# Patient Record
Sex: Male | Born: 1965
Health system: Southern US, Community
[De-identification: ages and names within clinical notes are randomized; demographics above are authoritative.]

## PROBLEM LIST (undated history)

## (undated) ENCOUNTER — Telehealth
Attending: Student in an Organized Health Care Education/Training Program | Primary: Student in an Organized Health Care Education/Training Program

## (undated) ENCOUNTER — Encounter
Attending: Student in an Organized Health Care Education/Training Program | Primary: Student in an Organized Health Care Education/Training Program

## (undated) ENCOUNTER — Telehealth

## (undated) ENCOUNTER — Encounter

## (undated) ENCOUNTER — Ambulatory Visit: Payer: MEDICARE

## (undated) ENCOUNTER — Ambulatory Visit: Attending: Pharmacist | Primary: Pharmacist

## (undated) ENCOUNTER — Ambulatory Visit
Payer: MEDICARE | Attending: Rehabilitative and Restorative Service Providers" | Primary: Rehabilitative and Restorative Service Providers"

## (undated) ENCOUNTER — Ambulatory Visit

## (undated) ENCOUNTER — Inpatient Hospital Stay

## (undated) ENCOUNTER — Ambulatory Visit: Payer: Medicare (Managed Care)

## (undated) ENCOUNTER — Ambulatory Visit
Payer: MEDICARE | Attending: Student in an Organized Health Care Education/Training Program | Primary: Student in an Organized Health Care Education/Training Program

## (undated) ENCOUNTER — Telehealth: Attending: Nurse Practitioner | Primary: Nurse Practitioner

## (undated) ENCOUNTER — Encounter: Attending: Internal Medicine | Primary: Internal Medicine

## (undated) ENCOUNTER — Ambulatory Visit: Payer: MEDICARE | Attending: Family | Primary: Family

## (undated) ENCOUNTER — Ambulatory Visit: Payer: MEDICARE | Attending: Internal Medicine | Primary: Internal Medicine

## (undated) ENCOUNTER — Ambulatory Visit: Attending: Internal Medicine | Primary: Internal Medicine

## (undated) ENCOUNTER — Ambulatory Visit: Payer: MEDICARE | Attending: Neurology | Primary: Neurology

## (undated) DIAGNOSIS — E119 Type 2 diabetes mellitus without complications: Secondary | ICD-10-CM

## (undated) DIAGNOSIS — M1990 Unspecified osteoarthritis, unspecified site: Secondary | ICD-10-CM

## (undated) DIAGNOSIS — I639 Cerebral infarction, unspecified: Secondary | ICD-10-CM

## (undated) DIAGNOSIS — F419 Anxiety disorder, unspecified: Secondary | ICD-10-CM

## (undated) DIAGNOSIS — F32A Depression, unspecified: Secondary | ICD-10-CM

## (undated) HISTORY — DX: Unspecified osteoarthritis, unspecified site: M19.90

## (undated) HISTORY — DX: Cerebral infarction, unspecified: I63.9

## (undated) HISTORY — PX: TIBIA FRACTURE SURGERY: SHX806

## (undated) HISTORY — PX: TONSILLECTOMY: SUR1361

## (undated) HISTORY — DX: Anxiety disorder, unspecified: F41.9

## (undated) HISTORY — DX: Depression, unspecified: F32.A

## (undated) HISTORY — DX: Type 2 diabetes mellitus without complications: E11.9

---

## 2009-06-09 ENCOUNTER — Ambulatory Visit: Payer: Self-pay | Admitting: Internal Medicine

## 2011-10-29 ENCOUNTER — Inpatient Hospital Stay: Payer: Self-pay | Admitting: Psychiatry

## 2011-10-29 LAB — COMPREHENSIVE METABOLIC PANEL
Albumin: 3.8 g/dL (ref 3.4–5.0)
Alkaline Phosphatase: 69 U/L (ref 50–136)
Anion Gap: 8 (ref 7–16)
BUN: 11 mg/dL (ref 7–18)
Bilirubin,Total: 0.4 mg/dL (ref 0.2–1.0)
Calcium, Total: 8.9 mg/dL (ref 8.5–10.1)
Co2: 26 mmol/L (ref 21–32)
Creatinine: 0.96 mg/dL (ref 0.60–1.30)
EGFR (Non-African Amer.): >60
Glucose: 115 mg/dL — ABNORMAL HIGH (ref 65–99)
Osmolality: 274 (ref 275–301)
Potassium: 4 mmol/L (ref 3.5–5.1)
Sodium: 137 mmol/L (ref 136–145)
Total Protein: 7.6 g/dL (ref 6.4–8.2)

## 2011-10-29 LAB — URINALYSIS, COMPLETE
Bacteria: NONE SEEN
Glucose,UR: NEGATIVE mg/dL (ref 0–75)
Ketone: NEGATIVE
Ph: 6 (ref 4.5–8.0)
Specific Gravity: 1.013 (ref 1.003–1.030)
WBC UR: NONE SEEN /HPF (ref 0–5)

## 2011-10-29 LAB — CBC
HCT: 46.2 % (ref 40.0–52.0)
HGB: 16.2 g/dL (ref 13.0–18.0)
MCH: 31.5 pg (ref 26.0–34.0)
MCHC: 35.1 g/dL (ref 32.0–36.0)
MCV: 90 fL (ref 80–100)

## 2011-10-29 LAB — DRUG SCREEN, URINE
Benzodiazepine, Ur Scrn: NEGATIVE (ref ?–200)
Cannabinoid 50 Ng, Ur ~~LOC~~: POSITIVE (ref ?–50)
MDMA (Ecstasy)Ur Screen: NEGATIVE (ref ?–500)
Methadone, Ur Screen: NEGATIVE (ref ?–300)
Phencyclidine (PCP) Ur S: NEGATIVE (ref ?–25)

## 2011-10-29 LAB — ETHANOL
Ethanol %: <0.003 % (ref 0.000–0.080)
Ethanol: <3 mg/dL

## 2011-10-29 LAB — TSH: Thyroid Stimulating Horm: 0.8 u[IU]/mL

## 2013-01-20 DIAGNOSIS — M069 Rheumatoid arthritis, unspecified: Secondary | ICD-10-CM | POA: Diagnosis not present

## 2013-05-29 DIAGNOSIS — M069 Rheumatoid arthritis, unspecified: Secondary | ICD-10-CM | POA: Diagnosis not present

## 2013-05-29 DIAGNOSIS — IMO0002 Reserved for concepts with insufficient information to code with codable children: Secondary | ICD-10-CM | POA: Diagnosis not present

## 2013-05-29 DIAGNOSIS — M75100 Unspecified rotator cuff tear or rupture of unspecified shoulder, not specified as traumatic: Secondary | ICD-10-CM | POA: Diagnosis not present

## 2013-06-02 DIAGNOSIS — Z79899 Other long term (current) drug therapy: Secondary | ICD-10-CM | POA: Diagnosis not present

## 2013-06-02 DIAGNOSIS — M069 Rheumatoid arthritis, unspecified: Secondary | ICD-10-CM | POA: Diagnosis not present

## 2013-06-02 DIAGNOSIS — R5381 Other malaise: Secondary | ICD-10-CM | POA: Diagnosis not present

## 2013-08-19 DIAGNOSIS — M069 Rheumatoid arthritis, unspecified: Secondary | ICD-10-CM | POA: Diagnosis not present

## 2013-08-19 DIAGNOSIS — L738 Other specified follicular disorders: Secondary | ICD-10-CM | POA: Diagnosis not present

## 2013-08-19 DIAGNOSIS — L678 Other hair color and hair shaft abnormalities: Secondary | ICD-10-CM | POA: Diagnosis not present

## 2013-09-12 DIAGNOSIS — A630 Anogenital (venereal) warts: Secondary | ICD-10-CM | POA: Diagnosis not present

## 2013-09-12 DIAGNOSIS — L738 Other specified follicular disorders: Secondary | ICD-10-CM | POA: Diagnosis not present

## 2013-09-12 DIAGNOSIS — L608 Other nail disorders: Secondary | ICD-10-CM | POA: Diagnosis not present

## 2013-09-12 DIAGNOSIS — L678 Other hair color and hair shaft abnormalities: Secondary | ICD-10-CM | POA: Diagnosis not present

## 2014-02-27 DIAGNOSIS — M0579 Rheumatoid arthritis with rheumatoid factor of multiple sites without organ or systems involvement: Secondary | ICD-10-CM | POA: Diagnosis not present

## 2014-03-09 DIAGNOSIS — M159 Polyosteoarthritis, unspecified: Secondary | ICD-10-CM | POA: Diagnosis not present

## 2014-03-09 DIAGNOSIS — M0579 Rheumatoid arthritis with rheumatoid factor of multiple sites without organ or systems involvement: Secondary | ICD-10-CM | POA: Diagnosis not present

## 2014-04-17 DIAGNOSIS — Z79891 Long term (current) use of opiate analgesic: Secondary | ICD-10-CM | POA: Diagnosis not present

## 2014-04-17 DIAGNOSIS — R5383 Other fatigue: Secondary | ICD-10-CM | POA: Diagnosis not present

## 2014-04-17 DIAGNOSIS — M0689 Other specified rheumatoid arthritis, multiple sites: Secondary | ICD-10-CM | POA: Diagnosis not present

## 2014-05-25 DIAGNOSIS — M545 Low back pain: Secondary | ICD-10-CM | POA: Diagnosis not present

## 2014-05-25 DIAGNOSIS — M0689 Other specified rheumatoid arthritis, multiple sites: Secondary | ICD-10-CM | POA: Diagnosis not present

## 2014-05-25 DIAGNOSIS — R5383 Other fatigue: Secondary | ICD-10-CM | POA: Diagnosis not present
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
## 2014-06-02 NOTE — Discharge Summary (Signed)
PATIENT NAME:  Patrick Perez, HELMSTETTER MR#:  [id number] DATE OF BIRTH:  September 11, 1965  DATE OF ADMISSION:  10/29/2011 DATE OF DISCHARGE:  11/01/2011  HOSPITAL COURSE: See dictated history and physical for details of admission.   HISTORY OF PRESENT ILLNESS: This 49 year old man was admitted through the Emergency Room where he presented with suicidal ideation, depression and anxiety, and recent substance abuse. In the hospital, the patient was interactive and participatory on the unit. He did not make any suicidal gestures or engage in any violent behavior. He did not make any threats to anyone. On first evaluation with me, he presented a history that clearly combined heavy recent substance abuse and recurrent substance abuse with symptoms of depression and anxiety. When I began discussing with him longer term treatment for substance abuse, he became very irate and essentially withdrew from treatment. I attempted to reconnect with him and explained to him that while it was clear that he had a substance abuse problem that we wanted to try to treat his mood or anxiety problem if appropriate. He still declined to engage and declined to begin any psychiatric medicine. After the first hospital day, he denied any suicidal ideation. He had poor insight into his substance abuse problem and tended to reject any suggestion about being involved in serious substance abuse treatment. His affect was alternately standoffish and irritable with staff and fairly charming with other patients. The patient did not display any symptoms or signs of psychosis. He appears to have a good ability to take care of himself. Medically he gives a history of rheumatoid arthritis. He was continued on pain medication as he quoted it during the time he was in the hospital. I did check his pain medicine doses with the controlled substance database and confirmed that they appear to be accurate. We do not increase pain medicine or make any other changes to it. At  the time of discharge, the patient denied suicidal or homicidal ideation. He was calm and appropriate in his interaction. He had plans for a place to live and agreed to referral for outpatient counseling and further evaluation.   DISCHARGE MEDICATIONS: No new prescriptions given. He is going to continue with his usual medicine for his rheumatoid arthritis which is Simponi 50 mg intramuscular every 28 days, methotrexate 25 mg intramuscular once a week, and Norco 10 mg three times a day.   DATA RESULTS: Admission labs showed a drug screen positive for cocaine and opiates and cannabis. TSH in the normal range. Alcohol undetectable. Chemistry panel normal except for a glucose slightly elevated at 115. CBC normal. Urinalysis normal. EKG: Normal sinus rhythm him.   MENTAL STATUS EXAM AT DISCHARGE: Neatly dressed and groomed man who looks his stated age. Cooperative with the interview. Made good eye contact. Normal psychomotor activity. Affect tended in general to be still a little bit blunted but not hostile or dysphoric. Mood stated as being better. Thoughts are lucid with no evidence of loosening of associations. Denies suicidal or homicidal ideation. Denies hallucinations. No evidence of delusions. The patient's short and long term memory appear grossly intact. Other than his judgment about his degree of substance abuse problem, his overall judgment and insight appear normal. Intelligence normal.   DISPOSITION: Discharge home. Follow-up appointment made with [facility].  DIAGNOSIS PRINCIPLE AND PRIMARY:   AXIS I: Depression not otherwise specified, rule out substance-induced mood disorder.   SECONDARY DIAGNOSES:   AXIS I: Polysubstance dependence including cocaine, marijuana, possibly opiates and benzodiazepines.   AXIS II: Narcissistic  histrionic traits noted. Very possible he may have a mixed cluster B personality disorder.   AXIS III: Rheumatoid arthritis.   AXIS IV: Moderate chronic stress from  burden of illness and limited resources.   AXIS V: Functioning at time of discharge 60.  ____________________________ Audery Amel, MD jtc:slb D: 11/09/2011 11:32:00 ET     T: 11/10/2011 11:08:37 ET        JOB#: [id number] cc: Audery Amel, MD, <Dictator> Audery Amel MD ELECTRONICALLY SIGNED 11/10/2011 11:28

## 2014-06-05 DIAGNOSIS — M0689 Other specified rheumatoid arthritis, multiple sites: Secondary | ICD-10-CM | POA: Diagnosis not present

## 2014-06-05 DIAGNOSIS — M159 Polyosteoarthritis, unspecified: Secondary | ICD-10-CM | POA: Diagnosis not present

## 2014-06-09 DIAGNOSIS — M0579 Rheumatoid arthritis with rheumatoid factor of multiple sites without organ or systems involvement: Secondary | ICD-10-CM | POA: Diagnosis not present

## 2014-06-09 DIAGNOSIS — M0609 Rheumatoid arthritis without rheumatoid factor, multiple sites: Secondary | ICD-10-CM | POA: Diagnosis not present

## 2014-06-09 DIAGNOSIS — M159 Polyosteoarthritis, unspecified: Secondary | ICD-10-CM | POA: Diagnosis not present

## 2014-08-10 ENCOUNTER — Ambulatory Visit (INDEPENDENT_AMBULATORY_CARE_PROVIDER_SITE_OTHER): Payer: Medicare Other | Admitting: Family Medicine

## 2014-08-10 ENCOUNTER — Encounter (INDEPENDENT_AMBULATORY_CARE_PROVIDER_SITE_OTHER): Payer: Self-pay

## 2014-08-10 ENCOUNTER — Encounter: Payer: Self-pay | Admitting: Family Medicine

## 2014-08-10 VITALS — BP 138/80 | HR 72 | Ht 67.0 in | Wt 181.0 lb

## 2014-08-10 DIAGNOSIS — T148 Other injury of unspecified body region: Secondary | ICD-10-CM | POA: Diagnosis not present

## 2014-08-10 DIAGNOSIS — IMO0002 Reserved for concepts with insufficient information to code with codable children: Secondary | ICD-10-CM

## 2014-08-10 DIAGNOSIS — Z23 Encounter for immunization: Secondary | ICD-10-CM

## 2014-08-10 DIAGNOSIS — S61411A Laceration without foreign body of right hand, initial encounter: Secondary | ICD-10-CM | POA: Diagnosis not present

## 2014-08-10 DIAGNOSIS — R29818 Other symptoms and signs involving the nervous system: Secondary | ICD-10-CM | POA: Diagnosis not present

## 2014-08-10 MED ORDER — TRAMADOL HCL 50 MG PO TABS
50.0000 mg | ORAL_TABLET | Freq: Three times a day (TID) | ORAL | Status: DC | PRN
Start: 2014-08-10 — End: 2016-10-18

## 2014-08-10 MED ORDER — SULFAMETHOXAZOLE-TRIMETHOPRIM 400-80 MG PO TABS
1.0000 | ORAL_TABLET | Freq: Two times a day (BID) | ORAL | Status: DC
Start: 2014-08-10 — End: 2015-02-16

## 2014-08-10 NOTE — Progress Notes (Signed)
Name: Patrick Perez   MRN: [id number]    DOB: 02-07-1966   Date:08/10/2014       Progress Note  Subjective  Chief Complaint  Chief Complaint  Patient presents with  . Hand Injury    glass cut on hand- happened 4 days ago    Hand Injury  The incident occurred 3 to 5 days ago. Incident location: in local establishment. The injury mechanism was a direct blow (by glass shard). The pain is present in the right hand (dorso laterateral 3rd metacarpal). The quality of the pain is described as aching and shooting. The pain is at a severity of 8/10. The pain is moderate. The pain has been fluctuating since the incident. Associated symptoms include muscle weakness, numbness and tingling. Pertinent negatives include no chest pain. The symptoms are aggravated by movement. He has tried nothing for the symptoms. The treatment provided no relief.    No problem-specific assessment & plan notes found for this encounter.   Past Medical History  Diagnosis Date  . Arthritis     rheumatoid    Past Surgical History  Procedure Laterality Date  . Tonsillectomy    . Tibia fracture surgery Left     Family History  Problem Relation Age of Onset  . Diabetes Mother   . Cancer Maternal Grandfather     History   Social History  . Marital Status: Married    Spouse Name: N/A  . Number of Children: N/A  . Years of Education: N/A   Occupational History  . Not on file.   Social History Main Topics  . Smoking status: Current Every Day Smoker  . Smokeless tobacco: Not on file  . Alcohol Use: 0.0 oz/week    0 Standard drinks or equivalent per week  . Drug Use: Yes     Comment: pot  . Sexual Activity: Yes   Other Topics Concern  . Not on file   Social History Narrative  . No narrative on file    No Known Allergies   Review of Systems  Constitutional: Negative for fever, chills, weight loss, malaise/fatigue and diaphoresis.  HENT: Negative for congestion, ear discharge, ear pain, hearing  loss, nosebleeds, sore throat and tinnitus.   Respiratory: Negative for cough, hemoptysis, sputum production, shortness of breath, wheezing and stridor.   Cardiovascular: Negative for chest pain, palpitations, orthopnea, claudication, leg swelling and PND.  Gastrointestinal: Negative for heartburn, nausea, vomiting, abdominal pain and diarrhea.  Musculoskeletal: Positive for joint pain. Negative for myalgias, back pain, falls and neck pain.  Skin: Negative for itching and rash.  Neurological: Positive for tingling, sensory change, focal weakness and numbness. Negative for speech change, seizures, loss of consciousness, weakness and headaches.     Objective  Filed Vitals:   08/10/14 1050  BP: 138/80  Pulse: 72  Height: 5\' 7"  (1.702 m)  Weight: 181 lb (82.101 kg)    Physical Exam  Constitutional: He is oriented to person, place, and time and well-developed, well-nourished, and in no distress.  HENT:  Head: Normocephalic.  Right Ear: External ear normal.  Left Ear: External ear normal.  Nose: Nose normal.  Mouth/Throat: Oropharynx is clear and moist.  Eyes: Conjunctivae and EOM are normal. Pupils are equal, round, and reactive to light. Right eye exhibits no discharge. Left eye exhibits no discharge. No scleral icterus.  Neck: Normal range of motion. Neck supple. No JVD present. No tracheal deviation present. No thyromegaly present.  Cardiovascular: Normal rate, regular rhythm, normal heart sounds and  intact distal pulses.  Exam reveals no gallop and no friction rub.   No murmur heard. Pulmonary/Chest: Breath sounds normal. No respiratory distress. He has no wheezes. He has no rales.  Abdominal: Soft. Bowel sounds are normal. He exhibits no mass. There is no hepatosplenomegaly. There is no tenderness. There is no rebound, no guarding and no CVA tenderness.  Musculoskeletal: Normal range of motion. He exhibits tenderness. He exhibits no edema.       Right hand: He exhibits tenderness  and laceration.       Hands: Lymphadenopathy:    He has no cervical adenopathy.  Neurological: He is alert and oriented to person, place, and time. He has normal sensation, normal strength, normal reflexes and intact cranial nerves. No cranial nerve deficit.  Skin: Skin is warm. No rash noted. No erythema.  Psychiatric: Mood and affect normal.      Assessment & Plan  Problem List Items Addressed This Visit    None    Visit Diagnoses    Laceration    -  Primary    Relevant Medications    sulfamethoxazole-trimethoprim (BACTRIM) 400-80 MG per tablet    Other Relevant Orders    Ambulatory referral to Orthopedic Surgery    Neurologic abnormality        decrease sensory/motor    Relevant Orders    Ambulatory referral to Orthopedic Surgery         Dr. Elizabeth Sauer [facility]  08/10/2014

## 2014-08-18 DIAGNOSIS — F4312 Post-traumatic stress disorder, chronic: Secondary | ICD-10-CM | POA: Diagnosis not present

## 2014-08-24 DIAGNOSIS — F4312 Post-traumatic stress disorder, chronic: Secondary | ICD-10-CM | POA: Diagnosis not present

## 2014-08-31 DIAGNOSIS — F4312 Post-traumatic stress disorder, chronic: Secondary | ICD-10-CM | POA: Diagnosis not present

## 2014-09-02 DIAGNOSIS — M0579 Rheumatoid arthritis with rheumatoid factor of multiple sites without organ or systems involvement: Secondary | ICD-10-CM | POA: Diagnosis not present

## 2014-09-09 DIAGNOSIS — F4312 Post-traumatic stress disorder, chronic: Secondary | ICD-10-CM | POA: Diagnosis not present

## 2014-09-16 DIAGNOSIS — F4312 Post-traumatic stress disorder, chronic: Secondary | ICD-10-CM | POA: Diagnosis not present

## 2014-09-17 DIAGNOSIS — M159 Polyosteoarthritis, unspecified: Secondary | ICD-10-CM | POA: Diagnosis not present

## 2014-09-17 DIAGNOSIS — M0579 Rheumatoid arthritis with rheumatoid factor of multiple sites without organ or systems involvement: Secondary | ICD-10-CM | POA: Diagnosis not present

## 2014-09-23 DIAGNOSIS — F4312 Post-traumatic stress disorder, chronic: Secondary | ICD-10-CM | POA: Diagnosis not present

## 2014-09-25 DIAGNOSIS — F4312 Post-traumatic stress disorder, chronic: Secondary | ICD-10-CM | POA: Diagnosis not present

## 2014-09-30 DIAGNOSIS — F4312 Post-traumatic stress disorder, chronic: Secondary | ICD-10-CM | POA: Diagnosis not present

## 2014-10-01 DIAGNOSIS — M0579 Rheumatoid arthritis with rheumatoid factor of multiple sites without organ or systems involvement: Secondary | ICD-10-CM | POA: Diagnosis not present

## 2014-10-28 DIAGNOSIS — F4312 Post-traumatic stress disorder, chronic: Secondary | ICD-10-CM | POA: Diagnosis not present

## 2014-10-29 DIAGNOSIS — M0579 Rheumatoid arthritis with rheumatoid factor of multiple sites without organ or systems involvement: Secondary | ICD-10-CM | POA: Diagnosis not present

## 2014-11-11 DIAGNOSIS — F4312 Post-traumatic stress disorder, chronic: Secondary | ICD-10-CM | POA: Diagnosis not present

## 2014-11-18 DIAGNOSIS — F4312 Post-traumatic stress disorder, chronic: Secondary | ICD-10-CM | POA: Diagnosis not present

## 2014-11-26 DIAGNOSIS — F4312 Post-traumatic stress disorder, chronic: Secondary | ICD-10-CM | POA: Diagnosis not present

## 2014-11-26 DIAGNOSIS — M0579 Rheumatoid arthritis with rheumatoid factor of multiple sites without organ or systems involvement: Secondary | ICD-10-CM | POA: Diagnosis not present

## 2014-12-15 DIAGNOSIS — F4312 Post-traumatic stress disorder, chronic: Secondary | ICD-10-CM | POA: Diagnosis not present

## 2014-12-23 DIAGNOSIS — F4312 Post-traumatic stress disorder, chronic: Secondary | ICD-10-CM | POA: Diagnosis not present

## 2014-12-30 DIAGNOSIS — M0579 Rheumatoid arthritis with rheumatoid factor of multiple sites without organ or systems involvement: Secondary | ICD-10-CM | POA: Diagnosis not present

## 2014-12-30 DIAGNOSIS — M159 Polyosteoarthritis, unspecified: Secondary | ICD-10-CM | POA: Diagnosis not present

## 2015-01-01 ENCOUNTER — Other Ambulatory Visit: Payer: Self-pay

## 2015-01-06 DIAGNOSIS — F4312 Post-traumatic stress disorder, chronic: Secondary | ICD-10-CM | POA: Diagnosis not present

## 2015-01-13 DIAGNOSIS — F4312 Post-traumatic stress disorder, chronic: Secondary | ICD-10-CM | POA: Diagnosis not present

## 2015-01-27 DIAGNOSIS — M0579 Rheumatoid arthritis with rheumatoid factor of multiple sites without organ or systems involvement: Secondary | ICD-10-CM | POA: Diagnosis not present

## 2015-01-28 ENCOUNTER — Other Ambulatory Visit: Payer: Self-pay

## 2015-02-16 ENCOUNTER — Other Ambulatory Visit
Admission: RE | Admit: 2015-02-16 | Discharge: 2015-02-16 | Disposition: A | Discharge status: Home / Self Care | Payer: Medicare Other | Source: Ambulatory Visit | Attending: Family Medicine | Admitting: Family Medicine

## 2015-02-16 ENCOUNTER — Encounter: Payer: Self-pay | Admitting: Family Medicine

## 2015-02-16 ENCOUNTER — Ambulatory Visit (INDEPENDENT_AMBULATORY_CARE_PROVIDER_SITE_OTHER): Payer: Medicare Other | Admitting: Family Medicine

## 2015-02-16 VITALS — BP 120/60 | HR 80 | Ht 67.0 in | Wt 178.0 lb

## 2015-02-16 DIAGNOSIS — Z114 Encounter for screening for human immunodeficiency virus [HIV]: Secondary | ICD-10-CM | POA: Insufficient documentation

## 2015-02-16 DIAGNOSIS — Z113 Encounter for screening for infections with a predominantly sexual mode of transmission: Secondary | ICD-10-CM

## 2015-02-16 NOTE — Progress Notes (Signed)
Name: Patrick Perez   MRN: [id number]    DOB: 07/29/1965   Date:02/16/2015       Progress Note  Subjective  Chief Complaint  No chief complaint on file.   Rash This is a recurrent problem. The current episode started in the past 7 days. The rash is diffuse. Pertinent negatives include no congestion, cough, diarrhea, fever, joint pain, shortness of breath or sore throat. Past treatments include antibiotics. The treatment provided no relief.    No problem-specific assessment & plan notes found for this encounter.   Past Medical History  Diagnosis Date  . Arthritis     rheumatoid    Past Surgical History  Procedure Laterality Date  . Tonsillectomy    . Tibia fracture surgery Left     Family History  Problem Relation Age of Onset  . Diabetes Mother   . Cancer Maternal Grandfather     Social History   Social History  . Marital Status: Married    Spouse Name: N/A  . Number of Children: N/A  . Years of Education: N/A   Occupational History  . Not on file.   Social History Main Topics  . Smoking status: Current Every Day Smoker  . Smokeless tobacco: Not on file  . Alcohol Use: 0.0 oz/week    0 Standard drinks or equivalent per week  . Drug Use: Yes     Comment: pot  . Sexual Activity: Yes   Other Topics Concern  . Not on file   Social History Narrative    No Known Allergies   Review of Systems  Constitutional: Negative for fever, chills, weight loss and malaise/fatigue.  HENT: Negative for congestion, ear discharge, ear pain and sore throat.   Eyes: Negative for blurred vision.  Respiratory: Negative for cough, sputum production, shortness of breath and wheezing.   Cardiovascular: Negative for chest pain, palpitations and leg swelling.  Gastrointestinal: Negative for heartburn, nausea, abdominal pain, diarrhea, constipation, blood in stool and melena.  Genitourinary: Negative for dysuria, urgency, frequency and hematuria.  Musculoskeletal: Negative for  myalgias, back pain, joint pain and neck pain.  Skin: Positive for rash.  Neurological: Negative for dizziness, tingling, sensory change, focal weakness and headaches.  Endo/Heme/Allergies: Negative for environmental allergies and polydipsia. Does not bruise/bleed easily.  Psychiatric/Behavioral: Negative for depression and suicidal ideas. The patient is not nervous/anxious and does not have insomnia.      Objective  Filed Vitals:   02/16/15 1407  BP: 120/60  Pulse: 80  Height: 5\' 7"  (1.702 m)  Weight: 178 lb (80.74 kg)    Physical Exam  Constitutional: He is oriented to person, place, and time and well-developed, well-nourished, and in no distress.  HENT:  Head: Normocephalic.  Right Ear: External ear normal.  Left Ear: External ear normal.  Nose: Nose normal.  Mouth/Throat: Oropharynx is clear and moist.  Eyes: Conjunctivae and EOM are normal. Pupils are equal, round, and reactive to light. Right eye exhibits no discharge. Left eye exhibits no discharge. No scleral icterus.  Neck: Normal range of motion. Neck supple. No JVD present. No tracheal deviation present. No thyromegaly present.  Cardiovascular: Normal rate, regular rhythm, normal heart sounds and intact distal pulses.  Exam reveals no gallop and no friction rub.   No murmur heard. Pulmonary/Chest: Breath sounds normal. No respiratory distress. He has no wheezes. He has no rales.  Abdominal: Soft. Bowel sounds are normal. He exhibits no mass. There is no hepatosplenomegaly. There is no tenderness. There is no  rebound, no guarding and no CVA tenderness.  Genitourinary: Penis normal. No discharge found.  Musculoskeletal: Normal range of motion. He exhibits no edema or tenderness.  Lymphadenopathy:    He has no cervical adenopathy.  Neurological: He is alert and oriented to person, place, and time. He has normal sensation, normal strength, normal reflexes and intact cranial nerves. No cranial nerve deficit.  Skin: Skin is  warm. No rash noted.  Psychiatric: Mood and affect normal.      Assessment & Plan  Problem List Items Addressed This Visit    None    Visit Diagnoses    Screening for STDs (sexually transmitted diseases)    -  Primary    Encounter for screening for HIV          Pt was sent to lab for HIV, RPR and GC Chlamydia.   Dr. Hayden [facility]  02/16/2015

## 2015-02-17 LAB — MISC LABCORP TEST (SEND OUT): Labcorp test code: 183194

## 2015-02-17 LAB — RPR: RPR Ser Ql: NONREACTIVE

## 2015-02-17 LAB — HIV ANTIBODY (ROUTINE TESTING W REFLEX): HIV Screen 4th Generation wRfx: NONREACTIVE

## 2015-02-24 DIAGNOSIS — F4312 Post-traumatic stress disorder, chronic: Secondary | ICD-10-CM | POA: Diagnosis not present

## 2015-02-25 DIAGNOSIS — M0579 Rheumatoid arthritis with rheumatoid factor of multiple sites without organ or systems involvement: Secondary | ICD-10-CM | POA: Diagnosis not present

## 2015-03-10 DIAGNOSIS — F4312 Post-traumatic stress disorder, chronic: Secondary | ICD-10-CM | POA: Diagnosis not present

## 2015-03-24 DIAGNOSIS — F4312 Post-traumatic stress disorder, chronic: Secondary | ICD-10-CM | POA: Diagnosis not present

## 2015-03-31 DIAGNOSIS — M0579 Rheumatoid arthritis with rheumatoid factor of multiple sites without organ or systems involvement: Secondary | ICD-10-CM | POA: Diagnosis not present

## 2015-03-31 DIAGNOSIS — M159 Polyosteoarthritis, unspecified: Secondary | ICD-10-CM | POA: Diagnosis not present

## 2015-04-14 DIAGNOSIS — F4312 Post-traumatic stress disorder, chronic: Secondary | ICD-10-CM | POA: Diagnosis not present

## 2015-04-28 DIAGNOSIS — F4312 Post-traumatic stress disorder, chronic: Secondary | ICD-10-CM | POA: Diagnosis not present

## 2015-04-28 DIAGNOSIS — M0579 Rheumatoid arthritis with rheumatoid factor of multiple sites without organ or systems involvement: Secondary | ICD-10-CM | POA: Diagnosis not present

## 2015-05-12 DIAGNOSIS — F4312 Post-traumatic stress disorder, chronic: Secondary | ICD-10-CM | POA: Diagnosis not present

## 2015-05-26 DIAGNOSIS — M0579 Rheumatoid arthritis with rheumatoid factor of multiple sites without organ or systems involvement: Secondary | ICD-10-CM | POA: Diagnosis not present

## 2015-05-26 DIAGNOSIS — F4312 Post-traumatic stress disorder, chronic: Secondary | ICD-10-CM | POA: Diagnosis not present

## 2015-06-09 DIAGNOSIS — F4312 Post-traumatic stress disorder, chronic: Secondary | ICD-10-CM | POA: Diagnosis not present

## 2015-06-23 DIAGNOSIS — M159 Polyosteoarthritis, unspecified: Secondary | ICD-10-CM | POA: Diagnosis not present

## 2015-06-23 DIAGNOSIS — M0579 Rheumatoid arthritis with rheumatoid factor of multiple sites without organ or systems involvement: Secondary | ICD-10-CM | POA: Diagnosis not present

## 2015-06-30 DIAGNOSIS — F4312 Post-traumatic stress disorder, chronic: Secondary | ICD-10-CM | POA: Diagnosis not present

## 2015-07-14 DIAGNOSIS — F4312 Post-traumatic stress disorder, chronic: Secondary | ICD-10-CM | POA: Diagnosis not present

## 2015-07-21 DIAGNOSIS — M0579 Rheumatoid arthritis with rheumatoid factor of multiple sites without organ or systems involvement: Secondary | ICD-10-CM | POA: Diagnosis not present

## 2015-07-28 DIAGNOSIS — F4312 Post-traumatic stress disorder, chronic: Secondary | ICD-10-CM | POA: Diagnosis not present

## 2015-08-10 DIAGNOSIS — F4312 Post-traumatic stress disorder, chronic: Secondary | ICD-10-CM | POA: Diagnosis not present

## 2015-08-18 DIAGNOSIS — M0579 Rheumatoid arthritis with rheumatoid factor of multiple sites without organ or systems involvement: Secondary | ICD-10-CM | POA: Diagnosis not present

## 2015-08-25 DIAGNOSIS — F4312 Post-traumatic stress disorder, chronic: Secondary | ICD-10-CM | POA: Diagnosis not present

## 2015-09-08 DIAGNOSIS — F4312 Post-traumatic stress disorder, chronic: Secondary | ICD-10-CM | POA: Diagnosis not present

## 2015-09-13 ENCOUNTER — Encounter: Payer: Self-pay | Admitting: Family Medicine

## 2015-09-13 ENCOUNTER — Ambulatory Visit (INDEPENDENT_AMBULATORY_CARE_PROVIDER_SITE_OTHER): Payer: Medicare Other | Admitting: Family Medicine

## 2015-09-13 VITALS — BP 120/70 | HR 72 | Ht 67.0 in | Wt 182.0 lb

## 2015-09-13 DIAGNOSIS — B350 Tinea barbae and tinea capitis: Secondary | ICD-10-CM | POA: Diagnosis not present

## 2015-09-13 DIAGNOSIS — L739 Follicular disorder, unspecified: Secondary | ICD-10-CM

## 2015-09-13 DIAGNOSIS — M069 Rheumatoid arthritis, unspecified: Secondary | ICD-10-CM | POA: Insufficient documentation

## 2015-09-13 MED ORDER — MUPIROCIN 2 % EX OINT: 1.0000 "application " | TOPICAL_OINTMENT | Freq: Two times a day (BID) | CUTANEOUS | 0 refills | Status: DC

## 2015-09-13 MED ORDER — DOXYCYCLINE HYCLATE 100 MG PO TABS
100.0000 mg | ORAL_TABLET | Freq: Two times a day (BID) | ORAL | 5 refills | Status: DC
Start: 2015-09-13 — End: 2016-08-28

## 2015-09-13 NOTE — Progress Notes (Signed)
Name: Patrick Perez   MRN: [id number]    DOB: 06/11/1965   Date:09/13/2015       Progress Note  Subjective  Chief Complaint  Chief Complaint  Patient presents with  . Rash    folliculitis- spots are coming up all over- "making hair come out"    Rash  This is a recurrent problem. The current episode started more than 1 year ago. The problem has been gradually worsening since onset. The affected locations include the scalp, back, chest, left upper leg, left arm, right arm, left lower leg, right lower leg and right upper leg. The rash is characterized by draining, itchiness and redness. Pertinent negatives include no cough, diarrhea, fever, joint pain, shortness of breath or sore throat. Past treatments include antibiotic cream and anti-itch cream.    No problem-specific Assessment & Plan notes found for this encounter.   Past Medical History:  Diagnosis Date  . Arthritis    rheumatoid    Past Surgical History:  Procedure Laterality Date  . TIBIA FRACTURE SURGERY Left   . TONSILLECTOMY      Family History  Problem Relation Age of Onset  . Diabetes Mother   . Cancer Maternal Grandfather     Social History   Social History  . Marital status: Married    Spouse name: N/A  . Number of children: N/A  . Years of education: N/A   Occupational History  . Not on file.   Social History Main Topics  . Smoking status: Current Every Day Smoker  . Smokeless tobacco: Never Used  . Alcohol use 0.0 oz/week  . Drug use:      Comment: pot  . Sexual activity: Yes   Other Topics Concern  . Not on file   Social History Narrative  . No narrative on file    No Known Allergies   Review of Systems  Constitutional: Negative for chills, fever, malaise/fatigue and weight loss.  HENT: Negative for ear discharge, ear pain and sore throat.   Eyes: Negative for blurred vision.  Respiratory: Negative for cough, sputum production, shortness of breath and wheezing.   Cardiovascular:  Negative for chest pain, palpitations and leg swelling.  Gastrointestinal: Negative for abdominal pain, blood in stool, constipation, diarrhea, heartburn, melena and nausea.  Genitourinary: Negative for dysuria, frequency, hematuria and urgency.  Musculoskeletal: Negative for back pain, joint pain, myalgias and neck pain.  Skin: Positive for rash.       alopecia  Neurological: Negative for dizziness, tingling, sensory change, focal weakness and headaches.  Endo/Heme/Allergies: Negative for environmental allergies and polydipsia. Does not bruise/bleed easily.  Psychiatric/Behavioral: Negative for depression and suicidal ideas. The patient is not nervous/anxious and does not have insomnia.      Objective  Vitals:   09/13/15 0856  BP: 120/70  Pulse: 72  Weight: 182 lb (82.6 kg)  Height: 5\' 7"  (1.702 m)    Physical Exam  Constitutional: He is oriented to person, place, and time and well-developed, well-nourished, and in no distress.  HENT:  Head: Normocephalic.  Right Ear: External ear normal.  Left Ear: External ear normal.  Nose: Nose normal.  Mouth/Throat: Oropharynx is clear and moist.  Eyes: Conjunctivae and EOM are normal. Pupils are equal, round, and reactive to light. Right eye exhibits no discharge. Left eye exhibits no discharge. No scleral icterus.  Neck: Normal range of motion. Neck supple. No JVD present. No tracheal deviation present. No thyromegaly present.  Cardiovascular: Normal rate, regular rhythm, normal heart sounds  and intact distal pulses.  Exam reveals no gallop and no friction rub.   No murmur heard. Pulmonary/Chest: Breath sounds normal. No respiratory distress. He has no wheezes. He has no rales.  Abdominal: Soft. Bowel sounds are normal. He exhibits no mass. There is no hepatosplenomegaly. There is no tenderness. There is no rebound, no guarding and no CVA tenderness.  Musculoskeletal: Normal range of motion. He exhibits no edema or tenderness.   Lymphadenopathy:    He has no cervical adenopathy.  Neurological: He is alert and oriented to person, place, and time. He has normal sensation, normal strength, normal reflexes and intact cranial nerves. No cranial nerve deficit.  Skin: Skin is warm. Rash noted.  Psychiatric: Mood and affect normal.  Nursing note and vitals reviewed.     Assessment & Plan  Problem List Items Addressed This Visit    None    Visit Diagnoses    Tinea capitis    -  Primary   Relevant Medications   mupirocin ointment (BACTROBAN) 2 %   Other Relevant Orders   Ambulatory referral to Dermatology   Folliculitis       Relevant Medications   doxycycline (VIBRA-TABS) 100 MG tablet   Other Relevant Orders   Ambulatory referral to Dermatology        Dr. Elizabeth Sauer [facility]  09/13/15

## 2015-09-15 DIAGNOSIS — M150 Primary generalized (osteo)arthritis: Secondary | ICD-10-CM | POA: Diagnosis not present

## 2015-09-15 DIAGNOSIS — M0579 Rheumatoid arthritis with rheumatoid factor of multiple sites without organ or systems involvement: Secondary | ICD-10-CM | POA: Diagnosis not present

## 2015-10-06 DIAGNOSIS — F4312 Post-traumatic stress disorder, chronic: Secondary | ICD-10-CM | POA: Diagnosis not present

## 2015-10-20 DIAGNOSIS — F4312 Post-traumatic stress disorder, chronic: Secondary | ICD-10-CM | POA: Diagnosis not present

## 2015-11-24 DIAGNOSIS — F4312 Post-traumatic stress disorder, chronic: Secondary | ICD-10-CM | POA: Diagnosis not present

## 2015-12-06 DIAGNOSIS — Z23 Encounter for immunization: Secondary | ICD-10-CM | POA: Diagnosis not present

## 2015-12-08 DIAGNOSIS — F4312 Post-traumatic stress disorder, chronic: Secondary | ICD-10-CM | POA: Diagnosis not present

## 2015-12-10 DIAGNOSIS — M0579 Rheumatoid arthritis with rheumatoid factor of multiple sites without organ or systems involvement: Secondary | ICD-10-CM | POA: Diagnosis not present

## 2015-12-22 DIAGNOSIS — F4312 Post-traumatic stress disorder, chronic: Secondary | ICD-10-CM | POA: Diagnosis not present

## 2016-01-12 DIAGNOSIS — F4312 Post-traumatic stress disorder, chronic: Secondary | ICD-10-CM | POA: Diagnosis not present

## 2016-02-02 DIAGNOSIS — M150 Primary generalized (osteo)arthritis: Secondary | ICD-10-CM | POA: Diagnosis not present

## 2016-02-02 DIAGNOSIS — M0579 Rheumatoid arthritis with rheumatoid factor of multiple sites without organ or systems involvement: Secondary | ICD-10-CM | POA: Diagnosis not present

## 2016-03-08 DIAGNOSIS — F4312 Post-traumatic stress disorder, chronic: Secondary | ICD-10-CM | POA: Diagnosis not present

## 2016-03-21 DIAGNOSIS — M0579 Rheumatoid arthritis with rheumatoid factor of multiple sites without organ or systems involvement: Secondary | ICD-10-CM | POA: Diagnosis not present

## 2016-03-22 DIAGNOSIS — F4312 Post-traumatic stress disorder, chronic: Secondary | ICD-10-CM | POA: Diagnosis not present

## 2016-04-05 DIAGNOSIS — F4312 Post-traumatic stress disorder, chronic: Secondary | ICD-10-CM | POA: Diagnosis not present

## 2016-04-19 DIAGNOSIS — F4312 Post-traumatic stress disorder, chronic: Secondary | ICD-10-CM | POA: Diagnosis not present

## 2016-05-03 DIAGNOSIS — F4312 Post-traumatic stress disorder, chronic: Secondary | ICD-10-CM | POA: Diagnosis not present

## 2016-05-18 DIAGNOSIS — M150 Primary generalized (osteo)arthritis: Secondary | ICD-10-CM | POA: Diagnosis not present

## 2016-05-18 DIAGNOSIS — G4700 Insomnia, unspecified: Secondary | ICD-10-CM | POA: Diagnosis not present

## 2016-05-18 DIAGNOSIS — M0579 Rheumatoid arthritis with rheumatoid factor of multiple sites without organ or systems involvement: Secondary | ICD-10-CM | POA: Diagnosis not present

## 2016-05-18 DIAGNOSIS — M069 Rheumatoid arthritis, unspecified: Secondary | ICD-10-CM | POA: Diagnosis not present

## 2016-05-24 DIAGNOSIS — F4312 Post-traumatic stress disorder, chronic: Secondary | ICD-10-CM | POA: Diagnosis not present

## 2016-06-07 DIAGNOSIS — F4312 Post-traumatic stress disorder, chronic: Secondary | ICD-10-CM | POA: Diagnosis not present

## 2016-06-21 DIAGNOSIS — F4312 Post-traumatic stress disorder, chronic: Secondary | ICD-10-CM | POA: Diagnosis not present

## 2016-07-26 DIAGNOSIS — F4312 Post-traumatic stress disorder, chronic: Secondary | ICD-10-CM | POA: Diagnosis not present

## 2016-08-09 DIAGNOSIS — F4312 Post-traumatic stress disorder, chronic: Secondary | ICD-10-CM | POA: Diagnosis not present

## 2016-08-17 ENCOUNTER — Other Ambulatory Visit: Payer: Self-pay

## 2016-08-17 ENCOUNTER — Telehealth: Payer: Self-pay

## 2016-08-17 DIAGNOSIS — M150 Primary generalized (osteo)arthritis: Secondary | ICD-10-CM | POA: Diagnosis not present

## 2016-08-17 DIAGNOSIS — M0579 Rheumatoid arthritis with rheumatoid factor of multiple sites without organ or systems involvement: Secondary | ICD-10-CM | POA: Diagnosis not present

## 2016-08-17 MED ORDER — MUPIROCIN 2 % EX OINT: 1.0000 "application " | TOPICAL_OINTMENT | Freq: Two times a day (BID) | CUTANEOUS | 0 refills | Status: DC

## 2016-08-17 NOTE — Telephone Encounter (Signed)
Pt came in wanting a refill on Bactroban. Was told we would send into [facility] [place] but pt would need to schedule appt due to not being seen in a year

## 2016-08-23 DIAGNOSIS — F4312 Post-traumatic stress disorder, chronic: Secondary | ICD-10-CM | POA: Diagnosis not present

## 2016-08-28 ENCOUNTER — Ambulatory Visit (INDEPENDENT_AMBULATORY_CARE_PROVIDER_SITE_OTHER): Payer: Medicare Other

## 2016-08-28 VITALS — BP 120/80 | HR 68 | Temp 99.3°F | Resp 16 | Ht 67.0 in | Wt 190.2 lb

## 2016-08-28 DIAGNOSIS — Z0000 Encounter for general adult medical examination without abnormal findings: Secondary | ICD-10-CM

## 2016-08-28 NOTE — Patient Instructions (Signed)
Patrick Perez , Thank you for taking time to come for your Medicare Wellness Visit. I appreciate your ongoing commitment to your health goals. Please review the following plan we discussed and let me know if I can assist you in the future.   Screening recommendations/referrals: Colonoscopy: Due at age 50 Recommended yearly ophthalmology/optometry visit for glaucoma screening and checkup Recommended yearly dental visit for hygiene and checkup  Vaccinations: Influenza vaccine: Due 10/2016 Pneumococcal vaccine: Due at age 42 Tdap vaccine: up to date Shingles vaccine: Due at age 64  Advanced directives: declined information   Conditions/risks identified: Smoking cessation discussed  Next appointment: Follow up on 10/18/2016 at 9:30am with Dr.Jones. Follow up in one year for your annual wellness exam  Preventive Care 40-64 Years, Male Preventive care refers to lifestyle choices and visits with your health care provider that can promote health and wellness. What does preventive care include?  A yearly physical exam. This is also called an annual well check.  Dental exams once or twice a year.  Routine eye exams. Ask your health care provider how often you should have your eyes checked.  Personal lifestyle choices, including:  Daily care of your teeth and gums.  Regular physical activity.  Eating a healthy diet.  Avoiding tobacco and drug use.  Limiting alcohol use.  Practicing safe sex.  Taking low-dose aspirin every day starting at age 20. What happens during an annual well check? The services and screenings done by your health care provider during your annual well check will depend on your age, overall health, lifestyle risk factors, and family history of disease. Counseling  Your health care provider may ask you questions about your:  Alcohol use.  Tobacco use.  Drug use.  Emotional well-being.  Home and relationship well-being.  Sexual activity.  Eating  habits.  Work and work Astronomer. Screening  You may have the following tests or measurements:  Height, weight, and BMI.  Blood pressure.  Lipid and cholesterol levels. These may be checked every 5 years, or more frequently if you are over 52 years old.  Skin check.  Lung cancer screening. You may have this screening every year starting at age 59 if you have a 30-pack-year history of smoking and currently smoke or have quit within the past 15 years.  Fecal occult blood test (FOBT) of the stool. You may have this test every year starting at age 38.  Flexible sigmoidoscopy or colonoscopy. You may have a sigmoidoscopy every 5 years or a colonoscopy every 10 years starting at age 72.  Prostate cancer screening. Recommendations will vary depending on your family history and other risks.  Hepatitis C blood test.  Hepatitis B blood test.  Sexually transmitted disease (STD) testing.  Diabetes screening. This is done by checking your blood sugar (glucose) after you have not eaten for a while (fasting). You may have this done every 1-3 years. Discuss your test results, treatment options, and if necessary, the need for more tests with your health care provider. Vaccines  Your health care provider may recommend certain vaccines, such as:  Influenza vaccine. This is recommended every year.  Tetanus, diphtheria, and acellular pertussis (Tdap, Td) vaccine. You may need a Td booster every 10 years.  Zoster vaccine. You may need this after age 26.  Pneumococcal 13-valent conjugate (PCV13) vaccine. You may need this if you have certain conditions and have not been vaccinated.  Pneumococcal polysaccharide (PPSV23) vaccine. You may need one or two doses if you smoke cigarettes  or if you have certain conditions. Talk to your health care provider about which screenings and vaccines you need and how often you need them. This information is not intended to replace advice given to you by your  health care provider. Make sure you discuss any questions you have with your health care provider. Document Released: 02/26/2015 Document Revised: 10/20/2015 Document Reviewed: 12/01/2014 Elsevier Interactive Patient Education  2017 [employer].  Fall Prevention in the Home Falls can cause injuries. They can happen to people of all ages. There are many things you can do to make your home safe and to help prevent falls. What can I do on the outside of my home?  Regularly fix the edges of walkways and driveways and fix any cracks.  Remove anything that might make you trip as you walk through a door, such as a raised step or threshold.  Trim any bushes or trees on the path to your home.  Use bright outdoor lighting.  Clear any walking paths of anything that might make someone trip, such as rocks or tools.  Regularly check to see if handrails are loose or broken. Make sure that both sides of any steps have handrails.  Any raised decks and porches should have guardrails on the edges.  Have any leaves, snow, or ice cleared regularly.  Use sand or salt on walking paths during winter.  Clean up any spills in your garage right away. This includes oil or grease spills. What can I do in the bathroom?  Use night lights.  Install grab bars by the toilet and in the tub and shower. Do not use towel bars as grab bars.  Use non-skid mats or decals in the tub or shower.  If you need to sit down in the shower, use a plastic, non-slip stool.  Keep the floor dry. Clean up any water that spills on the floor as soon as it happens.  Remove soap buildup in the tub or shower regularly.  Attach bath mats securely with double-sided non-slip rug tape.  Do not have throw rugs and other things on the floor that can make you trip. What can I do in the bedroom?  Use night lights.  Make sure that you have a light by your bed that is easy to reach.  Do not use any sheets or blankets that are too big  for your bed. They should not hang down onto the floor.  Have a firm chair that has side arms. You can use this for support while you get dressed.  Do not have throw rugs and other things on the floor that can make you trip. What can I do in the kitchen?  Clean up any spills right away.  Avoid walking on wet floors.  Keep items that you use a lot in easy-to-reach places.  If you need to reach something above you, use a strong step stool that has a grab bar.  Keep electrical cords out of the way.  Do not use floor polish or wax that makes floors slippery. If you must use wax, use non-skid floor wax.  Do not have throw rugs and other things on the floor that can make you trip. What can I do with my stairs?  Do not leave any items on the stairs.  Make sure that there are handrails on both sides of the stairs and use them. Fix handrails that are broken or loose. Make sure that handrails are as long as the stairways.  Check any carpeting to make sure that it is firmly attached to the stairs. Fix any carpet that is loose or worn.  Avoid having throw rugs at the top or bottom of the stairs. If you do have throw rugs, attach them to the floor with carpet tape.  Make sure that you have a light switch at the top of the stairs and the bottom of the stairs. If you do not have them, ask someone to add them for you. What else can I do to help prevent falls?  Wear shoes that:  Do not have high heels.  Have rubber bottoms.  Are comfortable and fit you well.  Are closed at the toe. Do not wear sandals.  If you use a stepladder:  Make sure that it is fully opened. Do not climb a closed stepladder.  Make sure that both sides of the stepladder are locked into place.  Ask someone to hold it for you, if possible.  Clearly mark and make sure that you can see:  Any grab bars or handrails.  First and last steps.  Where the edge of each step is.  Use tools that help you move around  (mobility aids) if they are needed. These include:  Canes.  Walkers.  Scooters.  Crutches.  Turn on the lights when you go into a dark area. Replace any light bulbs as soon as they burn out.  Set up your furniture so you have a clear path. Avoid moving your furniture around.  If any of your floors are uneven, fix them.  If there are any pets around you, be aware of where they are.  Review your medicines with your doctor. Some medicines can make you feel dizzy. This can increase your chance of falling. Ask your doctor what other things that you can do to help prevent falls. This information is not intended to replace advice given to you by your health care provider. Make sure you discuss any questions you have with your health care provider. Document Released: 11/26/2008 Document Revised: 07/08/2015 Document Reviewed: 03/06/2014 Elsevier Interactive Patient Education  2017 [employer].

## 2016-08-28 NOTE — Progress Notes (Signed)
Subjective:   Patrick Perez is a 50 y.o. male who presents for an Initial Medicare Annual Wellness Visit.  Review of Systems   Cardiac Risk Factors include: male gender;smoking/ tobacco exposure    Objective:    Today's Vitals   08/28/16 0913  BP: 120/80  Pulse: 68  Resp: 16  Weight: 190 lb 3.2 oz (86.3 kg)  Height: 5\' 7"  (1.702 m)   Body mass index is 29.79 kg/m.  Current Medications (verified) Outpatient Encounter Prescriptions as of 08/28/2016  Medication Sig  . Cyanocobalamin (B-12) 100 MCG TABS B12  . cyclobenzaprine (FLEXERIL) 10 MG tablet Take 1 tablet by mouth 3 (three) times daily. Dr 08/30/2016  . diclofenac (CATAFLAM) 50 MG tablet Take 50 mg by mouth 2 (two) times daily with a meal. Reported on 02/16/2015  . folic acid (FOLVITE) 1 MG tablet Take 1 mg by mouth.  . Golimumab 50 MG/0.5ML SOAJ Inject 50 mg into the skin.  . methotrexate 50 MG/2ML injection Inject 1 mL into the muscle once a week. Dr 04/16/2015  . mupirocin ointment (BACTROBAN) 2 % Apply 1 application topically 2 (two) times daily.  . Naproxen Sodium (ALEVE) 220 MG CAPS Aleve 220 mg capsule  prn  . Omega-3 Fatty Acids (FISH OIL) 1000 MG CAPS Fish Oil  . traMADol (ULTRAM) 50 MG tablet Take 1 tablet (50 mg total) by mouth every 8 (eight) hours as needed. (Patient taking differently: Take 50 mg by mouth every 8 (eight) hours as needed. Dr Alfredo Batty)  . Vitamin D, Cholecalciferol, 400 units TABS Take 1 tablet by mouth 1 day or 1 dose.  . [DISCONTINUED] Cholecalciferol (VITAMIN D3) 2000 units capsule Vitamin D3  . [DISCONTINUED] doxycycline (VIBRA-TABS) 100 MG tablet Take 1 tablet (100 mg total) by mouth 2 (two) times daily. 1 twice a day for 10 days then 1 a day forafterwards (Patient not taking: Reported on 08/28/2016)  . [DISCONTINUED] ORENCIA 250 MG injection Dr 08/30/2016   No facility-administered encounter medications on file as of 08/28/2016.     Allergies (verified) Patient has no known allergies.    History: Past Medical History:  Diagnosis Date  . Arthritis    rheumatoid   Past Surgical History:  Procedure Laterality Date  . TIBIA FRACTURE SURGERY Left   . TONSILLECTOMY     Family History  Problem Relation Age of Onset  . Diabetes Mother   . Lung cancer Maternal Grandfather    Social History   Occupational History  . Not on file.   Social History Main Topics  . Smoking status: Current Every Day Smoker    Packs/day: 0.50    Types: Cigarettes  . Smokeless tobacco: Never Used  . Alcohol use 2.4 oz/week    4 Cans of beer per week  . Drug use: Yes    Frequency: 7.0 times per week    Types: Marijuana     Comment: smokes once a day   . Sexual activity: Yes   Tobacco Counseling Ready to quit: No Counseling given: Yes   Activities of Daily Living In your present state of health, do you have any difficulty performing the following activities: 08/28/2016  Hearing? N  Vision? N  Difficulty concentrating or making decisions? N  Walking or climbing stairs? Y  Dressing or bathing? N  Doing errands, shopping? N  Preparing Food and eating ? N  Using the Toilet? N  In the past six months, have you accidently leaked urine? N  Do you have problems with  loss of bowel control? N  Managing your Medications? N  Managing your Finances? N  Housekeeping or managing your Housekeeping? N  Some recent data might be hidden    Immunizations and Health Maintenance Immunization History  Administered Date(s) Administered  . Tdap 08/10/2014   There are no preventive care reminders to display for this patient.  Patient Care Team: Duanne Limerick, MD as PCP - General (Family Medicine) Alfredo Batty, Bonita Quin, MD as Referring Physician (Specialist)  Indicate any recent Medical Services you may have received from other than [facility] providers in the past year (date may be approximate).    Assessment:   This is a routine wellness examination for Patrick Perez.   Hearing/Vision screen Vision  Screening Comments: Doesn't see eye doctor annually   Dietary issues and exercise activities discussed: Current Exercise Habits: The patient does not participate in regular exercise at present, Exercise limited by: None identified  Goals    . Quit smoking / using tobacco          Smoking cessation discussed      Depression Screen PHQ 2/9 Scores 08/28/2016  PHQ - 2 Score 0    Fall Risk Fall Risk  08/28/2016  Falls in the past year? No    Cognitive Function:     6CIT Screen 08/28/2016  What Year? 0 points  What month? 0 points  What time? 0 points  Count back from 20 0 points  Months in reverse 0 points  Repeat phrase 0 points  Total Score 0    Screening Tests Health Maintenance  Topic Date Due  . INFLUENZA VACCINE  09/13/2016  . TETANUS/TDAP  08/09/2024  . HIV Screening  Completed        Plan:    I have personally reviewed and addressed the Medicare Annual Wellness questionnaire and have noted the following in the patient's chart:  A. Medical and social history B. Use of alcohol, tobacco or illicit drugs  C. Current medications and supplements D. Functional ability and status E.  Nutritional status F.  Physical activity G. Advance directives H. List of other physicians I.  Hospitalizations, surgeries, and ER visits in previous 12 months J.  Vitals K. Screenings such as hearing and vision if needed, cognitive and depression L. Referrals and appointments  In addition, I have reviewed and discussed with patient certain preventive protocols, quality metrics, and best practice recommendations. A written personalized care plan for preventive services as well as general preventive health recommendations were provided to patient.   Signed,  Marin Roberts, LPN Nurse Health Advisor   MD Recommendations: none

## 2016-09-06 DIAGNOSIS — F4312 Post-traumatic stress disorder, chronic: Secondary | ICD-10-CM | POA: Diagnosis not present

## 2016-09-20 DIAGNOSIS — F4312 Post-traumatic stress disorder, chronic: Secondary | ICD-10-CM | POA: Diagnosis not present

## 2016-10-04 DIAGNOSIS — F4312 Post-traumatic stress disorder, chronic: Secondary | ICD-10-CM | POA: Diagnosis not present

## 2016-10-18 ENCOUNTER — Telehealth: Payer: Self-pay

## 2016-10-18 ENCOUNTER — Ambulatory Visit (INDEPENDENT_AMBULATORY_CARE_PROVIDER_SITE_OTHER): Payer: Medicare Other | Admitting: Family Medicine

## 2016-10-18 ENCOUNTER — Encounter: Payer: Self-pay | Admitting: Family Medicine

## 2016-10-18 VITALS — BP 120/86 | HR 80 | Ht 67.0 in | Wt 186.0 lb

## 2016-10-18 DIAGNOSIS — R69 Illness, unspecified: Secondary | ICD-10-CM | POA: Diagnosis not present

## 2016-10-18 DIAGNOSIS — E785 Hyperlipidemia, unspecified: Secondary | ICD-10-CM | POA: Diagnosis not present

## 2016-10-18 DIAGNOSIS — J40 Bronchitis, not specified as acute or chronic: Secondary | ICD-10-CM

## 2016-10-18 DIAGNOSIS — Z23 Encounter for immunization: Secondary | ICD-10-CM | POA: Diagnosis not present

## 2016-10-18 DIAGNOSIS — E663 Overweight: Secondary | ICD-10-CM

## 2016-10-18 DIAGNOSIS — Z0000 Encounter for general adult medical examination without abnormal findings: Secondary | ICD-10-CM | POA: Diagnosis not present

## 2016-10-18 LAB — HEMOCCULT GUIAC POC 1CARD (OFFICE): FECAL OCCULT BLD: NEGATIVE

## 2016-10-18 MED ORDER — AMOXICILLIN-POT CLAVULANATE 875-125 MG PO TABS
1.0000 | ORAL_TABLET | Freq: Two times a day (BID) | ORAL | 0 refills | Status: DC
Start: 2016-10-18 — End: 2017-03-28

## 2016-10-18 NOTE — Telephone Encounter (Signed)
Pharmacy called and Fleet Contras is concerned with Drug Interactions in Augmentin and regular meds Call [phone] please.

## 2016-10-18 NOTE — Progress Notes (Signed)
Name: Patrick Perez   MRN: [id number]    DOB: 04/29/1965   Date:10/18/2016       Progress Note  Subjective  Chief Complaint  Chief Complaint  Patient presents with  . Annual Exam    no colonoscopy/ wants a flu shot  . Sinusitis    coughing up light green production. Hx of bronchitis.    Sinusitis  This is a new problem. The current episode started in the past 7 days. The problem is unchanged. There has been no fever. Associated symptoms include coughing and sinus pressure. Pertinent negatives include no chills, congestion, diaphoresis, ear pain, headaches, hoarse voice, neck pain, shortness of breath, sneezing, sore throat or swollen glands. Past treatments include nothing. The treatment provided moderate relief.    No problem-specific Assessment & Plan notes found for this encounter.   Past Medical History:  Diagnosis Date  . Arthritis    rheumatoid    Past Surgical History:  Procedure Laterality Date  . TIBIA FRACTURE SURGERY Left   . TONSILLECTOMY      Family History  Problem Relation Age of Onset  . Diabetes Mother   . Lung cancer Maternal Grandfather     Social History   Social History  . Marital status: Married    Spouse name: N/A  . Number of children: N/A  . Years of education: N/A   Occupational History  . Not on file.   Social History Main Topics  . Smoking status: Current Every Day Smoker    Packs/day: 0.50    Types: Cigarettes  . Smokeless tobacco: Never Used  . Alcohol use 2.4 oz/week    4 Cans of beer per week  . Drug use: Yes    Frequency: 7.0 times per week    Types: Marijuana     Comment: smokes once a day   . Sexual activity: Yes   Other Topics Concern  . Not on file   Social History Narrative  . No narrative on file    No Known Allergies  Outpatient Medications Prior to Visit  Medication Sig Dispense Refill  . Cyanocobalamin (B-12) 100 MCG TABS B12    . cyclobenzaprine (FLEXERIL) 10 MG tablet Take 1 tablet by mouth 3 (three)  times daily. Dr Alfredo Batty  5  . folic acid (FOLVITE) 1 MG tablet Take 1 mg by mouth.    . Golimumab 50 MG/0.5ML SOAJ Inject 50 mg into the skin.    . methotrexate 50 MG/2ML injection Inject 1 mL into the muscle once a week. Dr Alfredo Batty  0  . mupirocin ointment (BACTROBAN) 2 % Apply 1 application topically 2 (two) times daily. 22 g 0  . Naproxen Sodium (ALEVE) 220 MG CAPS Aleve 220 mg capsule  prn    . Omega-3 Fatty Acids (FISH OIL) 1000 MG CAPS Fish Oil    . Vitamin D, Cholecalciferol, 400 units TABS Take 1 tablet by mouth 1 day or 1 dose.    . diclofenac (CATAFLAM) 50 MG tablet Take 50 mg by mouth 2 (two) times daily with a meal. Reported on 02/16/2015  5  . traMADol (ULTRAM) 50 MG tablet Take 1 tablet (50 mg total) by mouth every 8 (eight) hours as needed. (Patient taking differently: Take 50 mg by mouth every 8 (eight) hours as needed. Dr Alfredo Batty) 30 tablet 0   No facility-administered medications prior to visit.     Review of Systems  Constitutional: Negative for chills, diaphoresis, fever, malaise/fatigue and weight loss.  HENT: Positive for  sinus pressure. Negative for congestion, ear discharge, ear pain, hoarse voice, sneezing and sore throat.   Eyes: Negative for blurred vision.  Respiratory: Positive for cough and sputum production. Negative for hemoptysis, shortness of breath and wheezing.   Cardiovascular: Negative for chest pain, palpitations and leg swelling.  Gastrointestinal: Negative for abdominal pain, blood in stool, constipation, diarrhea, heartburn, melena and nausea.  Genitourinary: Negative for dysuria, frequency, hematuria and urgency.  Musculoskeletal: Negative for back pain, joint pain, myalgias and neck pain.  Skin: Negative for rash.  Neurological: Negative for dizziness, tingling, sensory change, focal weakness and headaches.  Endo/Heme/Allergies: Negative for environmental allergies and polydipsia. Does not bruise/bleed easily.  Psychiatric/Behavioral: Negative for  depression and suicidal ideas. The patient is not nervous/anxious and does not have insomnia.      Objective  Vitals:   10/18/16 0951  BP: 120/86  Pulse: 80  Weight: 186 lb (84.4 kg)  Height: 5\' 7"  (1.702 m)    Physical Exam  Constitutional: He is oriented to person, place, and time and well-developed, well-nourished, and in no distress. Vital signs are normal.  HENT:  Head: Normocephalic.  Right Ear: Tympanic membrane, external ear and ear canal normal.  Left Ear: Tympanic membrane, external ear and ear canal normal.  Nose: Nose normal.  Mouth/Throat: Uvula is midline, oropharynx is clear and moist and mucous membranes are normal. No oropharyngeal exudate, posterior oropharyngeal edema, posterior oropharyngeal erythema or tonsillar abscesses.  Eyes: Pupils are equal, round, and reactive to light. Conjunctivae, EOM and lids are normal. Right eye exhibits no discharge. Left eye exhibits no discharge. No scleral icterus.  Fundoscopic exam:      The right eye shows no arteriolar narrowing and no AV nicking.       The left eye shows no arteriolar narrowing and no AV nicking.  Neck: Trachea normal, normal range of motion and full passive range of motion without pain. Neck supple. Normal carotid pulses, no hepatojugular reflux and no JVD present. No tracheal tenderness present. Carotid bruit is not present. No neck rigidity. No tracheal deviation, no edema, no erythema and normal range of motion present. No thyromegaly present.  Cardiovascular: Normal rate, regular rhythm, S1 normal, S2 normal, normal heart sounds, intact distal pulses and normal pulses.  Exam reveals no gallop, no S3, no S4, no distant heart sounds, no friction rub and no decreased pulses.   No murmur heard. Pulmonary/Chest: Effort normal and breath sounds normal. No stridor. No respiratory distress. He has no wheezes. He has no rales. Right breast exhibits no mass. Left breast exhibits no mass.  Abdominal: Soft. Normal  aorta and bowel sounds are normal. He exhibits no mass. There is no hepatosplenomegaly. There is no tenderness. There is no rebound, no guarding and no CVA tenderness.  Genitourinary: Rectum normal, prostate normal, testes/scrotum normal and penis normal.  Musculoskeletal: Normal range of motion. He exhibits no edema or tenderness.       Cervical back: Normal.       Lumbar back: Normal.  Lymphadenopathy:       Head (right side): No submental and no submandibular adenopathy present.       Head (left side): No submental and no submandibular adenopathy present.    He has no cervical adenopathy.    He has no axillary adenopathy.  Neurological: He is alert and oriented to person, place, and time. He has normal motor skills, normal sensation, normal strength, normal reflexes and intact cranial nerves. No cranial nerve deficit.  Skin: Skin  is warm and intact. No rash noted.  Psychiatric: Mood and affect normal.  Nursing note and vitals reviewed.     Assessment & Plan  Problem List Items Addressed This Visit    None    Visit Diagnoses    Annual physical exam    -  Primary   Relevant Orders   POCT occult blood stool (Completed)   Renal Function Panel   Bronchitis       Relevant Medications   amoxicillin-clavulanate (AUGMENTIN) 875-125 MG tablet   Hyperlipidemia, unspecified hyperlipidemia type       Relevant Orders   Lipid Profile   Overweight (BMI 25.0-29.9)       Relevant Orders   Lipid Profile   Taking medication for chronic disease       Relevant Orders   Renal Function Panel   Influenza vaccine needed       Relevant Orders   Flu Vaccine QUAD 36+ mos IM (Completed)      Meds ordered this encounter  Medications  . amoxicillin-clavulanate (AUGMENTIN) 875-125 MG tablet    Sig: Take 1 tablet by mouth 2 (two) times daily.    Dispense:  20 tablet    Refill:  0      Dr. Hayden [facility]  10/18/16

## 2016-10-18 NOTE — Patient Instructions (Signed)
Health Risks of Smoking Smoking cigarettes is very bad for your health. Tobacco smoke has over 200 known poisons in it. It contains the poisonous gases nitrogen oxide and carbon monoxide. There are over 60 chemicals in tobacco smoke that cause cancer. Smoking is difficult to quit because a chemical in tobacco, called nicotine, causes addiction or dependence. When you smoke and inhale, nicotine is absorbed rapidly into the bloodstream through your lungs. Both inhaled and non-inhaled nicotine may be addictive. What are the risks of cigarette smoke? Cigarette smokers have an increased risk of many serious medical problems, including:  Lung cancer.  Lung disease, such as pneumonia, bronchitis, and emphysema.  Chest pain (angina) and heart attack because the heart is not getting enough oxygen.  Heart disease and peripheral blood vessel disease.  High blood pressure (hypertension).  Stroke.  Oral cancer, including cancer of the lip, mouth, or voice box.  Bladder cancer.  Pancreatic cancer.  Cervical cancer.  Pregnancy complications, including premature birth.  Stillbirths and smaller newborn babies, birth defects, and genetic damage to sperm.  Early menopause.  Lower estrogen level for women.  Infertility.  Facial wrinkles.  Blindness.  Increased risk of broken bones (fractures).  Senile dementia.  Stomach ulcers and internal bleeding.  Delayed wound healing and increased risk of complications during surgery.  Even smoking lightly shortens your life expectancy by several years.  Because of secondhand smoke exposure, children of smokers have an increased risk of the following:  Sudden infant death syndrome (SIDS).  Respiratory infections.  Lung cancer.  Heart disease.  Ear infections.  What are the benefits of quitting? There are many health benefits of quitting smoking. Here are some of them:  Within days of quitting smoking, your risk of having a heart  attack decreases, your blood flow improves, and your lung capacity improves. Blood pressure, pulse rate, and breathing patterns start returning to normal soon after quitting.  Within months, your lungs may clear up completely.  Quitting for 10 years reduces your risk of developing lung cancer and heart disease to almost that of a nonsmoker.  People who quit may see an improvement in their overall quality of life.  How do I quit smoking? Smoking is an addiction with both physical and psychological effects, and longtime habits can be hard to change. Your health care provider can recommend:  Programs and community resources, which may include group support, education, or talk therapy.  Prescription medicines to help reduce cravings.  Nicotine replacement products, such as patches, gum, and nasal sprays. Use these products only as directed. Do not replace cigarette smoking with electronic cigarettes, which are commonly called e-cigarettes. The safety of e-cigarettes is not known, and some may contain harmful chemicals.  A combination of two or more of these methods.  Where to find more information:  American Lung Association: www.lung.org  American Cancer Society: www.cancer.org Summary  Smoking cigarettes is very bad for your health. Cigarette smokers have an increased risk of many serious medical problems, including several cancers, heart disease, and stroke.  Smoking is an addiction with both physical and psychological effects, and longtime habits can be hard to change.  By stopping right away, you can greatly reduce the risk of medical problems for you and your family.  To help you quit smoking, your health care provider can recommend programs, community resources, prescription medicines, and nicotine replacement products such as patches, gum, and nasal sprays. This information is not intended to replace advice given to you by your health   care provider. Make sure you discuss any  questions you have with your health care provider. Document Released: 03/09/2004 Document Revised: 02/04/2016 Document Reviewed: 02/04/2016 Elsevier Interactive Patient Education  2017 [employer] Calorie Counting for [employer] Calories are units of energy. Your body needs a certain amount of calories from food to keep you going throughout the day. When you eat more calories than your body needs, your body stores the extra calories as fat. When you eat fewer calories than your body needs, your body burns fat to get the energy it needs. Calorie counting means keeping track of how many calories you eat and drink each day. Calorie counting can be helpful if you need to lose weight. If you make sure to eat fewer calories than your body needs, you should lose weight. Ask your health care provider what a healthy weight is for you. For calorie counting to work, you will need to eat the right number of calories in a day in order to lose a healthy amount of weight per week. A dietitian can help you determine how many calories you need in a day and will give you suggestions on how to reach your calorie goal.  A healthy amount of weight to lose per week is usually 1-2 lb (0.5-0.9 kg). This usually means that your daily calorie intake should be reduced by 500-750 calories.  Eating 1,200 - 1,500 calories per day can help most women lose weight.  Eating 1,500 - 1,800 calories per day can help most men lose weight.  What is my plan? My goal is to have __________ calories per day. If I have this many calories per day, I should lose around __________ pounds per week. What do I need to know about calorie counting? In order to meet your daily calorie goal, you will need to:  Find out how many calories are in each food you would like to eat. Try to do this before you eat.  Decide how much of the food you plan to eat.  Write down what you ate and how many calories it had. Doing this is called keeping a food  log.  To successfully lose weight, it is important to balance calorie counting with a healthy lifestyle that includes regular activity. Aim for 150 minutes of moderate exercise (such as walking) or 75 minutes of vigorous exercise (such as running) each week. Where do I find calorie information?  The number of calories in a food can be found on a Nutrition Facts label. If a food does not have a Nutrition Facts label, try to look up the calories online or ask your dietitian for help. Remember that calories are listed per serving. If you choose to have more than one serving of a food, you will have to multiply the calories per serving by the amount of servings you plan to eat. For example, the label on a package of bread might say that a serving size is 1 slice and that there are 90 calories in a serving. If you eat 1 slice, you will have eaten 90 calories. If you eat 2 slices, you will have eaten 180 calories. How do I keep a food log? Immediately after each meal, record the following information in your food log:  What you ate. Don't forget to include toppings, sauces, and other extras on the food.  How much you ate. This can be measured in cups, ounces, or number of items.  How many calories each food and drink had.  The total number of calories in the meal.  Keep your food log near you, such as in a small notebook in your pocket, or use a mobile app or website. Some programs will calculate calories for you and show you how many calories you have left for the day to meet your goal. What are some calorie counting tips?  Use your calories on foods and drinks that will fill you up and not leave you hungry: ? Some examples of foods that fill you up are nuts and nut butters, vegetables, lean proteins, and high-fiber foods like whole grains. High-fiber foods are foods with more than 5 g fiber per serving. ? Drinks such as sodas, specialty coffee drinks, alcohol, and juices have a lot of calories,  yet do not fill you up.  Eat nutritious foods and avoid empty calories. Empty calories are calories you get from foods or beverages that do not have many vitamins or protein, such as candy, sweets, and soda. It is better to have a nutritious high-calorie food (such as an avocado) than a food with few nutrients (such as a bag of chips).  Know how many calories are in the foods you eat most often. This will help you calculate calorie counts faster.  Pay attention to calories in drinks. Low-calorie drinks include water and unsweetened drinks.  Pay attention to nutrition labels for "low fat" or "fat free" foods. These foods sometimes have the same amount of calories or more calories than the full fat versions. They also often have added sugar, starch, or salt, to make up for flavor that was removed with the fat.  Find a way of tracking calories that works for you. Get creative. Try different apps or programs if writing down calories does not work for you. What are some portion control tips?  Know how many calories are in a serving. This will help you know how many servings of a certain food you can have.  Use a measuring cup to measure serving sizes. You could also try weighing out portions on a kitchen scale. With time, you will be able to estimate serving sizes for some foods.  Take some time to put servings of different foods on your favorite plates, bowls, and cups so you know what a serving looks like.  Try not to eat straight from a bag or box. Doing this can lead to overeating. Put the amount you would like to eat in a cup or on a plate to make sure you are eating the right portion.  Use smaller plates, glasses, and bowls to prevent overeating.  Try not to multitask (for example, watch TV or use your computer) while eating. If it is time to eat, sit down at a table and enjoy your food. This will help you to know when you are full. It will also help you to be aware of what you are eating and  how much you are eating. What are tips for following this plan? Reading food labels  Check the calorie count compared to the serving size. The serving size may be smaller than what you are used to eating.  Check the source of the calories. Make sure the food you are eating is high in vitamins and protein and low in saturated and trans fats. Shopping  Read nutrition labels while you shop. This will help you make healthy decisions before you decide to purchase your food.  Make a grocery list and stick to it. Cooking  Try to [employer]  your favorite foods in a healthier way. For example, try baking instead of frying.  Use low-fat dairy products. Meal planning  Use more fruits and vegetables. Half of your plate should be fruits and vegetables.  Include lean proteins like poultry and fish. How do I count calories when eating out?  Ask for smaller portion sizes.  Consider sharing an entree and sides instead of getting your own entree.  If you get your own entree, eat only half. Ask for a box at the beginning of your meal and put the rest of your entree in it so you are not tempted to eat it.  If calories are listed on the menu, choose the lower calorie options.  Choose dishes that include vegetables, fruits, whole grains, low-fat dairy products, and lean protein.  Choose items that are boiled, broiled, grilled, or steamed. Stay away from items that are buttered, battered, fried, or served with cream sauce. Items labeled "crispy" are usually fried, unless stated otherwise.  Choose water, low-fat milk, unsweetened iced tea, or other drinks without added sugar. If you want an alcoholic beverage, choose a lower calorie option such as a glass of wine or light beer.  Ask for dressings, sauces, and syrups on the side. These are usually high in calories, so you should limit the amount you eat.  If you want a salad, choose a garden salad and ask for grilled meats. Avoid extra toppings like bacon,  cheese, or fried items. Ask for the dressing on the side, or ask for olive oil and vinegar or lemon to use as dressing.  Estimate how many servings of a food you are given. For example, a serving of cooked rice is  cup or about the size of half a baseball. Knowing serving sizes will help you be aware of how much food you are eating at restaurants. The list below tells you how big or small some common portion sizes are based on everyday objects: ? 1 oz-4 stacked dice. ? 3 oz-1 deck of cards. ? 1 tsp-1 die. ? 1 Tbsp- a ping-pong ball. ? 2 Tbsp-1 ping-pong ball. ?  cup- baseball. ? 1 cup-1 baseball. Summary  Calorie counting means keeping track of how many calories you eat and drink each day. If you eat fewer calories than your body needs, you should lose weight.  A healthy amount of weight to lose per week is usually 1-2 lb (0.5-0.9 kg). This usually means reducing your daily calorie intake by 500-750 calories.  The number of calories in a food can be found on a Nutrition Facts label. If a food does not have a Nutrition Facts label, try to look up the calories online or ask your dietitian for help.  Use your calories on foods and drinks that will fill you up, and not on foods and drinks that will leave you hungry.  Use smaller plates, glasses, and bowls to prevent overeating. This information is not intended to replace advice given to you by your health care provider. Make sure you discuss any questions you have with your health care provider. Document Released: 01/30/2005 Document Revised: 12/31/2015 Document Reviewed: 12/31/2015 Elsevier Interactive Patient Education  2017 [employer].

## 2016-10-19 LAB — LIPID PANEL
CHOL/HDL RATIO: 4.1 ratio (ref 0.0–5.0)
Cholesterol, Total: 183 mg/dL (ref 100–199)
HDL: 45 mg/dL (ref >39)
LDL Calculated: 123 mg/dL — ABNORMAL HIGH (ref 0–99)
Triglycerides: 75 mg/dL (ref 0–149)
VLDL Cholesterol Cal: 15 mg/dL (ref 5–40)

## 2016-10-19 LAB — RENAL FUNCTION PANEL
Albumin: 4.6 g/dL (ref 3.5–5.5)
BUN/Creatinine Ratio: 10 (ref 9–20)
BUN: 9 mg/dL (ref 6–24)
CHLORIDE: 99 mmol/L (ref 96–106)
CO2: 27 mmol/L (ref 20–29)
Calcium: 9.3 mg/dL (ref 8.7–10.2)
Creatinine, Ser: 0.92 mg/dL (ref 0.76–1.27)
GFR, EST AFRICAN AMERICAN: 113 mL/min/{1.73_m2} (ref >59)
GFR, EST NON AFRICAN AMERICAN: 97 mL/min/{1.73_m2} (ref >59)
GLUCOSE: 100 mg/dL — AB (ref 65–99)
PHOSPHORUS: 3.5 mg/dL (ref 2.5–4.5)
POTASSIUM: 4.6 mmol/L (ref 3.5–5.2)
SODIUM: 140 mmol/L (ref 134–144)

## 2016-10-19 NOTE — Telephone Encounter (Signed)
Called [facility]- was addressed yesterday with pt

## 2016-11-01 DIAGNOSIS — F4312 Post-traumatic stress disorder, chronic: Secondary | ICD-10-CM | POA: Diagnosis not present

## 2016-11-15 DIAGNOSIS — F4312 Post-traumatic stress disorder, chronic: Secondary | ICD-10-CM | POA: Diagnosis not present

## 2016-11-16 DIAGNOSIS — M150 Primary generalized (osteo)arthritis: Secondary | ICD-10-CM | POA: Diagnosis not present

## 2016-11-16 DIAGNOSIS — M0579 Rheumatoid arthritis with rheumatoid factor of multiple sites without organ or systems involvement: Secondary | ICD-10-CM | POA: Diagnosis not present

## 2016-11-29 DIAGNOSIS — F4312 Post-traumatic stress disorder, chronic: Secondary | ICD-10-CM | POA: Diagnosis not present

## 2016-12-20 DIAGNOSIS — F4312 Post-traumatic stress disorder, chronic: Secondary | ICD-10-CM | POA: Diagnosis not present

## 2016-12-29 ENCOUNTER — Other Ambulatory Visit: Payer: Self-pay | Admitting: Family Medicine

## 2017-01-10 DIAGNOSIS — F4312 Post-traumatic stress disorder, chronic: Secondary | ICD-10-CM | POA: Diagnosis not present

## 2017-01-24 DIAGNOSIS — F4312 Post-traumatic stress disorder, chronic: Secondary | ICD-10-CM | POA: Diagnosis not present

## 2017-02-21 DIAGNOSIS — F4312 Post-traumatic stress disorder, chronic: Secondary | ICD-10-CM | POA: Diagnosis not present

## 2017-03-07 DIAGNOSIS — F4312 Post-traumatic stress disorder, chronic: Secondary | ICD-10-CM | POA: Diagnosis not present

## 2017-03-08 DIAGNOSIS — M0579 Rheumatoid arthritis with rheumatoid factor of multiple sites without organ or systems involvement: Secondary | ICD-10-CM | POA: Diagnosis not present

## 2017-03-08 DIAGNOSIS — M069 Rheumatoid arthritis, unspecified: Secondary | ICD-10-CM | POA: Diagnosis not present

## 2017-03-21 DIAGNOSIS — F4312 Post-traumatic stress disorder, chronic: Secondary | ICD-10-CM | POA: Diagnosis not present

## 2017-03-28 ENCOUNTER — Ambulatory Visit (INDEPENDENT_AMBULATORY_CARE_PROVIDER_SITE_OTHER): Payer: Medicare Other | Admitting: Family Medicine

## 2017-03-28 ENCOUNTER — Encounter: Payer: Self-pay | Admitting: Family Medicine

## 2017-03-28 VITALS — BP 120/78 | HR 88 | Ht 67.0 in | Wt 195.0 lb

## 2017-03-28 DIAGNOSIS — L509 Urticaria, unspecified: Secondary | ICD-10-CM

## 2017-03-28 NOTE — Patient Instructions (Signed)
Hives Hives (urticaria) are itchy, red, swollen areas on your skin. Hives can appear on any part of your body and can vary in size. They can be as small as the tip of a pen or much larger. Hives often fade within 24 hours (acute hives). In other cases, new hives appear after old ones fade. This cycle can continue for several days or weeks (chronic hives). Hives result from your body's reaction to an irritant or to something that you are allergic to (trigger). When you are exposed to a trigger, your body releases a chemical (histamine) that causes redness, itching, and swelling. You can get hives immediately after being exposed to a trigger or hours later. Hives do not spread from person to person (are not contagious). Your hives may get worse with scratching, exercise, and emotional stress. What are the causes? Causes of this condition include:  Allergies to certain foods or ingredients.  Insect bites or stings.  Exposure to pollen or pet dander.  Contact with latex or chemicals.  Spending time in sunlight, heat, or cold (exposure).  Exercise.  Stress.  You can also get hives from some medical conditions and treatments. These include:  Viruses, including the common cold.  Bacterial infections, such as urinary tract infections and strep throat.  Disorders such as vasculitis, lupus, or thyroid disease.  Certain medications.  Allergy shots.  Blood transfusions.  Sometimes, the cause of hives is not known (idiopathic hives). What increases the risk? This condition is more likely to develop in:  Women.  People who have food allergies, especially to citrus fruits, milk, eggs, peanuts, tree nuts, or shellfish.  People who are allergic to: ? Medicines. ? Latex. ? Insects. ? Animals. ? Pollen.  People who have certain medical conditions, includinglupus or thyroid disease.  What are the signs or symptoms? The main symptom of this condition is raised, itchyred or white  bumps or patches on your skin. These areas may:  Become large and swollen (welts).  Change in shape and location, quickly and repeatedly.  Be separate hives or connect over a large area of skin.  Sting or become painful.  Turn white when pressed in the center (blanch).  In severe cases, yourhands, feet, and face may also become swollen. This may occur if hives develop deeper in your skin. How is this diagnosed? This condition is diagnosed based on your symptoms, medical history, and physical exam. Your skin, urine, or blood may be tested to find out what is causing your hives and to rule out other health issues. Your health care provider may also remove a small sample of skin from the affected area and examine it under a microscope (biopsy). How is this treated? Treatment depends on the severity of your condition. Your health care provider may recommend using cool, wet cloths (cool compresses) or taking cool showers to relieve itching. Hives are sometimes treated with medicines, including:  Antihistamines.  Corticosteroids.  Antibiotics.  An injectable medicine (omalizumab). Your health care provider may prescribe this if you have chronic idiopathic hives and you continue to have symptoms even after treatment with antihistamines.  Severe cases may require an emergency injection of adrenaline (epinephrine) to prevent a life-threatening allergic reaction (anaphylaxis). Follow these instructions at home: Medicines  Take or apply over-the-counter and prescription medicines only as told by your health care provider.  If you were prescribed an antibiotic medicine, use it as told by your health care provider. Do not stop taking the antibiotic even if you start  to feel better. Skin Care  Apply cool compresses to the affected areas.  Do not scratch or rub your skin. General instructions  Do not take hot showers or baths. This can make itching worse.  Do not wear tight-fitting  clothing.  Use sunscreen and wear protective clothing when you are outside.  Avoid any substances that cause your hives. Keep a journal to help you track what causes your hives. Write down: ? What medicines you take. ? What you eat and drink. ? What products you use on your skin.  Keep all follow-up visits as told by your health care provider. This is important. Contact a health care provider if:  Your symptoms are not controlled with medicine.  Your joints are painful or swollen. Get help right away if:  You have a fever.  You have pain in your abdomen.  Your tongue or lips are swollen.  Your eyelids are swollen.  Your chest or throat feels tight.  You have trouble breathing or swallowing. These symptoms may represent a serious problem that is an emergency. Do not wait to see if the symptoms will go away. Get medical help right away. Call your local emergency services (911 in the [place] Do not drive yourself to the hospital. This information is not intended to replace advice given to you by your health care provider. Make sure you discuss any questions you have with your health care provider. Document Released: 01/30/2005 Document Revised: 06/30/2015 Document Reviewed: 11/18/2014 Elsevier Interactive Patient Education  2018 [employer]. Erythema Multiforme Erythema multiforme is a rash that usually occurs on the skin, but can also occur on the lips and on the inside of the mouth. It is usually a mild condition that goes away on its own. It most often affects young adults and children. The rash shows up suddenly and often lasts 1-4 weeks. In some cases, the rash may come back again after clearing up. What are the causes? The cause of erythema multiforme may be an overreaction by the body's immune system to a trigger. Common triggers include:  Infection, most commonly by the cold sore virus (human herpes virus, HSV), bacteria, or fungus.  Less common triggers  include:  Medicines.  Other illnesses.  In some cases, the cause may not be known. What are the signs or symptoms? The rash from erythema multiforme shows up suddenly. It may appear days after exposure to the trigger. It may start as small, red, round or oval marks that become bumps or raised welts over 24-48 hours. These bumps may resemble a target or a "bull's eye." These can spread and be quite large (about 1 inch [2.5 cm]). There may be mild itching or burning of the skin at first. These skin changes usually appear first on the backs of the hands. They may then spread to the tops of the feet, the arms, the elbows, the knees, the palms, and the soles of the feet. There may be a mild rash on the lips and lining of the mouth. The skin rash may show up in waves over a few days. It may take 2-4 weeks for the rash to go away. The rash may return at a later time. How is this diagnosed? Diagnosis of erythema multiforme is usually made based on a physical exam and medical history. To help confirm the diagnosis, a small piece of skin tissue is sometimes removed (skin biopsy) so it can be examined under a microscope by a specialist (pathologist). How is this treated?  Most episodes of erythema multiforme heal on their own. Treatment may not be needed. Your health care provider will recommend removing or avoiding the trigger if possible. If the trigger is an infection or other illness, you may receive treatment for that infection or illness. You may also be given medicine for itching. Other medicines may be used for severe cases or to help prevent repeat bouts of erythema multiforme. Follow these instructions at home:  Take medicines only as directed by your health care provider.  If possible, avoid known triggers.  If a medicine was your trigger, be sure to notify all of your health care providers. You should avoid this medicine or any like it in the future.  If your trigger was a herpes virus  infection, use sunscreen lotion and sunscreen-containing lip balm to prevent sunlight triggered outbreaks of herpes virus.  Apply moist compresses as needed to help control itching. Cool or warm baths may also help. Avoid hot baths or showers.  Eat soft foods if you have mouth sores.  Keep all follow-up visits as directed by your health care provider. This is important. Contact a health care provider if:  Your rash shows up again in the future.  You have a fever. Get help right away if:  You develop redness and swelling on your lips or in your mouth.  You have a burning feeling on your lips or in your mouth.  You develop blisters or open sores on your mouth, lips, vagina, penis, or anus.  You have eye pain, or you have redness or drainage in your eye.  You develop blisters on your skin.  You have difficulty breathing.  You have difficulty swallowing, or you start drooling.  You have blood in your urine.  You have pain with urination. This information is not intended to replace advice given to you by your health care provider. Make sure you discuss any questions you have with your health care provider. Document Released: 01/30/2005 Document Revised: 07/08/2015 Document Reviewed: 09/23/2013 Elsevier Interactive Patient Education  [employer].

## 2017-03-28 NOTE — Progress Notes (Signed)
Name: Patrick Perez   MRN: [id number]    DOB: 05/03/1965   Date:03/28/2017       Progress Note  Subjective  Chief Complaint  Chief Complaint  Patient presents with  . Rash    itchy and red, splotchy- on entire body- ate pistachios and a chimmichonga    Rash  This is a new problem. The current episode started in the past 7 days (2 days ago). The problem has been gradually worsening since onset. The rash is diffuse. The rash is characterized by itchiness and redness. Associated with: pitaschios/chimiconga/ mushrooms. Pertinent negatives include no congestion, cough, diarrhea, fever, joint pain, shortness of breath, sore throat or vomiting. Past treatments include antihistamine (zyrtec). The treatment provided mild relief.    No problem-specific Assessment & Plan notes found for this encounter.   Past Medical History:  Diagnosis Date  . Arthritis    rheumatoid    Past Surgical History:  Procedure Laterality Date  . TIBIA FRACTURE SURGERY Left   . TONSILLECTOMY      Family History  Problem Relation Age of Onset  . Diabetes Mother   . Lung cancer Maternal Grandfather     Social History   Socioeconomic History  . Marital status: Married    Spouse name: Not on file  . Number of children: Not on file  . Years of education: Not on file  . Highest education level: Not on file  Social Needs  . Financial resource strain: Not on file  . Food insecurity - worry: Not on file  . Food insecurity - inability: Not on file  . Transportation needs - medical: Not on file  . Transportation needs - non-medical: Not on file  Occupational History  . Not on file  Tobacco Use  . Smoking status: Current Every Day Smoker    Packs/day: 0.50    Types: Cigarettes  . Smokeless tobacco: Never Used  Substance and Sexual Activity  . Alcohol use: Yes    Alcohol/week: 2.4 oz    Types: 4 Cans of beer per week  . Drug use: Yes    Frequency: 7.0 times per week    Types: Marijuana    Comment:  smokes once a day   . Sexual activity: Yes  Other Topics Concern  . Not on file  Social History Narrative  . Not on file    No Known Allergies  Outpatient Medications Prior to Visit  Medication Sig Dispense Refill  . Cyanocobalamin (B-12) 100 MCG TABS B12    . cyclobenzaprine (FLEXERIL) 10 MG tablet Take 1 tablet by mouth 3 (three) times daily. Dr Alfredo Batty  5  . folic acid (FOLVITE) 1 MG tablet Take 1 mg by mouth.    . Golimumab 50 MG/0.5ML SOAJ Inject 50 mg into the skin.    . methotrexate 50 MG/2ML injection Inject 1 mL into the muscle once a week. Dr Alfredo Batty  0  . mupirocin ointment (BACTROBAN) 2 % Apply 1 application topically 2 (two) times daily. 22 g 0  . Naproxen Sodium (ALEVE) 220 MG CAPS Aleve 220 mg capsule  prn    . Omega-3 Fatty Acids (FISH OIL) 1000 MG CAPS Fish Oil    . Vitamin D, Cholecalciferol, 400 units TABS Take 1 tablet by mouth 1 day or 1 dose.    Marland Kitchen amoxicillin-clavulanate (AUGMENTIN) 875-125 MG tablet Take 1 tablet by mouth 2 (two) times daily. 20 tablet 0   No facility-administered medications prior to visit.     Review of  Systems  Constitutional: Negative for chills, fever, malaise/fatigue and weight loss.  HENT: Negative for congestion, ear discharge, ear pain and sore throat.   Eyes: Negative for blurred vision.  Respiratory: Negative for cough, sputum production, shortness of breath and wheezing.   Cardiovascular: Negative for chest pain, palpitations and leg swelling.  Gastrointestinal: Negative for abdominal pain, blood in stool, constipation, diarrhea, heartburn, melena, nausea and vomiting.  Genitourinary: Negative for dysuria, frequency, hematuria and urgency.  Musculoskeletal: Negative for back pain, joint pain, myalgias and neck pain.  Skin: Positive for rash.  Neurological: Negative for dizziness, tingling, sensory change, focal weakness and headaches.  Endo/Heme/Allergies: Negative for environmental allergies and polydipsia. Does not  bruise/bleed easily.  Psychiatric/Behavioral: Negative for depression and suicidal ideas. The patient is not nervous/anxious and does not have insomnia.      Objective  Vitals:   03/28/17 1042  BP: 120/78  Pulse: 88  Weight: 195 lb (88.5 kg)  Height: 5\' 7"  (1.702 m)    Physical Exam  Constitutional: He is oriented to person, place, and time and well-developed, well-nourished, and in no distress.  HENT:  Head: Normocephalic.  Right Ear: External ear normal.  Left Ear: External ear normal.  Nose: Nose normal.  Mouth/Throat: Oropharynx is clear and moist.  Eyes: Conjunctivae and EOM are normal. Pupils are equal, round, and reactive to light. Right eye exhibits no discharge. Left eye exhibits no discharge. No scleral icterus.  Neck: Normal range of motion. Neck supple. No JVD present. No tracheal deviation present. No thyromegaly present.  Cardiovascular: Normal rate, regular rhythm, normal heart sounds and intact distal pulses. Exam reveals no gallop and no friction rub.  No murmur heard. Pulmonary/Chest: Breath sounds normal. No respiratory distress. He has no wheezes. He has no rales.  Abdominal: Soft. Bowel sounds are normal. He exhibits no mass. There is no hepatosplenomegaly. There is no tenderness. There is no rebound, no guarding and no CVA tenderness.  Musculoskeletal: Normal range of motion. He exhibits no edema or tenderness.  Lymphadenopathy:    He has no cervical adenopathy.  Neurological: He is alert and oriented to person, place, and time. He has normal sensation, normal strength, normal reflexes and intact cranial nerves. No cranial nerve deficit.  Skin: Skin is warm. Rash noted. There is erythema.  Psychiatric: Mood and affect normal.  Nursing note and vitals reviewed.     Assessment & Plan  Problem List Items Addressed This Visit    None    Visit Diagnoses    Urticaria    -  Primary      No orders of the defined types were placed in this  encounter.  Zyrtec/ benedryl PM   Dr. [facility]  03/28/17

## 2017-04-04 DIAGNOSIS — F4312 Post-traumatic stress disorder, chronic: Secondary | ICD-10-CM | POA: Diagnosis not present

## 2017-04-18 DIAGNOSIS — F4312 Post-traumatic stress disorder, chronic: Secondary | ICD-10-CM | POA: Diagnosis not present

## 2017-05-02 DIAGNOSIS — F4312 Post-traumatic stress disorder, chronic: Secondary | ICD-10-CM | POA: Diagnosis not present

## 2017-05-16 DIAGNOSIS — F4312 Post-traumatic stress disorder, chronic: Secondary | ICD-10-CM | POA: Diagnosis not present

## 2017-05-30 DIAGNOSIS — F4312 Post-traumatic stress disorder, chronic: Secondary | ICD-10-CM | POA: Diagnosis not present

## 2017-06-12 DIAGNOSIS — M0579 Rheumatoid arthritis with rheumatoid factor of multiple sites without organ or systems involvement: Secondary | ICD-10-CM | POA: Diagnosis not present

## 2017-06-13 DIAGNOSIS — F4312 Post-traumatic stress disorder, chronic: Secondary | ICD-10-CM | POA: Diagnosis not present

## 2017-06-27 DIAGNOSIS — F4312 Post-traumatic stress disorder, chronic: Secondary | ICD-10-CM | POA: Diagnosis not present

## 2017-07-11 DIAGNOSIS — F4312 Post-traumatic stress disorder, chronic: Secondary | ICD-10-CM | POA: Diagnosis not present

## 2017-07-25 DIAGNOSIS — F4312 Post-traumatic stress disorder, chronic: Secondary | ICD-10-CM | POA: Diagnosis not present

## 2017-07-26 ENCOUNTER — Other Ambulatory Visit: Payer: Self-pay

## 2017-07-26 ENCOUNTER — Telehealth: Payer: Self-pay | Admitting: Family Medicine

## 2017-07-26 MED ORDER — MUPIROCIN 2 % EX OINT: 1.0000 "application " | TOPICAL_OINTMENT | Freq: Two times a day (BID) | CUTANEOUS | 0 refills | Status: DC

## 2017-07-26 NOTE — Telephone Encounter (Signed)
Pt needs refill sent to [facility] in [place]  mupirocin ointment (BACTROBAN) 2 % [[id number]]

## 2017-07-26 NOTE — Telephone Encounter (Signed)
done

## 2017-08-08 DIAGNOSIS — F4312 Post-traumatic stress disorder, chronic: Secondary | ICD-10-CM | POA: Diagnosis not present

## 2017-08-29 ENCOUNTER — Ambulatory Visit (INDEPENDENT_AMBULATORY_CARE_PROVIDER_SITE_OTHER): Payer: Medicare Other

## 2017-08-29 VITALS — BP 110/70 | HR 100 | Temp 98.3°F | Resp 12 | Ht 67.0 in | Wt 196.6 lb

## 2017-08-29 DIAGNOSIS — Z0000 Encounter for general adult medical examination without abnormal findings: Secondary | ICD-10-CM | POA: Diagnosis not present

## 2017-08-29 DIAGNOSIS — F4312 Post-traumatic stress disorder, chronic: Secondary | ICD-10-CM | POA: Diagnosis not present

## 2017-08-29 DIAGNOSIS — Z1211 Encounter for screening for malignant neoplasm of colon: Secondary | ICD-10-CM | POA: Diagnosis not present

## 2017-08-29 NOTE — Progress Notes (Addendum)
Subjective:   Demetrious Skenandore is a 52 y.o. male who presents for Medicare Annual/Subsequent preventive examination.  Review of Systems:  N/A Cardiac Risk Factors include: advanced age (>71men, >39 women);male gender;obesity (BMI >30kg/m2);sedentary lifestyle;smoking/ tobacco exposure     Objective:    Vitals: BP 110/70 (BP Location: Right Arm, Patient Position: Sitting, Cuff Size: Normal)   Pulse 100   Temp 98.3 F (36.8 C) (Oral)   Resp 12   Ht 5\' 7"  (1.702 m)   Wt 196 lb 9.6 oz (89.2 kg)   SpO2 95%   BMI 30.79 kg/m   Body mass index is 30.79 kg/m.  Advanced Directives 08/29/2017 08/29/2017 08/28/2016 08/10/2014  Does Patient Have a Medical Advance Directive? No No No No  Would patient like information on creating a medical advance directive? Yes (MAU/Ambulatory/Procedural Areas - Information given) Yes (MAU/Ambulatory/Procedural Areas - Information given) Yes (MAU/Ambulatory/Procedural Areas - Information given) No - patient declined information    Tobacco Social History   Tobacco Use  Smoking Status Current Every Day Smoker  . Packs/day: 0.50  . Years: 27.00  . Pack years: 13.50  . Types: Cigarettes  Smokeless Tobacco Never Used     Ready to quit: No Counseling given: Yes  Clinical Intake:  Pre-visit preparation completed: Yes  Pain : No/denies pain   BMI - recorded: 30.79 Nutritional Status: BMI > 30  Obese Nutritional Risks: None Diabetes: No  How often do you need to have someone help you when you read instructions, pamphlets, or other written materials from your doctor or pharmacy?: 1 - Never  Interpreter Needed?: No  Information entered by :: AEversole, LPN  Past Medical History:  Diagnosis Date  . Arthritis    rheumatoid   Past Surgical History:  Procedure Laterality Date  . TIBIA FRACTURE SURGERY Left   . TONSILLECTOMY     Family History  Problem Relation Age of Onset  . Diabetes Mother   . Cancer Father        prostate  . Cancer  Paternal Grandfather        colon   Social History   Socioeconomic History  . Marital status: Divorced    Spouse name: Not on file  . Number of children: 3  . Years of education: Not on file  . Highest education level: GED or equivalent  Occupational History  . Occupation: Disabled  Social Needs  . Financial resource strain: Not hard at all  . Food insecurity:    Worry: Never true    Inability: Never true  . Transportation needs:    Medical: No    Non-medical: No  Tobacco Use  . Smoking status: Current Every Day Smoker    Packs/day: 0.50    Years: 27.00    Pack years: 13.50    Types: Cigarettes  . Smokeless tobacco: Never Used  Substance and Sexual Activity  . Alcohol use: Yes    Alcohol/week: 1.2 oz    Types: 2 Cans of beer per week  . Drug use: Yes    Frequency: 7.0 times per week    Types: Marijuana    Comment: smokes once a day   . Sexual activity: Yes  Lifestyle  . Physical activity:    Days per week: 0 days    Minutes per session: 0 min  . Stress: Only a little  Relationships  . Social connections:    Talks on phone: Patient refused    Gets together: Patient refused    Attends religious  service: Patient refused    Active member of club or organization: Patient refused    Attends meetings of clubs or organizations: Patient refused    Relationship status: Divorced  Other Topics Concern  . Not on file  Social History Narrative  . Not on file    Outpatient Encounter Medications as of 08/29/2017  Medication Sig  . Cyanocobalamin (B-12) 100 MCG TABS take 1 tablet daily  . cyclobenzaprine (FLEXERIL) 10 MG tablet Take 1 tablet by mouth 3 (three) times daily. Dr Alfredo Batty  . Flaxseed, Linseed, (FLAX SEED OIL PO) Take 1 capsule by mouth daily.  . folic acid (FOLVITE) 1 MG tablet Take 1 mg by mouth.  . Golimumab 50 MG/0.5ML SOAJ Inject 50 mg into the skin.  . methotrexate 50 MG/2ML injection Inject 1 mL into the muscle once a week. Dr Alfredo Batty  . mupirocin  ointment (BACTROBAN) 2 % Apply 1 application topically 2 (two) times daily.  . Naproxen Sodium (ALEVE) 220 MG CAPS Aleve 220 mg capsule  prn  . Omega-3 Fatty Acids (FISH OIL) 1000 MG CAPS Take 1 capsule daily  . Vitamin D, Cholecalciferol, 400 units TABS Take 1 tablet by mouth 1 day or 1 dose.   No facility-administered encounter medications on file as of 08/29/2017.     Activities of Daily Living In your present state of health, do you have any difficulty performing the following activities: 08/29/2017  Hearing? N  Comment denies hearing aids  Vision? N  Comment wears eyeglasses  Difficulty concentrating or making decisions? N  Walking or climbing stairs? Y  Comment joint pain  Dressing or bathing? N  Doing errands, shopping? N  Preparing Food and eating ? N  Comment denies dentures  Using the Toilet? N  In the past six months, have you accidently leaked urine? Y  Comment occassional dribbling  Do you have problems with loss of bowel control? N  Managing your Medications? N  Managing your Finances? N  Housekeeping or managing your Housekeeping? N  Some recent data might be hidden    Patient Care Team: Duanne Limerick, MD as PCP - General (Family Medicine) [name], MD as Referring Physician (Specialist) Steele Sizer, MD as Consulting Physician (Psychology)   Assessment:   This is a routine wellness examination for [place].  Exercise Activities and Dietary recommendations Current Exercise Habits: The patient does not participate in regular exercise at present, Exercise limited by: Other - see comments(RA)  Goals    . DIET - INCREASE WATER INTAKE     Recommend to drink at least 6-8 8oz glasses of water per day.       Fall Risk Fall Risk  08/29/2017 08/28/2016  Falls in the past year? - No  Risk for fall due to : Impaired vision;Medication side effect;Other (Comment);Impaired balance/gait -  Risk for fall due to: Comment wears eyeglasses; RA -   FALL RISK  PREVENTION PERTAINING TO HOME: Is your home free of loose throw rugs in walkways, pet beds, electrical cords, etc? Yes Is there adequate lighting in your home to reduce risk of falls?  Yes Are there stairs in or around your home WITH handrails? Yes  ASSISTIVE DEVICES UTILIZED TO PREVENT FALLS: Use of a cane, walker or w/c? Yes, uses cane walker and W/C d/t RA Grab bars in the bathroom? Yes  Shower chair or a place to sit while bathing? No An elevated toilet seat or a handicapped toilet? No  Timed Get Up and Go Performed: Yes.  Pt ambulated 10 feet within 22 sec. Gait slow, steady and without the use of an assistive device. No intervention required at this time. Fall risk prevention has been discussed.  Community Resource Referral:  Pt declined my offer to send [employer] Referral to Care Guide for a shower chair or an elevated toilet seat.  Depression Screen PHQ 2/9 Scores 08/29/2017 08/28/2016  PHQ - 2 Score 4 0  PHQ- 9 Score 10 -    Cognitive Function     6CIT Screen 08/29/2017 08/28/2016  What Year? 0 points 0 points  What month? 0 points 0 points  What time? 0 points 0 points  Count back from 20 0 points 0 points  Months in reverse 0 points 0 points  Repeat phrase 0 points 0 points  Total Score 0 0    Immunization History  Administered Date(s) Administered  . Influenza,inj,Quad PF,6+ Mos 10/18/2016  . Influenza,inj,quad, With Preservative 11/13/2016  . Tdap 08/10/2014    Qualifies for Shingles Vaccine? Yes. Due for Shingrix. Education has been provided regarding the importance of this vaccine. Pt has been advised to call his insurance company to determine his out of pocket expense. Advised he may also receive this vaccine at his local pharmacy or Health Dept. Verbalized acceptance and understanding.  Screening Tests Health Maintenance  Topic Date Due  . COLONOSCOPY  10/25/2016  . INFLUENZA VACCINE  09/13/2017  . TETANUS/TDAP  08/09/2024  . HIV Screening   Completed   Cancer Screenings: Lung: Low Dose CT Chest recommended if Age 82-80 years, 30 pack-year currently smoking OR have quit w/in 15years. Patient does not qualify. Colorectal: Due for screening colonoscopy. Ordered GI referral today. Pt aware that he will receive a call from our office re: his appt.  Additional Screenings: Hepatitis C Screening: Does not qualify     Plan:  I have personally reviewed and addressed the Medicare Annual Wellness questionnaire and have noted the following in the patient's chart:  A. Medical and social history B. Use of alcohol, tobacco or illicit drugs  C. Current medications and supplements D. Functional ability and status E.  Nutritional status F.  Physical activity G. Advance directives H. List of other physicians I.  Hospitalizations, surgeries, and ER visits in previous 12 months J.  Vitals K. Screenings such as hearing and vision if needed, cognitive and depression L. Referrals and appointments  In addition, I have reviewed and discussed with patient certain preventive protocols, quality metrics, and best practice recommendations. A written personalized care plan for preventive services as well as general preventive health recommendations were provided to patient.  Signed,  Deon Pilling, LPN Nurse Health Advisor  MD Recommendations:Due for Shingrix. Education has been provided regarding the importance of this vaccine. Pt has been advised to call his insurance company to determine his out of pocket expense. Advised he may also receive this vaccine at his local pharmacy or Health Dept. Verbalized acceptance and understanding.  Due for screening colonoscopy. Ordered GI referral today. Pt aware that he will receive a call from our office re: his appt.  PHQ9 score = 10

## 2017-08-29 NOTE — Patient Instructions (Addendum)
Patrick Perez , Thank you for taking time to come for your Medicare Wellness Visit. I appreciate your ongoing commitment to your health goals. Please review the following plan we discussed and let me know if I can assist you in the future.   Screening recommendations/referrals: Colorectal Screening: You will receive a call from our office regarding your apptointment  Vision and Dental Exams: Recommended annual ophthalmology exams for early detection of glaucoma and other disorders of the eye Recommended annual dental exams for proper oral hygiene  Vaccinations: Influenza vaccine: Up to date Pneumococcal vaccine: Not yet required Tdap vaccine: Up to date Shingles vaccine: Please call your insurance company to determine your out of pocket expense for the Shingrix vaccine. You may also receive this vaccine at your local pharmacy or Health Dept.    Advanced directives: Advance directive discussed with you today. I have provided a copy for you to complete at home and have notarized. Once this is complete please bring a copy in to our office so we can scan it into your chart.  Goals: Recommend to drink at least 6-8 8oz glasses of water per day.  Next appointment: Please schedule your Annual Wellness Visit with your Nurse Health Advisor in one year.  Please schedule an appointment with Dr. Yetta Barre within the next 30 days for follow up after today's Annual Wellness Visit.  Preventive Care 40-64 Years, Male Preventive care refers to lifestyle choices and visits with your health care provider that can promote health and wellness. What does preventive care include?  A yearly physical exam. This is also called an annual well check.  Dental exams once or twice a year.  Routine eye exams. Ask your health care provider how often you should have your eyes checked.  Personal lifestyle choices, including:  Daily care of your teeth and gums.  Regular physical activity.  Eating a healthy  diet.  Avoiding tobacco and drug use.  Limiting alcohol use.  Practicing safe sex.  Taking low-dose aspirin every day starting at age 49. What happens during an annual well check? The services and screenings done by your health care provider during your annual well check will depend on your age, overall health, lifestyle risk factors, and family history of disease. Counseling  Your health care provider may ask you questions about your:  Alcohol use.  Tobacco use.  Drug use.  Emotional well-being.  Home and relationship well-being.  Sexual activity.  Eating habits.  Work and work Astronomer. Screening  You may have the following tests or measurements:  Height, weight, and BMI.  Blood pressure.  Lipid and cholesterol levels. These may be checked every 5 years, or more frequently if you are over 90 years old.  Skin check.  Lung cancer screening. You may have this screening every year starting at age 22 if you have a 30-pack-year history of smoking and currently smoke or have quit within the past 15 years.  Fecal occult blood test (FOBT) of the stool. You may have this test every year starting at age 21.  Flexible sigmoidoscopy or colonoscopy. You may have a sigmoidoscopy every 5 years or a colonoscopy every 10 years starting at age 60.  Prostate cancer screening. Recommendations will vary depending on your family history and other risks.  Hepatitis C blood test.  Hepatitis B blood test.  Sexually transmitted disease (STD) testing.  Diabetes screening. This is done by checking your blood sugar (glucose) after you have not eaten for a while (fasting). You may have  this done every 1-3 years. Discuss your test results, treatment options, and if necessary, the need for more tests with your health care provider. Vaccines  Your health care provider may recommend certain vaccines, such as:  Influenza vaccine. This is recommended every year.  Tetanus, diphtheria, and  acellular pertussis (Tdap, Td) vaccine. You may need a Td booster every 10 years.  Zoster vaccine. You may need this after age 73.  Pneumococcal 13-valent conjugate (PCV13) vaccine. You may need this if you have certain conditions and have not been vaccinated.  Pneumococcal polysaccharide (PPSV23) vaccine. You may need one or two doses if you smoke cigarettes or if you have certain conditions. Talk to your health care provider about which screenings and vaccines you need and how often you need them. This information is not intended to replace advice given to you by your health care provider. Make sure you discuss any questions you have with your health care provider. Document Released: 02/26/2015 Document Revised: 10/20/2015 Document Reviewed: 12/01/2014 Elsevier Interactive Patient Education  2017 [employer].  Fall Prevention in the Home Falls can cause injuries. They can happen to people of all ages. There are many things you can do to make your home safe and to help prevent falls. What can I do on the outside of my home?  Regularly fix the edges of walkways and driveways and fix any cracks.  Remove anything that might make you trip as you walk through a door, such as a raised step or threshold.  Trim any bushes or trees on the path to your home.  Use bright outdoor lighting.  Clear any walking paths of anything that might make someone trip, such as rocks or tools.  Regularly check to see if handrails are loose or broken. Make sure that both sides of any steps have handrails.  Any raised decks and porches should have guardrails on the edges.  Have any leaves, snow, or ice cleared regularly.  Use sand or salt on walking paths during winter.  Clean up any spills in your garage right away. This includes oil or grease spills. What can I do in the bathroom?  Use night lights.  Install grab bars by the toilet and in the tub and shower. Do not use towel bars as grab bars.  Use  non-skid mats or decals in the tub or shower.  If you need to sit down in the shower, use a plastic, non-slip stool.  Keep the floor dry. Clean up any water that spills on the floor as soon as it happens.  Remove soap buildup in the tub or shower regularly.  Attach bath mats securely with double-sided non-slip rug tape.  Do not have throw rugs and other things on the floor that can make you trip. What can I do in the bedroom?  Use night lights.  Make sure that you have a light by your bed that is easy to reach.  Do not use any sheets or blankets that are too big for your bed. They should not hang down onto the floor.  Have a firm chair that has side arms. You can use this for support while you get dressed.  Do not have throw rugs and other things on the floor that can make you trip. What can I do in the kitchen?  Clean up any spills right away.  Avoid walking on wet floors.  Keep items that you use a lot in easy-to-reach places.  If you need to reach something  above you, use a strong step stool that has a grab bar.  Keep electrical cords out of the way.  Do not use floor polish or wax that makes floors slippery. If you must use wax, use non-skid floor wax.  Do not have throw rugs and other things on the floor that can make you trip. What can I do with my stairs?  Do not leave any items on the stairs.  Make sure that there are handrails on both sides of the stairs and use them. Fix handrails that are broken or loose. Make sure that handrails are as long as the stairways.  Check any carpeting to make sure that it is firmly attached to the stairs. Fix any carpet that is loose or worn.  Avoid having throw rugs at the top or bottom of the stairs. If you do have throw rugs, attach them to the floor with carpet tape.  Make sure that you have a light switch at the top of the stairs and the bottom of the stairs. If you do not have them, ask someone to add them for you. What  else can I do to help prevent falls?  Wear shoes that:  Do not have high heels.  Have rubber bottoms.  Are comfortable and fit you well.  Are closed at the toe. Do not wear sandals.  If you use a stepladder:  Make sure that it is fully opened. Do not climb a closed stepladder.  Make sure that both sides of the stepladder are locked into place.  Ask someone to hold it for you, if possible.  Clearly mark and make sure that you can see:  Any grab bars or handrails.  First and last steps.  Where the edge of each step is.  Use tools that help you move around (mobility aids) if they are needed. These include:  Canes.  Walkers.  Scooters.  Crutches.  Turn on the lights when you go into a dark area. Replace any light bulbs as soon as they burn out.  Set up your furniture so you have a clear path. Avoid moving your furniture around.  If any of your floors are uneven, fix them.  If there are any pets around you, be aware of where they are.  Review your medicines with your doctor. Some medicines can make you feel dizzy. This can increase your chance of falling. Ask your doctor what other things that you can do to help prevent falls. This information is not intended to replace advice given to you by your health care provider. Make sure you discuss any questions you have with your health care provider. Document Released: 11/26/2008 Document Revised: 07/08/2015 Document Reviewed: 03/06/2014 Elsevier Interactive Patient Education  2017 [employer].

## 2017-08-31 ENCOUNTER — Telehealth: Payer: Self-pay | Admitting: Gastroenterology

## 2017-08-31 NOTE — Telephone Encounter (Signed)
Gastroenterology Pre-Procedure Review  Request Date: 09/10/17   [facility] ([place] Requesting Physician: Dr. Servando Snare   PATIENT REVIEW QUESTIONS: The patient responded to the following health history questions as indicated:    1. Are you having any GI issues? no 2. Do you have a personal history of Polyps? no 3. Do you have a family history of Colon Cancer or Polyps? yes (Paternal grandfater CC) 4. Diabetes Mellitus? no 5. Joint replacements in the past 12 months?no 6. Major health problems in the past 3 months?no 7. Any artificial heart valves, MVP, or defibrillator?no    MEDICATIONS & ALLERGIES:    Patient reports the following regarding taking any anticoagulation/antiplatelet therapy:   Plavix, Coumadin, Eliquis, Xarelto, Lovenox, Pradaxa, Brilinta, or Effient? no Aspirin? no  Patient confirms/reports the following medications:  Current Outpatient Medications  Medication Sig Dispense Refill  . Cyanocobalamin (B-12) 100 MCG TABS take 1 tablet daily    . cyclobenzaprine (FLEXERIL) 10 MG tablet Take 1 tablet by mouth 3 (three) times daily. Dr Alfredo Batty  5  . Flaxseed, Linseed, (FLAX SEED OIL PO) Take 1 capsule by mouth daily.    . folic acid (FOLVITE) 1 MG tablet Take 1 mg by mouth.    . Golimumab 50 MG/0.5ML SOAJ Inject 50 mg into the skin.    . methotrexate 50 MG/2ML injection Inject 1 mL into the muscle once a week. Dr Alfredo Batty  0  . mupirocin ointment (BACTROBAN) 2 % Apply 1 application topically 2 (two) times daily. 22 g 0  . Naproxen Sodium (ALEVE) 220 MG CAPS Aleve 220 mg capsule  prn    . Omega-3 Fatty Acids (FISH OIL) 1000 MG CAPS Take 1 capsule daily    . Vitamin D, Cholecalciferol, 400 units TABS Take 1 tablet by mouth 1 day or 1 dose.     No current facility-administered medications for this visit.     Patient confirms/reports the following allergies:  No Known Allergies  No orders of the defined types were placed in this encounter.   AUTHORIZATION INFORMATION Primary  Insurance: 1D#: Group #:  Secondary Insurance: 1D#: Group #:  SCHEDULE INFORMATION: Date: 09/10/17  Servando Snare Time: Location: [facility]

## 2017-09-03 ENCOUNTER — Other Ambulatory Visit: Payer: Self-pay

## 2017-09-03 DIAGNOSIS — Z1211 Encounter for screening for malignant neoplasm of colon: Secondary | ICD-10-CM

## 2017-09-10 ENCOUNTER — Ambulatory Visit: Admit: 2017-09-10 | Payer: Medicare Other | Admitting: Gastroenterology

## 2017-09-10 SURGERY — COLONOSCOPY WITH PROPOFOL
Anesthesia: Choice

## 2017-09-12 DIAGNOSIS — F4312 Post-traumatic stress disorder, chronic: Secondary | ICD-10-CM | POA: Diagnosis not present

## 2017-09-26 DIAGNOSIS — F4312 Post-traumatic stress disorder, chronic: Secondary | ICD-10-CM | POA: Diagnosis not present

## 2017-10-05 DIAGNOSIS — M150 Primary generalized (osteo)arthritis: Secondary | ICD-10-CM | POA: Diagnosis not present

## 2017-10-05 DIAGNOSIS — M0579 Rheumatoid arthritis with rheumatoid factor of multiple sites without organ or systems involvement: Secondary | ICD-10-CM | POA: Diagnosis not present

## 2017-10-10 DIAGNOSIS — F4312 Post-traumatic stress disorder, chronic: Secondary | ICD-10-CM | POA: Diagnosis not present

## 2017-10-24 DIAGNOSIS — F54 Psychological and behavioral factors associated with disorders or diseases classified elsewhere: Secondary | ICD-10-CM | POA: Diagnosis not present

## 2017-10-24 DIAGNOSIS — F4312 Post-traumatic stress disorder, chronic: Secondary | ICD-10-CM | POA: Diagnosis not present

## 2017-10-27 ENCOUNTER — Ambulatory Visit
Admission: EM | Admit: 2017-10-27 | Discharge: 2017-10-27 | Disposition: A | Discharge status: Home / Self Care | Payer: Medicare Other | Attending: Family Medicine | Admitting: Family Medicine

## 2017-10-27 DIAGNOSIS — H9202 Otalgia, left ear: Secondary | ICD-10-CM | POA: Diagnosis not present

## 2017-10-27 MED ORDER — CEFDINIR 300 MG PO CAPS: 300.0000 mg | ORAL_CAPSULE | Freq: Two times a day (BID) | ORAL | 0 refills | Status: DC

## 2017-10-27 MED ORDER — TRAMADOL HCL 50 MG PO TABS: 50.0000 mg | ORAL_TABLET | Freq: Three times a day (TID) | ORAL | 0 refills | Status: DC | PRN

## 2017-10-27 NOTE — ED Provider Notes (Signed)
[facility]  CSN: [id number] Arrival date & time: 10/27/17  1228  History   Chief Complaint Chief Complaint  Patient presents with  . Otalgia   HPI  52 year old male presents with the above complaint.  Patient reports severe left ear pain which started last night.  Patient states that he has pain surrounding his ear anteriorly and extending down the jaw.  Pain is severe.  Interfered with sleep last night.  He has taken Tylenol without improvement.  Exacerbated by touch and pressure.  No relieving factors.  No other complaints or concerns.  PMH, Surgical Hx, Family Hx, Social History reviewed and updated as below.  Past Medical History:  Diagnosis Date  . Arthritis    rheumatoid   Patient Active Problem List   Diagnosis Date Noted  . RA (rheumatoid arthritis) (HCC) 09/13/2015  . Rheumatoid arthritis (HCC) 09/13/2015  . Laceration of right hand with complication 08/10/2014    Past Surgical History:  Procedure Laterality Date  . TIBIA FRACTURE SURGERY Left   . TONSILLECTOMY         Home Medications    Prior to Admission medications   Medication Sig Start Date End Date Taking? Authorizing Provider  Cyanocobalamin (B-12) 100 MCG TABS take 1 tablet daily   Yes [provider]  cyclobenzaprine (FLEXERIL) 10 MG tablet Take 1 tablet by mouth 3 (three) times daily. Dr Alfredo Batty 07/13/14  Yes [provider]  Flaxseed, Linseed, (FLAX SEED OIL PO) Take 1 capsule by mouth daily.   Yes [provider]  folic acid (FOLVITE) 1 MG tablet Take 1 mg by mouth. 01/20/13  Yes [provider]  Golimumab 50 MG/0.5ML SOAJ Inject 50 mg into the skin. 09/17/12  Yes [provider]  methotrexate 50 MG/2ML injection Inject 1 mL into the muscle once a week. Dr Alfredo Batty 07/14/14  Yes [provider]  mupirocin ointment (BACTROBAN) 2 % Apply 1 application topically 2 (two) times daily. 07/26/17  Yes Duanne Limerick, MD  Omega-3 Fatty Acids  (FISH OIL) 1000 MG CAPS Take 1 capsule daily   Yes [provider]  Vitamin D, Cholecalciferol, 400 units TABS Take 1 tablet by mouth 1 day or 1 dose.   Yes [provider]  cefdinir (OMNICEF) 300 MG capsule Take 1 capsule (300 mg total) by mouth 2 (two) times daily. 10/27/17   Tommie Sams, DO  Naproxen Sodium (ALEVE) 220 MG CAPS Aleve 220 mg capsule  prn    [provider]  traMADol (ULTRAM) 50 MG tablet Take 1 tablet (50 mg total) by mouth every 8 (eight) hours as needed. 10/27/17   Tommie Sams, DO    Family History Family History  Problem Relation Age of Onset  . Diabetes Mother   . Cancer Father        prostate  . Cancer Paternal Grandfather        colon    Social History Social History   Tobacco Use  . Smoking status: Current Every Day Smoker    Packs/day: 0.50    Years: 27.00    Pack years: 13.50    Types: Cigarettes  . Smokeless tobacco: Never Used  Substance Use Topics  . Alcohol use: Yes    Alcohol/week: 2.0 standard drinks    Types: 2 Cans of beer per week  . Drug use: Yes    Frequency: 7.0 times per week    Types: Marijuana    Comment: smokes once a day  Allergies   Patient has no known allergies.   Review of Systems Review of Systems  Constitutional: Negative.   HENT: Positive for ear pain.    Physical Exam Triage Vital Signs ED Triage Vitals  Enc Vitals Group     BP 10/27/17 1235 (!) 139/100     Pulse Rate 10/27/17 1235 (!) 108     Resp 10/27/17 1235 18     Temp 10/27/17 1235 98.2 F (36.8 C)     Temp Source 10/27/17 1235 Oral     SpO2 10/27/17 1235 98 %     Weight 10/27/17 1237 196 lb 10.4 oz (89.2 kg)     Height --      Head Circumference --      Peak Flow --      Pain Score 10/27/17 1237 9     Pain Loc --      Pain Edu? --      Excl. in [place]? --    Updated Vital Signs BP (!) 139/100 (BP Location: Left Arm)   Pulse (!) 108   Temp 98.2 F (36.8 C) (Oral)   Resp 18   Wt 89.2 kg   SpO2 98%   BMI  30.80 kg/m   Visual Acuity Right Eye Distance:   Left Eye Distance:   Bilateral Distance:    Right Eye Near:   Left Eye Near:    Bilateral Near:     Physical Exam  Constitutional: He is oriented to person, place, and time. He appears well-developed. No distress.  HENT:  Head: Normocephalic and atraumatic.  Left TM with dullness. No appreciable swelling of the parotid gland.  Neck:  Supple.  No appreciable adenopathy.  Patient does endorse tenderness to palpation in the anterior cervical region.  Cardiovascular: Regular rhythm.  Tachycardic.  Pulmonary/Chest: Effort normal and breath sounds normal. He has no wheezes. He has no rales.  Neurological: He is alert and oriented to person, place, and time.  Psychiatric: His behavior is normal.  Flat affect.  Nursing note and vitals reviewed.   UC Treatments / Results  Labs (all labs ordered are listed, but only abnormal results are displayed) Labs Reviewed - No data to display  EKG None  Radiology No results found.  Procedures Procedures (including critical care time)  Medications Ordered in UC Medications - No data to display  Initial Impression / Assessment and Plan / UC Course  I have reviewed the triage vital signs and the nursing notes.  Pertinent labs & imaging results that were available during my care of the patient were reviewed by me and considered in my medical decision making (see chart for details).    52 year old male presents with left ear pain and pain in the surrounding areas.  Does not appear to be parotiditis.  Could be dental in origin.  Placing on Omnicef.  Tramadol for pain.  Supportive care.  Final Clinical Impressions(s) / UC Diagnoses   Final diagnoses:  Otalgia of left ear     Discharge Instructions     Medication as prescribed.  Continue your regular medication. Use the pain medication as needed.   Take care  Dr. Adriana Simas    ED Prescriptions    Medication Sig Dispense Auth.  Provider   cefdinir (OMNICEF) 300 MG capsule Take 1 capsule (300 mg total) by mouth 2 (two) times daily. 14 capsule Eriq Hufford G, DO   traMADol (ULTRAM) 50 MG tablet Take 1 tablet (50 mg total) by mouth every 8 (eight)  hours as needed. 10 tablet Tommie Sams, DO     Controlled Substance Prescriptions  Controlled Substance Registry consulted? Not Applicable   Tommie Sams, DO 10/27/17 1330

## 2017-10-27 NOTE — ED Triage Notes (Signed)
Pt having left ear pain since last night and states the pain is radiating down his neck. Was unable to sleep last night and did take tylenol without relief. Pt is holding his head to the right due to the pain.

## 2017-10-27 NOTE — Discharge Instructions (Addendum)
Medication as prescribed.  Continue your regular medication. Use the pain medication as needed.   Take care  Dr. Adriana Simas

## 2017-11-07 DIAGNOSIS — F54 Psychological and behavioral factors associated with disorders or diseases classified elsewhere: Secondary | ICD-10-CM | POA: Diagnosis not present

## 2017-11-07 DIAGNOSIS — F4312 Post-traumatic stress disorder, chronic: Secondary | ICD-10-CM | POA: Diagnosis not present

## 2017-11-15 DIAGNOSIS — Z23 Encounter for immunization: Secondary | ICD-10-CM | POA: Diagnosis not present

## 2017-12-07 DIAGNOSIS — F54 Psychological and behavioral factors associated with disorders or diseases classified elsewhere: Secondary | ICD-10-CM | POA: Diagnosis not present

## 2017-12-07 DIAGNOSIS — F4312 Post-traumatic stress disorder, chronic: Secondary | ICD-10-CM | POA: Diagnosis not present

## 2017-12-13 DIAGNOSIS — M069 Rheumatoid arthritis, unspecified: Secondary | ICD-10-CM | POA: Diagnosis not present

## 2017-12-13 DIAGNOSIS — M0579 Rheumatoid arthritis with rheumatoid factor of multiple sites without organ or systems involvement: Secondary | ICD-10-CM | POA: Diagnosis not present

## 2017-12-13 DIAGNOSIS — M150 Primary generalized (osteo)arthritis: Secondary | ICD-10-CM | POA: Diagnosis not present

## 2017-12-17 DIAGNOSIS — F54 Psychological and behavioral factors associated with disorders or diseases classified elsewhere: Secondary | ICD-10-CM | POA: Diagnosis not present

## 2017-12-17 DIAGNOSIS — F4312 Post-traumatic stress disorder, chronic: Secondary | ICD-10-CM | POA: Diagnosis not present

## 2017-12-25 DIAGNOSIS — F54 Psychological and behavioral factors associated with disorders or diseases classified elsewhere: Secondary | ICD-10-CM | POA: Diagnosis not present

## 2017-12-25 DIAGNOSIS — F4312 Post-traumatic stress disorder, chronic: Secondary | ICD-10-CM | POA: Diagnosis not present

## 2018-01-02 DIAGNOSIS — F4312 Post-traumatic stress disorder, chronic: Secondary | ICD-10-CM | POA: Diagnosis not present

## 2018-01-02 DIAGNOSIS — F54 Psychological and behavioral factors associated with disorders or diseases classified elsewhere: Secondary | ICD-10-CM | POA: Diagnosis not present

## 2018-01-07 DIAGNOSIS — R748 Abnormal levels of other serum enzymes: Secondary | ICD-10-CM | POA: Diagnosis not present

## 2018-01-16 DIAGNOSIS — F54 Psychological and behavioral factors associated with disorders or diseases classified elsewhere: Secondary | ICD-10-CM | POA: Diagnosis not present

## 2018-01-16 DIAGNOSIS — F4312 Post-traumatic stress disorder, chronic: Secondary | ICD-10-CM | POA: Diagnosis not present

## 2018-01-23 DIAGNOSIS — F4312 Post-traumatic stress disorder, chronic: Secondary | ICD-10-CM | POA: Diagnosis not present

## 2018-01-23 DIAGNOSIS — F54 Psychological and behavioral factors associated with disorders or diseases classified elsewhere: Secondary | ICD-10-CM | POA: Diagnosis not present

## 2018-01-30 DIAGNOSIS — F54 Psychological and behavioral factors associated with disorders or diseases classified elsewhere: Secondary | ICD-10-CM | POA: Diagnosis not present

## 2018-01-30 DIAGNOSIS — F4312 Post-traumatic stress disorder, chronic: Secondary | ICD-10-CM | POA: Diagnosis not present

## 2018-02-20 DIAGNOSIS — F4312 Post-traumatic stress disorder, chronic: Secondary | ICD-10-CM | POA: Diagnosis not present

## 2018-02-20 DIAGNOSIS — F54 Psychological and behavioral factors associated with disorders or diseases classified elsewhere: Secondary | ICD-10-CM | POA: Diagnosis not present

## 2018-02-27 DIAGNOSIS — F54 Psychological and behavioral factors associated with disorders or diseases classified elsewhere: Secondary | ICD-10-CM | POA: Diagnosis not present

## 2018-02-27 DIAGNOSIS — F4312 Post-traumatic stress disorder, chronic: Secondary | ICD-10-CM | POA: Diagnosis not present

## 2018-03-01 ENCOUNTER — Encounter: Payer: Self-pay | Admitting: Family Medicine

## 2018-03-01 ENCOUNTER — Ambulatory Visit (INDEPENDENT_AMBULATORY_CARE_PROVIDER_SITE_OTHER): Payer: Medicare Other | Admitting: Family Medicine

## 2018-03-01 VITALS — BP 130/80 | HR 80 | Ht 67.0 in | Wt 202.0 lb

## 2018-03-01 DIAGNOSIS — B356 Tinea cruris: Secondary | ICD-10-CM

## 2018-03-01 DIAGNOSIS — B3749 Other urogenital candidiasis: Secondary | ICD-10-CM | POA: Diagnosis not present

## 2018-03-01 DIAGNOSIS — L03314 Cellulitis of groin: Secondary | ICD-10-CM

## 2018-03-01 MED ORDER — KETOCONAZOLE 2 % EX CREA
1.0000 | TOPICAL_CREAM | Freq: Every day | CUTANEOUS | 1 refills | Status: DC
Start: 2018-03-01 — End: 2019-04-04

## 2018-03-01 MED ORDER — DOXYCYCLINE HYCLATE 100 MG PO TABS: 100.0000 mg | ORAL_TABLET | Freq: Two times a day (BID) | ORAL | 0 refills | Status: DC

## 2018-03-01 MED ORDER — FLUCONAZOLE 150 MG PO TABS: 150.0000 mg | ORAL_TABLET | Freq: Once | ORAL | 0 refills | Status: AC

## 2018-03-01 NOTE — Progress Notes (Signed)
Date:  03/01/2018   Name:  Patrick Perez   DOB:  Jun 04, 1965   MRN:  [id number]   Chief Complaint: Rash (between legs and on testicles- tried lotrimin and gold bond- red, itching and "losing layers of skin")  Rash  This is a recurrent problem. The current episode started 1 to 4 weeks ago (3 weeks). The problem has been gradually worsening since onset. The affected locations include the genitalia and groin. He was exposed to nothing. Pertinent negatives include no cough, diarrhea, fever, shortness of breath or sore throat. (Pruritis) Treatments tried: moisturizer/ desitin/ lotrimen/  The treatment provided no relief.    Review of Systems  Constitutional: Negative for chills and fever.  HENT: Negative for drooling, ear discharge, ear pain and sore throat.   Respiratory: Negative for cough, shortness of breath and wheezing.   Cardiovascular: Negative for chest pain, palpitations and leg swelling.  Gastrointestinal: Negative for abdominal pain, blood in stool, constipation, diarrhea and nausea.  Endocrine: Negative for polydipsia.  Genitourinary: Negative for dysuria, frequency, hematuria and urgency.  Musculoskeletal: Negative for back pain, myalgias and neck pain.  Skin: Positive for rash.  Allergic/Immunologic: Negative for environmental allergies.  Neurological: Negative for dizziness and headaches.  Hematological: Does not bruise/bleed easily.  Psychiatric/Behavioral: Negative for suicidal ideas. The patient is not nervous/anxious.     Patient Active Problem List   Diagnosis Date Noted  . RA (rheumatoid arthritis) (HCC) 09/13/2015  . Rheumatoid arthritis (HCC) 09/13/2015  . Laceration of right hand with complication 08/10/2014    No Known Allergies  Past Surgical History:  Procedure Laterality Date  . TIBIA FRACTURE SURGERY Left   . TONSILLECTOMY      Social History   Tobacco Use  . Smoking status: Current Every Day Smoker    Packs/day: 0.50    Years: 27.00    Pack  years: 13.50    Types: Cigarettes  . Smokeless tobacco: Never Used  Substance Use Topics  . Alcohol use: Yes    Alcohol/week: 2.0 standard drinks    Types: 2 Cans of beer per week  . Drug use: Yes    Frequency: 7.0 times per week    Types: Marijuana    Comment: smokes once a day      Medication list has been reviewed and updated.  Current Meds  Medication Sig  . Cyanocobalamin (B-12) 100 MCG TABS take 1 tablet daily  . cyclobenzaprine (FLEXERIL) 10 MG tablet Take 1 tablet by mouth 3 (three) times daily. Dr Alfredo Batty  . Flaxseed, Linseed, (FLAX SEED OIL PO) Take 1 capsule by mouth daily.  . folic acid (FOLVITE) 1 MG tablet Take 1 mg by mouth.  . Golimumab 50 MG/0.5ML SOAJ Inject 50 mg into the skin.  . methotrexate 50 MG/2ML injection Inject 1 mL into the muscle once a week. Dr Alfredo Batty  . mupirocin ointment (BACTROBAN) 2 % Apply 1 application topically 2 (two) times daily.  . Naproxen Sodium (ALEVE) 220 MG CAPS Aleve 220 mg capsule  prn  . Omega-3 Fatty Acids (FISH OIL) 1000 MG CAPS Take 1 capsule daily  . traMADol (ULTRAM) 50 MG tablet Take 1 tablet (50 mg total) by mouth every 8 (eight) hours as needed.  . Vitamin D, Cholecalciferol, 400 units TABS Take 1 tablet by mouth 1 day or 1 dose.    PHQ 2/9 Scores 08/29/2017 08/28/2016  PHQ - 2 Score 4 0  PHQ- 9 Score 10 -    Physical Exam Vitals signs and nursing  note reviewed.  HENT:     Head: Normocephalic.     Right Ear: External ear Patrick.     Left Ear: External ear Patrick.     Nose: Nose Patrick.  Eyes:     General: No scleral icterus.       Right eye: No discharge.        Left eye: No discharge.     Conjunctiva/sclera: Conjunctivae Patrick.     Pupils: Pupils are equal, round, and reactive to light.  Neck:     Musculoskeletal: Patrick range of motion and neck supple.     Thyroid: No thyromegaly.     Vascular: No JVD.     Trachea: No tracheal deviation.  Cardiovascular:     Rate and Rhythm: Patrick rate and regular  rhythm.     Heart sounds: Patrick heart sounds. No murmur. No friction rub. No gallop.   Pulmonary:     Effort: No respiratory distress.     Breath sounds: Patrick breath sounds. No wheezing or rales.  Abdominal:     General: Bowel sounds are Patrick.     Palpations: Abdomen is soft. There is no mass.     Tenderness: There is no abdominal tenderness. There is no guarding or rebound.  Musculoskeletal: Patrick range of motion.        General: No tenderness.  Lymphadenopathy:     Cervical: No cervical adenopathy.  Skin:    General: Skin is warm.     Findings: Erythema present. No rash. Macular rash: excoriations.     Comments: Excoriations/scrotal  Neurological:     Mental Status: He is alert and oriented to person, place, and time.     Cranial Nerves: No cranial nerve deficit.     Deep Tendon Reflexes: Reflexes are Patrick and symmetric.     BP 130/80   Pulse 80   Ht 5\' 7"  (1.702 m)   Wt 202 lb (91.6 kg)   BMI 31.64 kg/m   Assessment and Plan: 1. Tinea cruris New onset.  Persistent.  Probably secondary infected tinea cruris.  Will treat with Xarelto cream.  Apply twice a day and doxycycline 100 mg twice daily.  2. Genital candidiasis in male Patient's exam is consistent with candidiasis and Diflucan 150 mg will be initiated prior to therapy and completion of antibiotic therapy. 3. Secondary bacterial infection.  Will treat with doxycycline 100 mg twice a day.

## 2018-03-01 NOTE — Patient Instructions (Signed)
Patrick Perez  Patrick Perez (tinea cruris) is an infection of the skin in the groin area. It is caused by a fungus, which is a type of germ that lives in dark, damp places. Patrick Perez causes an itchy rash in the groin and upper thigh area. It usually goes away in 2-3 weeks with treatment.  What are the causes?  The fungus that causes Patrick Perez may be spread by:  · Touching a fungus infection elsewhere on your body, such as athlete's foot, and then touching your groin area.  · Sharing towels or clothing, such as socks or shoes, with someone who has a fungal infection.  What increases the risk?  Patrick Perez is most common in men and adolescent boys. You are also more likely to develop the condition if you:  · Are in a hot, humid climate.  · Wear tight-fitting clothing or wet bathing suits for long periods of time.  · Play sports.  · Are overweight.  · Have diabetes.  · Have a weakened immune system.  · Sweat a lot.  What are the signs or symptoms?  Symptoms of Patrick Perez may include:  · A red, pink, or brown rash in the groin area. Blisters may be present. The rash may spread to the thighs, anus, and buttocks.  · Dry and scaly skin on or around the rash.  · Itchiness.  How is this diagnosed?  In most cases, your health care provider can make the diagnosis by looking at your rash. In some cases, a sample of infected skin may be scraped off. This sample may be examined under a microscope (biopsy) or by trying to grow the fungus from the sample (culture).  How is this treated?  Treatment for this condition may include:  · Antifungal medicine to kill the fungus. This may be a skin cream, ointment, or powder, or it may be a medicine that you take by mouth.  · Skin cream or ointment to reduce itching.  · Lifestyle changes, such as wearing looser clothing and caring for your skin.  Follow these instructions at home:  Skin care  · Apply skin creams, ointments, or powders exactly as told by your health care provider.  · Wear  loose-fitting clothing that does not rub against your groin area. Men should wear boxer shorts or loose-fitting underwear.  · Keep your groin area clean and dry.  ? Change your underwear every day.  ? Change out of wet bathing suits as soon as possible.  ? After bathing, use a separate towel to dry your groin area thoroughly and gently. Using a separate towel will help prevent spreading the infection to other areas of your body.  · Avoid hot baths and showers. Hot water can make itching worse.  · Do not scratch the affected area.  General instructions  · Take and apply over-the-counter and prescription medicines only as told by your health care provider.  · Do not share towels, clothing, or personal items with other people.  · Wash your hands often with soap and water, especially after touching your groin area. If soap and water are not available, use alcohol-based hand sanitizer.  Contact a health care provider if:  · Your rash:  ? Gets worse or does not get better after 2 weeks of treatment.  ? Spreads.  ? Returns after treatment is finished.  · You have any of the following:  ? A fever.  ? New or worsening redness, swelling, or pain around   your rash.  ? Fluid, blood, or pus coming from your rash.  Summary  · Patrick Perez (tinea cruris) is a fungal infection of the skin in the groin area.  · The fungus can be spread by sharing clothing or by touching a fungus infection elsewhere on your body and then touching your groin area.  · Treatment may include antifungal medicine and lifestyle changes, such as keeping the area clean and dry.  This information is not intended to replace advice given to you by your health care provider. Make sure you discuss any questions you have with your health care provider.  Document Released: 01/20/2002 Document Revised: 01/10/2017 Document Reviewed: 01/10/2017  Elsevier Interactive Patient Education © 2019 [employer]

## 2018-03-06 DIAGNOSIS — F4312 Post-traumatic stress disorder, chronic: Secondary | ICD-10-CM | POA: Diagnosis not present

## 2018-03-06 DIAGNOSIS — F54 Psychological and behavioral factors associated with disorders or diseases classified elsewhere: Secondary | ICD-10-CM | POA: Diagnosis not present

## 2018-03-13 DIAGNOSIS — F4312 Post-traumatic stress disorder, chronic: Secondary | ICD-10-CM | POA: Diagnosis not present

## 2018-03-13 DIAGNOSIS — F54 Psychological and behavioral factors associated with disorders or diseases classified elsewhere: Secondary | ICD-10-CM | POA: Diagnosis not present

## 2018-03-20 DIAGNOSIS — F4312 Post-traumatic stress disorder, chronic: Secondary | ICD-10-CM | POA: Diagnosis not present

## 2018-03-20 DIAGNOSIS — F54 Psychological and behavioral factors associated with disorders or diseases classified elsewhere: Secondary | ICD-10-CM | POA: Diagnosis not present

## 2018-03-27 DIAGNOSIS — F4312 Post-traumatic stress disorder, chronic: Secondary | ICD-10-CM | POA: Diagnosis not present

## 2018-03-27 DIAGNOSIS — F54 Psychological and behavioral factors associated with disorders or diseases classified elsewhere: Secondary | ICD-10-CM | POA: Diagnosis not present

## 2018-04-04 DIAGNOSIS — M0579 Rheumatoid arthritis with rheumatoid factor of multiple sites without organ or systems involvement: Secondary | ICD-10-CM | POA: Diagnosis not present

## 2018-04-04 DIAGNOSIS — M150 Primary generalized (osteo)arthritis: Secondary | ICD-10-CM | POA: Diagnosis not present

## 2018-04-08 DIAGNOSIS — M069 Rheumatoid arthritis, unspecified: Secondary | ICD-10-CM | POA: Diagnosis not present

## 2018-04-10 DIAGNOSIS — F54 Psychological and behavioral factors associated with disorders or diseases classified elsewhere: Secondary | ICD-10-CM | POA: Diagnosis not present

## 2018-04-10 DIAGNOSIS — F4312 Post-traumatic stress disorder, chronic: Secondary | ICD-10-CM | POA: Diagnosis not present

## 2018-04-11 DIAGNOSIS — Z79899 Other long term (current) drug therapy: Secondary | ICD-10-CM | POA: Diagnosis not present

## 2018-04-11 DIAGNOSIS — Z5181 Encounter for therapeutic drug level monitoring: Secondary | ICD-10-CM | POA: Diagnosis not present

## 2018-04-17 DIAGNOSIS — F54 Psychological and behavioral factors associated with disorders or diseases classified elsewhere: Secondary | ICD-10-CM | POA: Diagnosis not present

## 2018-04-17 DIAGNOSIS — F4312 Post-traumatic stress disorder, chronic: Secondary | ICD-10-CM | POA: Diagnosis not present

## 2018-05-01 DIAGNOSIS — F54 Psychological and behavioral factors associated with disorders or diseases classified elsewhere: Secondary | ICD-10-CM | POA: Diagnosis not present

## 2018-05-01 DIAGNOSIS — F4312 Post-traumatic stress disorder, chronic: Secondary | ICD-10-CM | POA: Diagnosis not present

## 2018-05-08 DIAGNOSIS — F4312 Post-traumatic stress disorder, chronic: Secondary | ICD-10-CM | POA: Diagnosis not present

## 2018-05-08 DIAGNOSIS — F54 Psychological and behavioral factors associated with disorders or diseases classified elsewhere: Secondary | ICD-10-CM | POA: Diagnosis not present

## 2018-05-16 DIAGNOSIS — F54 Psychological and behavioral factors associated with disorders or diseases classified elsewhere: Secondary | ICD-10-CM | POA: Diagnosis not present

## 2018-05-16 DIAGNOSIS — F4312 Post-traumatic stress disorder, chronic: Secondary | ICD-10-CM | POA: Diagnosis not present

## 2018-05-21 DIAGNOSIS — F54 Psychological and behavioral factors associated with disorders or diseases classified elsewhere: Secondary | ICD-10-CM | POA: Diagnosis not present

## 2018-05-21 DIAGNOSIS — F4312 Post-traumatic stress disorder, chronic: Secondary | ICD-10-CM | POA: Diagnosis not present

## 2018-05-29 DIAGNOSIS — F4312 Post-traumatic stress disorder, chronic: Secondary | ICD-10-CM | POA: Diagnosis not present

## 2018-05-29 DIAGNOSIS — F54 Psychological and behavioral factors associated with disorders or diseases classified elsewhere: Secondary | ICD-10-CM | POA: Diagnosis not present

## 2018-06-06 DIAGNOSIS — F54 Psychological and behavioral factors associated with disorders or diseases classified elsewhere: Secondary | ICD-10-CM | POA: Diagnosis not present

## 2018-06-06 DIAGNOSIS — F4312 Post-traumatic stress disorder, chronic: Secondary | ICD-10-CM | POA: Diagnosis not present

## 2018-06-12 DIAGNOSIS — F4312 Post-traumatic stress disorder, chronic: Secondary | ICD-10-CM | POA: Diagnosis not present

## 2018-06-12 DIAGNOSIS — F54 Psychological and behavioral factors associated with disorders or diseases classified elsewhere: Secondary | ICD-10-CM | POA: Diagnosis not present

## 2018-06-19 DIAGNOSIS — F4312 Post-traumatic stress disorder, chronic: Secondary | ICD-10-CM | POA: Diagnosis not present

## 2018-06-19 DIAGNOSIS — F54 Psychological and behavioral factors associated with disorders or diseases classified elsewhere: Secondary | ICD-10-CM | POA: Diagnosis not present

## 2018-06-26 DIAGNOSIS — F54 Psychological and behavioral factors associated with disorders or diseases classified elsewhere: Secondary | ICD-10-CM | POA: Diagnosis not present

## 2018-06-26 DIAGNOSIS — F4312 Post-traumatic stress disorder, chronic: Secondary | ICD-10-CM | POA: Diagnosis not present

## 2018-07-03 DIAGNOSIS — F54 Psychological and behavioral factors associated with disorders or diseases classified elsewhere: Secondary | ICD-10-CM | POA: Diagnosis not present

## 2018-07-03 DIAGNOSIS — F4312 Post-traumatic stress disorder, chronic: Secondary | ICD-10-CM | POA: Diagnosis not present

## 2018-07-10 DIAGNOSIS — F54 Psychological and behavioral factors associated with disorders or diseases classified elsewhere: Secondary | ICD-10-CM | POA: Diagnosis not present

## 2018-07-10 DIAGNOSIS — F4312 Post-traumatic stress disorder, chronic: Secondary | ICD-10-CM | POA: Diagnosis not present

## 2018-07-17 DIAGNOSIS — F4312 Post-traumatic stress disorder, chronic: Secondary | ICD-10-CM | POA: Diagnosis not present

## 2018-07-17 DIAGNOSIS — F54 Psychological and behavioral factors associated with disorders or diseases classified elsewhere: Secondary | ICD-10-CM | POA: Diagnosis not present

## 2018-07-18 DIAGNOSIS — Z5181 Encounter for therapeutic drug level monitoring: Secondary | ICD-10-CM | POA: Diagnosis not present

## 2018-07-18 DIAGNOSIS — M150 Primary generalized (osteo)arthritis: Secondary | ICD-10-CM | POA: Diagnosis not present

## 2018-07-18 DIAGNOSIS — M0579 Rheumatoid arthritis with rheumatoid factor of multiple sites without organ or systems involvement: Secondary | ICD-10-CM | POA: Diagnosis not present

## 2018-07-18 DIAGNOSIS — Z79899 Other long term (current) drug therapy: Secondary | ICD-10-CM | POA: Diagnosis not present

## 2018-07-24 DIAGNOSIS — F54 Psychological and behavioral factors associated with disorders or diseases classified elsewhere: Secondary | ICD-10-CM | POA: Diagnosis not present

## 2018-07-24 DIAGNOSIS — F4312 Post-traumatic stress disorder, chronic: Secondary | ICD-10-CM | POA: Diagnosis not present

## 2018-07-31 DIAGNOSIS — F54 Psychological and behavioral factors associated with disorders or diseases classified elsewhere: Secondary | ICD-10-CM | POA: Diagnosis not present

## 2018-07-31 DIAGNOSIS — F4312 Post-traumatic stress disorder, chronic: Secondary | ICD-10-CM | POA: Diagnosis not present

## 2018-08-07 DIAGNOSIS — F54 Psychological and behavioral factors associated with disorders or diseases classified elsewhere: Secondary | ICD-10-CM | POA: Diagnosis not present

## 2018-08-07 DIAGNOSIS — F4312 Post-traumatic stress disorder, chronic: Secondary | ICD-10-CM | POA: Diagnosis not present

## 2018-08-14 DIAGNOSIS — F4312 Post-traumatic stress disorder, chronic: Secondary | ICD-10-CM | POA: Diagnosis not present

## 2018-08-14 DIAGNOSIS — F54 Psychological and behavioral factors associated with disorders or diseases classified elsewhere: Secondary | ICD-10-CM | POA: Diagnosis not present

## 2018-08-21 DIAGNOSIS — F54 Psychological and behavioral factors associated with disorders or diseases classified elsewhere: Secondary | ICD-10-CM | POA: Diagnosis not present

## 2018-08-21 DIAGNOSIS — F4312 Post-traumatic stress disorder, chronic: Secondary | ICD-10-CM | POA: Diagnosis not present

## 2018-08-28 DIAGNOSIS — F54 Psychological and behavioral factors associated with disorders or diseases classified elsewhere: Secondary | ICD-10-CM | POA: Diagnosis not present

## 2018-08-28 DIAGNOSIS — F4312 Post-traumatic stress disorder, chronic: Secondary | ICD-10-CM | POA: Diagnosis not present

## 2018-09-02 ENCOUNTER — Ambulatory Visit: Payer: Self-pay

## 2018-09-11 DIAGNOSIS — F4312 Post-traumatic stress disorder, chronic: Secondary | ICD-10-CM | POA: Diagnosis not present

## 2018-09-11 DIAGNOSIS — F54 Psychological and behavioral factors associated with disorders or diseases classified elsewhere: Secondary | ICD-10-CM | POA: Diagnosis not present

## 2018-09-18 DIAGNOSIS — F54 Psychological and behavioral factors associated with disorders or diseases classified elsewhere: Secondary | ICD-10-CM | POA: Diagnosis not present

## 2018-09-18 DIAGNOSIS — F4312 Post-traumatic stress disorder, chronic: Secondary | ICD-10-CM | POA: Diagnosis not present

## 2018-09-25 DIAGNOSIS — F54 Psychological and behavioral factors associated with disorders or diseases classified elsewhere: Secondary | ICD-10-CM | POA: Diagnosis not present

## 2018-09-25 DIAGNOSIS — F4312 Post-traumatic stress disorder, chronic: Secondary | ICD-10-CM | POA: Diagnosis not present

## 2018-10-02 DIAGNOSIS — F4312 Post-traumatic stress disorder, chronic: Secondary | ICD-10-CM | POA: Diagnosis not present

## 2018-10-02 DIAGNOSIS — F54 Psychological and behavioral factors associated with disorders or diseases classified elsewhere: Secondary | ICD-10-CM | POA: Diagnosis not present

## 2018-10-09 DIAGNOSIS — F54 Psychological and behavioral factors associated with disorders or diseases classified elsewhere: Secondary | ICD-10-CM | POA: Diagnosis not present

## 2018-10-09 DIAGNOSIS — F4312 Post-traumatic stress disorder, chronic: Secondary | ICD-10-CM | POA: Diagnosis not present

## 2018-10-22 DIAGNOSIS — Z79891 Long term (current) use of opiate analgesic: Secondary | ICD-10-CM | POA: Diagnosis not present

## 2018-10-22 DIAGNOSIS — Z5181 Encounter for therapeutic drug level monitoring: Secondary | ICD-10-CM | POA: Diagnosis not present

## 2018-10-22 DIAGNOSIS — M150 Primary generalized (osteo)arthritis: Secondary | ICD-10-CM | POA: Diagnosis not present

## 2018-10-22 DIAGNOSIS — M0579 Rheumatoid arthritis with rheumatoid factor of multiple sites without organ or systems involvement: Secondary | ICD-10-CM | POA: Diagnosis not present

## 2018-10-22 DIAGNOSIS — Z79899 Other long term (current) drug therapy: Secondary | ICD-10-CM | POA: Diagnosis not present

## 2018-10-23 DIAGNOSIS — F54 Psychological and behavioral factors associated with disorders or diseases classified elsewhere: Secondary | ICD-10-CM | POA: Diagnosis not present

## 2018-10-23 DIAGNOSIS — F4312 Post-traumatic stress disorder, chronic: Secondary | ICD-10-CM | POA: Diagnosis not present

## 2018-10-30 DIAGNOSIS — F4312 Post-traumatic stress disorder, chronic: Secondary | ICD-10-CM | POA: Diagnosis not present

## 2018-10-30 DIAGNOSIS — F54 Psychological and behavioral factors associated with disorders or diseases classified elsewhere: Secondary | ICD-10-CM | POA: Diagnosis not present

## 2018-11-06 DIAGNOSIS — F4312 Post-traumatic stress disorder, chronic: Secondary | ICD-10-CM | POA: Diagnosis not present

## 2018-11-06 DIAGNOSIS — F54 Psychological and behavioral factors associated with disorders or diseases classified elsewhere: Secondary | ICD-10-CM | POA: Diagnosis not present

## 2018-11-13 DIAGNOSIS — F4312 Post-traumatic stress disorder, chronic: Secondary | ICD-10-CM | POA: Diagnosis not present

## 2018-11-13 DIAGNOSIS — F54 Psychological and behavioral factors associated with disorders or diseases classified elsewhere: Secondary | ICD-10-CM | POA: Diagnosis not present

## 2018-11-20 DIAGNOSIS — F54 Psychological and behavioral factors associated with disorders or diseases classified elsewhere: Secondary | ICD-10-CM | POA: Diagnosis not present

## 2018-11-20 DIAGNOSIS — F4312 Post-traumatic stress disorder, chronic: Secondary | ICD-10-CM | POA: Diagnosis not present

## 2018-11-27 DIAGNOSIS — F54 Psychological and behavioral factors associated with disorders or diseases classified elsewhere: Secondary | ICD-10-CM | POA: Diagnosis not present

## 2018-11-27 DIAGNOSIS — F4312 Post-traumatic stress disorder, chronic: Secondary | ICD-10-CM | POA: Diagnosis not present

## 2018-12-04 DIAGNOSIS — F4312 Post-traumatic stress disorder, chronic: Secondary | ICD-10-CM | POA: Diagnosis not present

## 2018-12-04 DIAGNOSIS — F54 Psychological and behavioral factors associated with disorders or diseases classified elsewhere: Secondary | ICD-10-CM | POA: Diagnosis not present

## 2018-12-11 DIAGNOSIS — F54 Psychological and behavioral factors associated with disorders or diseases classified elsewhere: Secondary | ICD-10-CM | POA: Diagnosis not present

## 2018-12-11 DIAGNOSIS — F4312 Post-traumatic stress disorder, chronic: Secondary | ICD-10-CM | POA: Diagnosis not present

## 2018-12-18 DIAGNOSIS — F4312 Post-traumatic stress disorder, chronic: Secondary | ICD-10-CM | POA: Diagnosis not present

## 2018-12-18 DIAGNOSIS — F54 Psychological and behavioral factors associated with disorders or diseases classified elsewhere: Secondary | ICD-10-CM | POA: Diagnosis not present

## 2018-12-27 DIAGNOSIS — Z23 Encounter for immunization: Secondary | ICD-10-CM | POA: Diagnosis not present

## 2019-01-01 DIAGNOSIS — F54 Psychological and behavioral factors associated with disorders or diseases classified elsewhere: Secondary | ICD-10-CM | POA: Diagnosis not present

## 2019-01-01 DIAGNOSIS — F4312 Post-traumatic stress disorder, chronic: Secondary | ICD-10-CM | POA: Diagnosis not present

## 2019-01-02 ENCOUNTER — Telehealth: Payer: Self-pay

## 2019-01-02 NOTE — Telephone Encounter (Signed)
I called pt to ask him if he needed an appt for a referral to psychiatry. He stated that he needed to be seen "right now." Dr Tollie Pizza has been seeing him, but is unable to prescribe medicines to help him. I offered for [place] to see him and put a referral in. However, I also explained that it may be 1-2 weeks before he can get in. Therefore, advised him if he is having this many issues with "PTSD, Flashbacks, not suicidal because I can't say that in front of my kids, anxious, angry..." he needs to go to [facility] or [facility] so they can get him seen and set up with psychiatry and possibly work with the [place] in conjuction with psychiatry. Pt said he refuses to go to [place] due to "they charged me and wouldn't pay for my bill and I got the government involved." Pt ended conversation with "I suppose I'll do my own leg work and figure it out."

## 2019-01-22 DIAGNOSIS — F54 Psychological and behavioral factors associated with disorders or diseases classified elsewhere: Secondary | ICD-10-CM | POA: Diagnosis not present

## 2019-01-22 DIAGNOSIS — F4312 Post-traumatic stress disorder, chronic: Secondary | ICD-10-CM | POA: Diagnosis not present

## 2019-02-18 DIAGNOSIS — M0579 Rheumatoid arthritis with rheumatoid factor of multiple sites without organ or systems involvement: Secondary | ICD-10-CM | POA: Diagnosis not present

## 2019-02-18 DIAGNOSIS — G2581 Restless legs syndrome: Secondary | ICD-10-CM | POA: Diagnosis not present

## 2019-02-19 DIAGNOSIS — F4312 Post-traumatic stress disorder, chronic: Secondary | ICD-10-CM | POA: Diagnosis not present

## 2019-02-19 DIAGNOSIS — F54 Psychological and behavioral factors associated with disorders or diseases classified elsewhere: Secondary | ICD-10-CM | POA: Diagnosis not present

## 2019-02-25 ENCOUNTER — Other Ambulatory Visit: Payer: Self-pay

## 2019-02-25 ENCOUNTER — Emergency Department
Admission: EM | Admit: 2019-02-25 | Discharge: 2019-02-25 | Disposition: A | Discharge status: Home / Self Care | Payer: Medicare Other | Attending: Emergency Medicine | Admitting: Emergency Medicine

## 2019-02-25 DIAGNOSIS — R4020 Unspecified coma: Secondary | ICD-10-CM | POA: Diagnosis not present

## 2019-02-25 DIAGNOSIS — R410 Disorientation, unspecified: Secondary | ICD-10-CM | POA: Diagnosis not present

## 2019-02-25 DIAGNOSIS — R4182 Altered mental status, unspecified: Secondary | ICD-10-CM | POA: Diagnosis not present

## 2019-02-25 DIAGNOSIS — F10929 Alcohol use, unspecified with intoxication, unspecified: Secondary | ICD-10-CM | POA: Diagnosis not present

## 2019-02-25 DIAGNOSIS — Z79899 Other long term (current) drug therapy: Secondary | ICD-10-CM | POA: Diagnosis not present

## 2019-02-25 DIAGNOSIS — R0902 Hypoxemia: Secondary | ICD-10-CM | POA: Diagnosis not present

## 2019-02-25 DIAGNOSIS — F17210 Nicotine dependence, cigarettes, uncomplicated: Secondary | ICD-10-CM | POA: Diagnosis not present

## 2019-02-25 DIAGNOSIS — F10920 Alcohol use, unspecified with intoxication, uncomplicated: Secondary | ICD-10-CM

## 2019-02-25 DIAGNOSIS — R404 Transient alteration of awareness: Secondary | ICD-10-CM | POA: Diagnosis not present

## 2019-02-25 LAB — COMPREHENSIVE METABOLIC PANEL
ALT: 46 U/L — ABNORMAL HIGH (ref 0–44)
AST: 105 U/L — ABNORMAL HIGH (ref 15–41)
Albumin: 3.2 g/dL — ABNORMAL LOW (ref 3.5–5.0)
Alkaline Phosphatase: 221 U/L — ABNORMAL HIGH (ref 38–126)
Anion gap: 12 (ref 5–15)
BUN: 6 mg/dL (ref 6–20)
CO2: 23 mmol/L (ref 22–32)
Calcium: 8.6 mg/dL — ABNORMAL LOW (ref 8.9–10.3)
Chloride: 101 mmol/L (ref 98–111)
Creatinine, Ser: 0.82 mg/dL (ref 0.61–1.24)
GFR calc Af Amer: >60 mL/min (ref >60)
GFR calc non Af Amer: >60 mL/min (ref >60)
Glucose, Bld: 141 mg/dL — ABNORMAL HIGH (ref 70–99)
Potassium: 3.4 mmol/L — ABNORMAL LOW (ref 3.5–5.1)
Sodium: 136 mmol/L (ref 135–145)
Total Bilirubin: 2.2 mg/dL — ABNORMAL HIGH (ref 0.3–1.2)
Total Protein: 7.3 g/dL (ref 6.5–8.1)

## 2019-02-25 LAB — URINE DRUG SCREEN, QUALITATIVE (ARMC ONLY)
Amphetamines, Ur Screen: NOT DETECTED
Barbiturates, Ur Screen: NOT DETECTED
Benzodiazepine, Ur Scrn: NOT DETECTED
Cannabinoid 50 Ng, Ur ~~LOC~~: POSITIVE — AB
Cocaine Metabolite,Ur ~~LOC~~: NOT DETECTED
MDMA (Ecstasy)Ur Screen: NOT DETECTED
Methadone Scn, Ur: NOT DETECTED
Opiate, Ur Screen: NOT DETECTED
Phencyclidine (PCP) Ur S: NOT DETECTED
Tricyclic, Ur Screen: NOT DETECTED

## 2019-02-25 LAB — CBC
HCT: 39.2 % (ref 39.0–52.0)
Hemoglobin: 13.9 g/dL (ref 13.0–17.0)
MCH: 34.1 pg — ABNORMAL HIGH (ref 26.0–34.0)
MCHC: 35.5 g/dL (ref 30.0–36.0)
MCV: 96.1 fL (ref 80.0–100.0)
Platelets: 165 10*3/uL (ref 150–400)
RBC: 4.08 MIL/uL — ABNORMAL LOW (ref 4.22–5.81)
RDW: 15.4 % (ref 11.5–15.5)
WBC: 8.8 10*3/uL (ref 4.0–10.5)
nRBC: 0 % (ref 0.0–0.2)

## 2019-02-25 LAB — ETHANOL: Alcohol, Ethyl (B): 285 mg/dL — ABNORMAL HIGH (ref <10)

## 2019-02-25 NOTE — ED Triage Notes (Signed)
Pt significant other here and reports pt has PTSD and that he was threatening suicide. Pt girlfriend reports she showed up when he stopped breathing. Pt also with rheumatoid arthritis. Art Buff [phone], [phone]

## 2019-02-25 NOTE — ED Notes (Signed)
PT  VOL  ETOH

## 2019-02-25 NOTE — ED Notes (Signed)
Pt assisted to toilet at this time 

## 2019-02-25 NOTE — ED Notes (Signed)
Pt states that he wants to go home and asks if he's legally commited. Dr. Lenard Lance notified, pt ride on the way.

## 2019-02-25 NOTE — ED Provider Notes (Signed)
[facility] Emergency Department Provider Note  Time seen: 6:34 PM  I have reviewed the triage vital signs and the nursing notes.   HISTORY  Chief Complaint Alcohol Intoxication   HPI Patrick Perez is a 54 y.o. male with a past medical history of arthritis, presents to the emergency department for evaluation.   Per report patient has significant PTSD and the girlfriend found him reportedly not breathing.  EMS states arrived the patient admits alcohol use drank a bottle of tequila.  Here patient is initially somnolent does awaken to voice, will speak to you somewhat normally and then will stop speaking and act like he is falling asleep however when he started talking to him he will start talking once again.  Possibly intoxicated, possibly related to underlying psychiatric condition.  No apparent medical concerns currently.  When I talked about alcohol use, he states he is not here for alcohol he is here for his "other conditions."  But will then no longer talk about his other conditions or what he is referring to.  Past Medical History:  Diagnosis Date  . Arthritis    rheumatoid    Patient Active Problem List   Diagnosis Date Noted  . RA (rheumatoid arthritis) (HCC) 09/13/2015  . Rheumatoid arthritis (HCC) 09/13/2015  . Laceration of right hand with complication 08/10/2014    Past Surgical History:  Procedure Laterality Date  . TIBIA FRACTURE SURGERY Left   . TONSILLECTOMY      Prior to Admission medications   Medication Sig Start Date End Date Taking? Authorizing Provider  Cyanocobalamin (B-12) 100 MCG TABS take 1 tablet daily    [provider]  cyclobenzaprine (FLEXERIL) 10 MG tablet Take 1 tablet by mouth 3 (three) times daily. Dr Alfredo Batty 07/13/14   [provider]  doxycycline (VIBRA-TABS) 100 MG tablet Take 1 tablet (100 mg total) by mouth 2 (two) times daily. 03/01/18   Duanne Limerick, MD  Flaxseed, Linseed, (FLAX SEED OIL PO) Take  1 capsule by mouth daily.    [provider]  folic acid (FOLVITE) 1 MG tablet Take 1 mg by mouth. 01/20/13   [provider]  Golimumab 50 MG/0.5ML SOAJ Inject 50 mg into the skin. 09/17/12   [provider]  ketoconazole (NIZORAL) 2 % cream Apply 1 application topically daily. 03/01/18   Duanne Limerick, MD  methotrexate 50 MG/2ML injection Inject 1 mL into the muscle once a week. Dr Alfredo Batty 07/14/14   [provider]  mupirocin ointment (BACTROBAN) 2 % Apply 1 application topically 2 (two) times daily. 07/26/17   Duanne Limerick, MD  Naproxen Sodium (ALEVE) 220 MG CAPS Aleve 220 mg capsule  prn    [provider]  Omega-3 Fatty Acids (FISH OIL) 1000 MG CAPS Take 1 capsule daily    [provider]  traMADol (ULTRAM) 50 MG tablet Take 1 tablet (50 mg total) by mouth every 8 (eight) hours as needed. 10/27/17   Tommie Sams, DO  Vitamin D, Cholecalciferol, 400 units TABS Take 1 tablet by mouth 1 day or 1 dose.    [provider]    No Known Allergies  Family History  Problem Relation Age of Onset  . Diabetes Mother   . Cancer Father        prostate  . Cancer Paternal Grandfather        colon    Social History Social History   Tobacco Use  . Smoking status: Current Every  Day Smoker    Packs/day: 0.50    Years: 27.00    Pack years: 13.50    Types: Cigarettes  . Smokeless tobacco: Never Used  Substance Use Topics  . Alcohol use: Yes    Alcohol/week: 2.0 standard drinks    Types: 2 Cans of beer per week  . Drug use: Yes    Frequency: 7.0 times per week    Types: Marijuana    Comment: smokes once a day     Review of Systems Unable to obtain adequate/accurate review of systems mostly due to patient cooperation.  ____________________________________________   PHYSICAL EXAM:  VITAL SIGNS: ED Triage Vitals [02/25/19 1802]  Enc Vitals Group     BP (!) 121/92     Pulse Rate (!) 113     Resp 18     Temp (!) 96.8 F  (36 C)     Temp Source Axillary     SpO2 94 %     Weight 185 lb (83.9 kg)     Height 5\' 8"  (1.727 m)     Head Circumference      Peak Flow      Pain Score Asleep     Pain Loc      Pain Edu?      Excl. in [place]?    Constitutional: Patient is initially somnolent awakens to voice acts startled.  Will answer questions briefly and then he will stop talking to and then closes eyes as if asleep but then will wake up and start talking to once again.  Breathing normally.  Vitals are reassuring besides mild tachycardia. Eyes: Normal exam ENT      Head: Normocephalic and atraumatic.      Mouth/Throat: Mucous membranes are moist. Cardiovascular: Regular rhythm and rate 110 bpm. Respiratory: Normal respiratory effort without tachypnea nor retractions. Breath sounds are clear Gastrointestinal: Soft and nontender. No distention. Musculoskeletal: Nontender with normal range of motion in all extremities.  Neurologic:  Normal speech and language. No gross focal neurologic deficits, moving all extremities. Skin:  Skin is warm, dry and intact.  Psychiatric: Possibly intoxicated, possible underlying psychiatric issue.  Denies SI.  ____________________________________________   INITIAL IMPRESSION / ASSESSMENT AND PLAN / ED COURSE  Pertinent labs & imaging results that were available during my care of the patient were reviewed by me and considered in my medical decision making (see chart for details).   Patient presents emergency department for reported unresponsiveness.  Patient admitted to EMS drinking a bottle of tequila and possibly using marijuana and Klonopin.  Here the patient admits alcohol use but states "this is not about the alcohol."  States this is about "my other conditions."  But then will not talk to me or tell me about his other conditions.  We will check labs.  We will continue to closely monitor.   Patient is work-up is essentially nonrevealing besides cannabinoid positive urine  toxicology and elevated alcohol level.  Patient is requesting discharge home.  Patient's brother is here to pick the patient up.  We will discharge the patient.  He remains awake alert with no distress.  Drevon Plog was evaluated in Emergency Department on 02/25/2019 for the symptoms described in the history of present illness. He was evaluated in the context of the global COVID-19 pandemic, which necessitated consideration that the patient might be at risk for infection with the SARS-CoV-2 virus that causes COVID-19. Institutional protocols and algorithms that pertain to the evaluation of patients at risk for COVID-19  are in a state of rapid change based on information released by regulatory bodies including the CDC and federal and state organizations. These policies and algorithms were followed during the patient's care in the ED.  ____________________________________________   FINAL CLINICAL IMPRESSION(S) / ED DIAGNOSES  Intoxication   Minna Antis, MD 02/25/19 2118

## 2019-02-25 NOTE — ED Triage Notes (Signed)
Pt arrives via [facility] for ETOH. Drank a bottle of tequila. Pt is asleep during triage. Unable to assess any further. Breathing on own.

## 2019-02-25 NOTE — ED Notes (Signed)
Pt observed walking to car and getting in with S.O. Pt safe for discharge and steady on feet.

## 2019-02-26 DIAGNOSIS — F4312 Post-traumatic stress disorder, chronic: Secondary | ICD-10-CM | POA: Diagnosis not present

## 2019-02-26 DIAGNOSIS — F54 Psychological and behavioral factors associated with disorders or diseases classified elsewhere: Secondary | ICD-10-CM | POA: Diagnosis not present

## 2019-03-06 DIAGNOSIS — R748 Abnormal levels of other serum enzymes: Secondary | ICD-10-CM | POA: Diagnosis not present

## 2019-03-12 DIAGNOSIS — F4312 Post-traumatic stress disorder, chronic: Secondary | ICD-10-CM | POA: Diagnosis not present

## 2019-03-12 DIAGNOSIS — F54 Psychological and behavioral factors associated with disorders or diseases classified elsewhere: Secondary | ICD-10-CM | POA: Diagnosis not present

## 2019-03-19 DIAGNOSIS — F4312 Post-traumatic stress disorder, chronic: Secondary | ICD-10-CM | POA: Diagnosis not present

## 2019-03-19 DIAGNOSIS — F54 Psychological and behavioral factors associated with disorders or diseases classified elsewhere: Secondary | ICD-10-CM | POA: Diagnosis not present

## 2019-03-26 DIAGNOSIS — F54 Psychological and behavioral factors associated with disorders or diseases classified elsewhere: Secondary | ICD-10-CM | POA: Diagnosis not present

## 2019-03-26 DIAGNOSIS — F4312 Post-traumatic stress disorder, chronic: Secondary | ICD-10-CM | POA: Diagnosis not present

## 2019-04-02 DIAGNOSIS — F54 Psychological and behavioral factors associated with disorders or diseases classified elsewhere: Secondary | ICD-10-CM | POA: Diagnosis not present

## 2019-04-02 DIAGNOSIS — F4312 Post-traumatic stress disorder, chronic: Secondary | ICD-10-CM | POA: Diagnosis not present

## 2019-04-04 ENCOUNTER — Other Ambulatory Visit: Payer: Self-pay

## 2019-04-04 ENCOUNTER — Encounter: Payer: Self-pay | Admitting: Family Medicine

## 2019-04-04 ENCOUNTER — Ambulatory Visit (INDEPENDENT_AMBULATORY_CARE_PROVIDER_SITE_OTHER): Payer: Medicare Other | Admitting: Family Medicine

## 2019-04-04 VITALS — BP 130/70 | HR 88 | Ht 67.0 in | Wt 198.0 lb

## 2019-04-04 DIAGNOSIS — L03314 Cellulitis of groin: Secondary | ICD-10-CM

## 2019-04-04 DIAGNOSIS — B3749 Other urogenital candidiasis: Secondary | ICD-10-CM | POA: Diagnosis not present

## 2019-04-04 DIAGNOSIS — B356 Tinea cruris: Secondary | ICD-10-CM

## 2019-04-04 MED ORDER — DOXYCYCLINE HYCLATE 100 MG PO TABS: 100.0000 mg | ORAL_TABLET | Freq: Two times a day (BID) | ORAL | 0 refills | Status: DC

## 2019-04-04 MED ORDER — KETOCONAZOLE 2 % EX CREA: 1.0000 "application " | TOPICAL_CREAM | Freq: Every day | CUTANEOUS | 1 refills | Status: DC

## 2019-04-04 MED ORDER — MUPIROCIN 2 % EX OINT: 1.0000 "application " | TOPICAL_OINTMENT | Freq: Two times a day (BID) | CUTANEOUS | 0 refills | Status: DC

## 2019-04-04 NOTE — Progress Notes (Signed)
Date:  04/04/2019   Name:  Patrick Perez   DOB:  1965/10/16   MRN:  [id number]   Chief Complaint: Rash (on sides of groin- was treated with doxy and bactroban Jan of last year)  Rash This is a new problem. The current episode started in the past 7 days (8-10 days). The problem has been gradually improving since onset. The affected locations include the scalp and groin. The rash is characterized by redness and itchiness. Pertinent negatives include no anorexia, congestion, cough, diarrhea, eye pain, facial edema, fatigue, fever, joint pain, nail changes, rhinorrhea, shortness of breath, sore throat or vomiting. Past treatments include nothing (past/nizoral /bactroba/doxy).    Lab Results  Component Value Date   CREATININE 0.82 02/25/2019   BUN 6 02/25/2019   NA 136 02/25/2019   K 3.4 (L) 02/25/2019   CL 101 02/25/2019   CO2 23 02/25/2019   Lab Results  Component Value Date   CHOL 183 10/18/2016   HDL 45 10/18/2016   LDLCALC 123 (H) 10/18/2016   TRIG 75 10/18/2016   CHOLHDL 4.1 10/18/2016   Lab Results  Component Value Date   TSH 0.80 10/29/2011   No results found for: HGBA1C   Review of Systems  Constitutional: Negative for chills, fatigue and fever.  HENT: Negative for congestion, drooling, ear discharge, ear pain, rhinorrhea and sore throat.   Eyes: Negative for pain.  Respiratory: Negative for cough, shortness of breath and wheezing.   Cardiovascular: Negative for chest pain, palpitations and leg swelling.  Gastrointestinal: Negative for abdominal pain, anorexia, blood in stool, constipation, diarrhea, nausea and vomiting.  Endocrine: Negative for polydipsia.  Genitourinary: Negative for dysuria, frequency, hematuria and urgency.  Musculoskeletal: Negative for back pain, joint pain, myalgias and neck pain.  Skin: Positive for rash. Negative for nail changes.  Allergic/Immunologic: Negative for environmental allergies.  Neurological: Negative for dizziness and  headaches.  Hematological: Does not bruise/bleed easily.  Psychiatric/Behavioral: Negative for suicidal ideas. The patient is not nervous/anxious.     Patient Active Problem List   Diagnosis Date Noted  . RA (rheumatoid arthritis) (HCC) 09/13/2015  . Rheumatoid arthritis (HCC) 09/13/2015  . Laceration of right hand with complication 08/10/2014    No Known Allergies  Past Surgical History:  Procedure Laterality Date  . TIBIA FRACTURE SURGERY Left   . TONSILLECTOMY      Social History   Tobacco Use  . Smoking status: Current Every Day Smoker    Packs/day: 0.50    Years: 27.00    Pack years: 13.50    Types: Cigarettes  . Smokeless tobacco: Never Used  Substance Use Topics  . Alcohol use: Yes    Alcohol/week: 2.0 standard drinks    Types: 2 Cans of beer per week  . Drug use: Yes    Frequency: 7.0 times per week    Types: Marijuana    Comment: smokes once a day      Medication list has been reviewed and updated.  Current Meds  Medication Sig  . Cyanocobalamin (B-12) 100 MCG TABS take 1 tablet daily  . cyclobenzaprine (FLEXERIL) 10 MG tablet Take 1 tablet by mouth 3 (three) times daily. Dr Alfredo Batty  . folic acid (FOLVITE) 1 MG tablet Take 1 mg by mouth.  . gabapentin (NEURONTIN) 100 MG capsule Dr Alfredo Batty  . methotrexate 50 MG/2ML injection Inject 1 mL into the muscle once a week. Dr Alfredo Batty  . Naproxen Sodium (ALEVE) 220 MG CAPS Aleve 220 mg capsule  prn  .  Omega-3 Fatty Acids (FISH OIL) 1000 MG CAPS Take 1 capsule daily  . Tofacitinib Citrate ER (XELJANZ XR) 11 MG TB24 Dr Alfredo Batty  . traMADol (ULTRAM) 50 MG tablet Take 1 tablet (50 mg total) by mouth every 8 (eight) hours as needed.  . Vitamin D, Cholecalciferol, 400 units TABS Take 1 tablet by mouth 1 day or 1 dose.    PHQ 2/9 Scores 04/04/2019 03/01/2018 08/29/2017 08/28/2016  PHQ - 2 Score 3 0 4 0  PHQ- 9 Score 5 0 10 -    BP Readings from Last 3 Encounters:  04/04/19 130/70  02/25/19 (!) 111/92  03/01/18  130/80    Physical Exam Vitals and nursing note reviewed.  HENT:     Head: Normocephalic.     Right Ear: Tympanic membrane, ear canal and external ear normal.     Left Ear: Tympanic membrane, ear canal and external ear normal.     Nose: Nose normal.  Eyes:     General: No scleral icterus.       Right eye: No discharge.        Left eye: No discharge.     Conjunctiva/sclera: Conjunctivae normal.     Pupils: Pupils are equal, round, and reactive to light.  Neck:     Thyroid: No thyromegaly.     Vascular: No JVD.     Trachea: No tracheal deviation.  Cardiovascular:     Rate and Rhythm: Normal rate and regular rhythm.     Heart sounds: Normal heart sounds. No murmur. No friction rub. No gallop.   Pulmonary:     Effort: No respiratory distress.     Breath sounds: Normal breath sounds. No wheezing or rales.  Abdominal:     General: Bowel sounds are normal.     Palpations: Abdomen is soft. There is no mass.     Tenderness: There is no abdominal tenderness. There is no guarding or rebound.  Musculoskeletal:        General: No tenderness. Normal range of motion.     Cervical back: Normal range of motion and neck supple.  Lymphadenopathy:     Cervical: No cervical adenopathy.  Skin:    General: Skin is warm.     Findings: No rash.  Neurological:     Mental Status: He is alert and oriented to person, place, and time.     Cranial Nerves: No cranial nerve deficit.     Deep Tendon Reflexes: Reflexes are normal and symmetric.     Wt Readings from Last 3 Encounters:  04/04/19 198 lb (89.8 kg)  02/25/19 185 lb (83.9 kg)  03/01/18 202 lb (91.6 kg)    BP 130/70   Pulse 88   Ht 5\' 7"  (1.702 m)   Wt 198 lb (89.8 kg)   BMI 31.01 kg/m   Assessment and Plan:  1. Genital candidiasis in male Episodic.  Acute.  Stable.  As previously patient has used the Nizoral cream along with Bactroban for inguinal area tinea with secondary bacterial infection. - ketoconazole (NIZORAL) 2 % cream;  Apply 1 application topically daily.  Dispense: 30 g; Refill: 1  2. Cellulitis of groin There is a mild cellulitis noted of the left inguinal area.  We will treat with Bactroban ointment as well as doxycycline 1 tablet twice a day for 10 days. - mupirocin ointment (BACTROBAN) 2 %; Apply 1 application topically 2 (two) times daily.  Dispense: 22 g; Refill: 0 - doxycycline (VIBRA-TABS) 100 MG tablet; Take 1 tablet (  100 mg total) by mouth 2 (two) times daily.  Dispense: 20 tablet; Refill: 0  3. Tinea cruris As noted above secondarily infected tinea cruris we will continue Nizoral cream in this area to achieve clearance.  Patient has refills to use as needed.  - ketoconazole (NIZORAL) 2 % cream; Apply 1 application topically daily.  Dispense: 30 g; Refill: 1

## 2019-04-09 DIAGNOSIS — F4312 Post-traumatic stress disorder, chronic: Secondary | ICD-10-CM | POA: Diagnosis not present

## 2019-04-09 DIAGNOSIS — F54 Psychological and behavioral factors associated with disorders or diseases classified elsewhere: Secondary | ICD-10-CM | POA: Diagnosis not present

## 2019-04-16 DIAGNOSIS — F54 Psychological and behavioral factors associated with disorders or diseases classified elsewhere: Secondary | ICD-10-CM | POA: Diagnosis not present

## 2019-04-16 DIAGNOSIS — F4312 Post-traumatic stress disorder, chronic: Secondary | ICD-10-CM | POA: Diagnosis not present

## 2019-04-23 DIAGNOSIS — F54 Psychological and behavioral factors associated with disorders or diseases classified elsewhere: Secondary | ICD-10-CM | POA: Diagnosis not present

## 2019-04-23 DIAGNOSIS — F4312 Post-traumatic stress disorder, chronic: Secondary | ICD-10-CM | POA: Diagnosis not present

## 2019-04-30 DIAGNOSIS — F54 Psychological and behavioral factors associated with disorders or diseases classified elsewhere: Secondary | ICD-10-CM | POA: Diagnosis not present

## 2019-04-30 DIAGNOSIS — F4312 Post-traumatic stress disorder, chronic: Secondary | ICD-10-CM | POA: Diagnosis not present

## 2019-05-07 DIAGNOSIS — F4312 Post-traumatic stress disorder, chronic: Secondary | ICD-10-CM | POA: Diagnosis not present

## 2019-05-07 DIAGNOSIS — F54 Psychological and behavioral factors associated with disorders or diseases classified elsewhere: Secondary | ICD-10-CM | POA: Diagnosis not present

## 2019-05-14 DIAGNOSIS — F4312 Post-traumatic stress disorder, chronic: Secondary | ICD-10-CM | POA: Diagnosis not present

## 2019-05-14 DIAGNOSIS — F54 Psychological and behavioral factors associated with disorders or diseases classified elsewhere: Secondary | ICD-10-CM | POA: Diagnosis not present

## 2019-05-19 DIAGNOSIS — Z23 Encounter for immunization: Secondary | ICD-10-CM | POA: Diagnosis not present

## 2019-05-21 DIAGNOSIS — F4312 Post-traumatic stress disorder, chronic: Secondary | ICD-10-CM | POA: Diagnosis not present

## 2019-05-21 DIAGNOSIS — F54 Psychological and behavioral factors associated with disorders or diseases classified elsewhere: Secondary | ICD-10-CM | POA: Diagnosis not present

## 2019-05-27 DIAGNOSIS — M0579 Rheumatoid arthritis with rheumatoid factor of multiple sites without organ or systems involvement: Secondary | ICD-10-CM | POA: Diagnosis not present

## 2019-05-27 DIAGNOSIS — M150 Primary generalized (osteo)arthritis: Secondary | ICD-10-CM | POA: Diagnosis not present

## 2019-05-28 DIAGNOSIS — F54 Psychological and behavioral factors associated with disorders or diseases classified elsewhere: Secondary | ICD-10-CM | POA: Diagnosis not present

## 2019-05-28 DIAGNOSIS — F4312 Post-traumatic stress disorder, chronic: Secondary | ICD-10-CM | POA: Diagnosis not present

## 2019-06-11 DIAGNOSIS — F4312 Post-traumatic stress disorder, chronic: Secondary | ICD-10-CM | POA: Diagnosis not present

## 2019-06-11 DIAGNOSIS — F54 Psychological and behavioral factors associated with disorders or diseases classified elsewhere: Secondary | ICD-10-CM | POA: Diagnosis not present

## 2019-06-16 DIAGNOSIS — Z23 Encounter for immunization: Secondary | ICD-10-CM | POA: Diagnosis not present

## 2019-07-02 DIAGNOSIS — F4312 Post-traumatic stress disorder, chronic: Secondary | ICD-10-CM | POA: Diagnosis not present

## 2019-07-02 DIAGNOSIS — F54 Psychological and behavioral factors associated with disorders or diseases classified elsewhere: Secondary | ICD-10-CM | POA: Diagnosis not present

## 2019-07-04 ENCOUNTER — Other Ambulatory Visit: Payer: Self-pay | Admitting: Family Medicine

## 2019-07-04 DIAGNOSIS — B3749 Other urogenital candidiasis: Secondary | ICD-10-CM

## 2019-07-04 DIAGNOSIS — B356 Tinea cruris: Secondary | ICD-10-CM

## 2019-07-09 DIAGNOSIS — F54 Psychological and behavioral factors associated with disorders or diseases classified elsewhere: Secondary | ICD-10-CM | POA: Diagnosis not present

## 2019-07-09 DIAGNOSIS — F4312 Post-traumatic stress disorder, chronic: Secondary | ICD-10-CM | POA: Diagnosis not present

## 2019-07-16 DIAGNOSIS — F54 Psychological and behavioral factors associated with disorders or diseases classified elsewhere: Secondary | ICD-10-CM | POA: Diagnosis not present

## 2019-07-16 DIAGNOSIS — F4312 Post-traumatic stress disorder, chronic: Secondary | ICD-10-CM | POA: Diagnosis not present

## 2019-07-23 DIAGNOSIS — F54 Psychological and behavioral factors associated with disorders or diseases classified elsewhere: Secondary | ICD-10-CM | POA: Diagnosis not present

## 2019-07-23 DIAGNOSIS — F4312 Post-traumatic stress disorder, chronic: Secondary | ICD-10-CM | POA: Diagnosis not present

## 2019-07-30 DIAGNOSIS — F4312 Post-traumatic stress disorder, chronic: Secondary | ICD-10-CM | POA: Diagnosis not present

## 2019-07-30 DIAGNOSIS — F54 Psychological and behavioral factors associated with disorders or diseases classified elsewhere: Secondary | ICD-10-CM | POA: Diagnosis not present

## 2019-08-06 DIAGNOSIS — F4312 Post-traumatic stress disorder, chronic: Secondary | ICD-10-CM | POA: Diagnosis not present

## 2019-08-06 DIAGNOSIS — F54 Psychological and behavioral factors associated with disorders or diseases classified elsewhere: Secondary | ICD-10-CM | POA: Diagnosis not present

## 2019-08-13 DIAGNOSIS — F54 Psychological and behavioral factors associated with disorders or diseases classified elsewhere: Secondary | ICD-10-CM | POA: Diagnosis not present

## 2019-08-13 DIAGNOSIS — F4312 Post-traumatic stress disorder, chronic: Secondary | ICD-10-CM | POA: Diagnosis not present

## 2019-08-20 DIAGNOSIS — F54 Psychological and behavioral factors associated with disorders or diseases classified elsewhere: Secondary | ICD-10-CM | POA: Diagnosis not present

## 2019-08-20 DIAGNOSIS — F4312 Post-traumatic stress disorder, chronic: Secondary | ICD-10-CM | POA: Diagnosis not present

## 2019-09-03 DIAGNOSIS — F54 Psychological and behavioral factors associated with disorders or diseases classified elsewhere: Secondary | ICD-10-CM | POA: Diagnosis not present

## 2019-09-03 DIAGNOSIS — F4312 Post-traumatic stress disorder, chronic: Secondary | ICD-10-CM | POA: Diagnosis not present

## 2019-09-10 DIAGNOSIS — F54 Psychological and behavioral factors associated with disorders or diseases classified elsewhere: Secondary | ICD-10-CM | POA: Diagnosis not present

## 2019-09-10 DIAGNOSIS — F4312 Post-traumatic stress disorder, chronic: Secondary | ICD-10-CM | POA: Diagnosis not present

## 2019-09-11 DIAGNOSIS — M150 Primary generalized (osteo)arthritis: Secondary | ICD-10-CM | POA: Diagnosis not present

## 2019-09-11 DIAGNOSIS — M0579 Rheumatoid arthritis with rheumatoid factor of multiple sites without organ or systems involvement: Secondary | ICD-10-CM | POA: Diagnosis not present

## 2019-09-17 DIAGNOSIS — F4312 Post-traumatic stress disorder, chronic: Secondary | ICD-10-CM | POA: Diagnosis not present

## 2019-09-17 DIAGNOSIS — F54 Psychological and behavioral factors associated with disorders or diseases classified elsewhere: Secondary | ICD-10-CM | POA: Diagnosis not present

## 2019-09-24 DIAGNOSIS — F54 Psychological and behavioral factors associated with disorders or diseases classified elsewhere: Secondary | ICD-10-CM | POA: Diagnosis not present

## 2019-09-24 DIAGNOSIS — F4312 Post-traumatic stress disorder, chronic: Secondary | ICD-10-CM | POA: Diagnosis not present

## 2019-10-01 DIAGNOSIS — F54 Psychological and behavioral factors associated with disorders or diseases classified elsewhere: Secondary | ICD-10-CM | POA: Diagnosis not present

## 2019-10-01 DIAGNOSIS — F4312 Post-traumatic stress disorder, chronic: Secondary | ICD-10-CM | POA: Diagnosis not present

## 2019-10-08 DIAGNOSIS — F4312 Post-traumatic stress disorder, chronic: Secondary | ICD-10-CM | POA: Diagnosis not present

## 2019-10-08 DIAGNOSIS — F54 Psychological and behavioral factors associated with disorders or diseases classified elsewhere: Secondary | ICD-10-CM | POA: Diagnosis not present

## 2019-10-15 DIAGNOSIS — F4312 Post-traumatic stress disorder, chronic: Secondary | ICD-10-CM | POA: Diagnosis not present

## 2019-10-15 DIAGNOSIS — F54 Psychological and behavioral factors associated with disorders or diseases classified elsewhere: Secondary | ICD-10-CM | POA: Diagnosis not present

## 2019-10-22 ENCOUNTER — Other Ambulatory Visit: Payer: Self-pay

## 2019-10-22 ENCOUNTER — Ambulatory Visit (INDEPENDENT_AMBULATORY_CARE_PROVIDER_SITE_OTHER): Payer: Medicare Other | Admitting: Family Medicine

## 2019-10-22 ENCOUNTER — Encounter: Payer: Self-pay | Admitting: Family Medicine

## 2019-10-22 VITALS — BP 128/80 | HR 100 | Ht 67.0 in | Wt 204.0 lb

## 2019-10-22 DIAGNOSIS — L03211 Cellulitis of face: Secondary | ICD-10-CM | POA: Diagnosis not present

## 2019-10-22 DIAGNOSIS — F4312 Post-traumatic stress disorder, chronic: Secondary | ICD-10-CM | POA: Diagnosis not present

## 2019-10-22 DIAGNOSIS — F54 Psychological and behavioral factors associated with disorders or diseases classified elsewhere: Secondary | ICD-10-CM | POA: Diagnosis not present

## 2019-10-22 DIAGNOSIS — Z23 Encounter for immunization: Secondary | ICD-10-CM | POA: Diagnosis not present

## 2019-10-22 DIAGNOSIS — L0201 Cutaneous abscess of face: Secondary | ICD-10-CM | POA: Diagnosis not present

## 2019-10-22 DIAGNOSIS — L739 Follicular disorder, unspecified: Secondary | ICD-10-CM

## 2019-10-22 MED ORDER — MUPIROCIN 2 % EX OINT: 1.0000 "application " | TOPICAL_OINTMENT | Freq: Two times a day (BID) | CUTANEOUS | 0 refills | Status: DC

## 2019-10-22 MED ORDER — DOXYCYCLINE HYCLATE 100 MG PO TABS: 100.0000 mg | ORAL_TABLET | Freq: Two times a day (BID) | ORAL | 0 refills | Status: DC

## 2019-10-22 NOTE — Progress Notes (Signed)
Date:  10/22/2019   Name:  Patrick Perez   DOB:  Dec 29, 1965   MRN:  [id number]   Chief Complaint: Facial Swelling (got up last night and noticed facial swelling with a black dot in the center. ) and Flu Vaccine  Patient is a 54 year old male who presents for a facial abscess. . The patient reports the following problems: facial swelling/pain last night. Health maintenance has been reviewed influenza.   Lab Results  Component Value Date   CREATININE 0.82 02/25/2019   BUN 6 02/25/2019   NA 136 02/25/2019   K 3.4 (L) 02/25/2019   CL 101 02/25/2019   CO2 23 02/25/2019   Lab Results  Component Value Date   CHOL 183 10/18/2016   HDL 45 10/18/2016   LDLCALC 123 (H) 10/18/2016   TRIG 75 10/18/2016   CHOLHDL 4.1 10/18/2016   Lab Results  Component Value Date   TSH 0.80 10/29/2011   No results found for: HGBA1C Lab Results  Component Value Date   WBC 8.8 02/25/2019   HGB 13.9 02/25/2019   HCT 39.2 02/25/2019   MCV 96.1 02/25/2019   PLT 165 02/25/2019   Lab Results  Component Value Date   ALT 46 (H) 02/25/2019   AST 105 (H) 02/25/2019   ALKPHOS 221 (H) 02/25/2019   BILITOT 2.2 (H) 02/25/2019     Review of Systems  Patient Active Problem List   Diagnosis Date Noted   RA (rheumatoid arthritis) (HCC) 09/13/2015   Rheumatoid arthritis (HCC) 09/13/2015   Laceration of right hand with complication 08/10/2014    No Known Allergies  Past Surgical History:  Procedure Laterality Date   TIBIA FRACTURE SURGERY Left    TONSILLECTOMY      Social History   Tobacco Use   Smoking status: Current Every Day Smoker    Packs/day: 0.50    Years: 27.00    Pack years: 13.50    Types: Cigarettes   Smokeless tobacco: Never Used  Vaping Use   Vaping Use: Never used  Substance Use Topics   Alcohol use: Yes    Alcohol/week: 2.0 standard drinks    Types: 2 Cans of beer per week   Drug use: Yes    Frequency: 7.0 times per week    Types: Marijuana    Comment:  smokes once a day      Medication list has been reviewed and updated.  Current Meds  Medication Sig   cyclobenzaprine (FLEXERIL) 10 MG tablet Take 1 tablet by mouth 3 (three) times daily. Dr Alfredo Batty   folic acid (FOLVITE) 1 MG tablet Take 1 mg by mouth.   gabapentin (NEURONTIN) 100 MG capsule Dr Alfredo Batty   methotrexate 50 MG/2ML injection Inject 1 mL into the muscle once a week. Dr Alfredo Batty   Naproxen Sodium (ALEVE) 220 MG CAPS Aleve 220 mg capsule  prn   Omega-3 Fatty Acids (FISH OIL) 1000 MG CAPS Take 1 capsule daily   Tofacitinib Citrate ER (XELJANZ XR) 11 MG TB24 Dr Alfredo Batty   traMADol (ULTRAM) 50 MG tablet Take 1 tablet (50 mg total) by mouth every 8 (eight) hours as needed.   Vitamin D, Cholecalciferol, 400 units TABS Take 1 tablet by mouth 1 day or 1 dose.   [DISCONTINUED] Cyanocobalamin (B-12) 100 MCG TABS take 1 tablet daily    PHQ 2/9 Scores 10/22/2019 04/04/2019 03/01/2018 08/29/2017  PHQ - 2 Score 0 3 0 4  PHQ- 9 Score 0 5 0 10  GAD 7 : Generalized Anxiety Score 10/22/2019 04/04/2019  Nervous, Anxious, on Edge 1 2  Control/stop worrying 0 0  Worry too much - different things 0 0  Trouble relaxing 1 0  Restless 1 0  Easily annoyed or irritable 1 0  Afraid - awful might happen 0 0  Total GAD 7 Score 4 2  Anxiety Difficulty Somewhat difficult Not difficult at all    BP Readings from Last 3 Encounters:  10/22/19 128/80  04/04/19 130/70  02/25/19 (!) 111/92    Physical Exam  Wt Readings from Last 3 Encounters:  10/22/19 204 lb (92.5 kg)  04/04/19 198 lb (89.8 kg)  02/25/19 185 lb (83.9 kg)    BP 128/80    Pulse 100    Ht 5\' 7"  (1.702 m)    Wt 204 lb (92.5 kg)    BMI 31.95 kg/m   Assessment and Plan: 1. Facial abscess New onset.  Uncontrolled.  Patient went camping over the past week.  Patient started having issues with cheek pain which culminated to an increased this morning.  Area is firm tender of the left cheek with a central denuded area.  On the  inside looks to be a communicating tract to the oral side with possible purulent material noted.  We will apply Bactroban to the outer purpose and take doxycycline 100 mg twice a day.  We have also inquired about dermatology seen in case I&D is necessary. - mupirocin ointment (BACTROBAN) 2 %; Apply 1 application topically 2 (two) times daily.  Dispense: 22 g; Refill: 0 - doxycycline (VIBRA-TABS) 100 MG tablet; Take 1 tablet (100 mg total) by mouth 2 (two) times daily.  Dispense: 20 tablet; Refill: 0  2. Folliculitis Chronic.  Uncontrolled.  Persistent.  We will try to get it under control with initial doxycycline but may need to have a daily regimen both topical and oral for control in the future.  Referral to dermatology has been made by phone and patient is to be contacted by Dr. office. - doxycycline (VIBRA-TABS) 100 MG tablet; Take 1 tablet (100 mg total) by mouth 2 (two) times daily.  Dispense: 20 tablet; Refill: 0  3. Need for immunization against influenza Discussed and administered - Flu Vaccine QUAD 36+ mos IM

## 2019-10-29 DIAGNOSIS — F54 Psychological and behavioral factors associated with disorders or diseases classified elsewhere: Secondary | ICD-10-CM | POA: Diagnosis not present

## 2019-10-29 DIAGNOSIS — F4312 Post-traumatic stress disorder, chronic: Secondary | ICD-10-CM | POA: Diagnosis not present

## 2019-11-12 DIAGNOSIS — F4312 Post-traumatic stress disorder, chronic: Secondary | ICD-10-CM | POA: Diagnosis not present

## 2019-11-12 DIAGNOSIS — F54 Psychological and behavioral factors associated with disorders or diseases classified elsewhere: Secondary | ICD-10-CM | POA: Diagnosis not present

## 2019-11-19 DIAGNOSIS — F4312 Post-traumatic stress disorder, chronic: Secondary | ICD-10-CM | POA: Diagnosis not present

## 2019-11-19 DIAGNOSIS — F54 Psychological and behavioral factors associated with disorders or diseases classified elsewhere: Secondary | ICD-10-CM | POA: Diagnosis not present

## 2019-11-26 DIAGNOSIS — F4312 Post-traumatic stress disorder, chronic: Secondary | ICD-10-CM | POA: Diagnosis not present

## 2019-11-26 DIAGNOSIS — F54 Psychological and behavioral factors associated with disorders or diseases classified elsewhere: Secondary | ICD-10-CM | POA: Diagnosis not present

## 2019-12-10 DIAGNOSIS — F54 Psychological and behavioral factors associated with disorders or diseases classified elsewhere: Secondary | ICD-10-CM | POA: Diagnosis not present

## 2019-12-10 DIAGNOSIS — F4312 Post-traumatic stress disorder, chronic: Secondary | ICD-10-CM | POA: Diagnosis not present

## 2019-12-16 DIAGNOSIS — M0579 Rheumatoid arthritis with rheumatoid factor of multiple sites without organ or systems involvement: Secondary | ICD-10-CM | POA: Diagnosis not present

## 2019-12-16 DIAGNOSIS — M150 Primary generalized (osteo)arthritis: Secondary | ICD-10-CM | POA: Diagnosis not present

## 2019-12-17 DIAGNOSIS — F4312 Post-traumatic stress disorder, chronic: Secondary | ICD-10-CM | POA: Diagnosis not present

## 2019-12-17 DIAGNOSIS — F54 Psychological and behavioral factors associated with disorders or diseases classified elsewhere: Secondary | ICD-10-CM | POA: Diagnosis not present

## 2019-12-18 DIAGNOSIS — Z23 Encounter for immunization: Secondary | ICD-10-CM | POA: Diagnosis not present

## 2019-12-24 DIAGNOSIS — F4312 Post-traumatic stress disorder, chronic: Secondary | ICD-10-CM | POA: Diagnosis not present

## 2019-12-24 DIAGNOSIS — F54 Psychological and behavioral factors associated with disorders or diseases classified elsewhere: Secondary | ICD-10-CM | POA: Diagnosis not present

## 2019-12-31 DIAGNOSIS — F54 Psychological and behavioral factors associated with disorders or diseases classified elsewhere: Secondary | ICD-10-CM | POA: Diagnosis not present

## 2019-12-31 DIAGNOSIS — F4312 Post-traumatic stress disorder, chronic: Secondary | ICD-10-CM | POA: Diagnosis not present

## 2020-01-14 DIAGNOSIS — F4312 Post-traumatic stress disorder, chronic: Secondary | ICD-10-CM | POA: Diagnosis not present

## 2020-01-14 DIAGNOSIS — F54 Psychological and behavioral factors associated with disorders or diseases classified elsewhere: Secondary | ICD-10-CM | POA: Diagnosis not present

## 2020-01-21 DIAGNOSIS — F54 Psychological and behavioral factors associated with disorders or diseases classified elsewhere: Secondary | ICD-10-CM | POA: Diagnosis not present

## 2020-01-21 DIAGNOSIS — F4312 Post-traumatic stress disorder, chronic: Secondary | ICD-10-CM | POA: Diagnosis not present

## 2020-01-28 DIAGNOSIS — F4312 Post-traumatic stress disorder, chronic: Secondary | ICD-10-CM | POA: Diagnosis not present

## 2020-01-28 DIAGNOSIS — F54 Psychological and behavioral factors associated with disorders or diseases classified elsewhere: Secondary | ICD-10-CM | POA: Diagnosis not present

## 2020-02-04 DIAGNOSIS — F54 Psychological and behavioral factors associated with disorders or diseases classified elsewhere: Secondary | ICD-10-CM | POA: Diagnosis not present

## 2020-02-04 DIAGNOSIS — F4312 Post-traumatic stress disorder, chronic: Secondary | ICD-10-CM | POA: Diagnosis not present

## 2020-02-18 DIAGNOSIS — F54 Psychological and behavioral factors associated with disorders or diseases classified elsewhere: Secondary | ICD-10-CM | POA: Diagnosis not present

## 2020-02-18 DIAGNOSIS — F4312 Post-traumatic stress disorder, chronic: Secondary | ICD-10-CM | POA: Diagnosis not present

## 2020-03-03 DIAGNOSIS — F54 Psychological and behavioral factors associated with disorders or diseases classified elsewhere: Secondary | ICD-10-CM | POA: Diagnosis not present

## 2020-03-03 DIAGNOSIS — F4312 Post-traumatic stress disorder, chronic: Secondary | ICD-10-CM | POA: Diagnosis not present

## 2020-03-10 DIAGNOSIS — F4312 Post-traumatic stress disorder, chronic: Secondary | ICD-10-CM | POA: Diagnosis not present

## 2020-03-10 DIAGNOSIS — F54 Psychological and behavioral factors associated with disorders or diseases classified elsewhere: Secondary | ICD-10-CM | POA: Diagnosis not present

## 2020-03-24 DIAGNOSIS — F4312 Post-traumatic stress disorder, chronic: Secondary | ICD-10-CM | POA: Diagnosis not present

## 2020-03-24 DIAGNOSIS — F54 Psychological and behavioral factors associated with disorders or diseases classified elsewhere: Secondary | ICD-10-CM | POA: Diagnosis not present

## 2020-03-31 DIAGNOSIS — F4312 Post-traumatic stress disorder, chronic: Secondary | ICD-10-CM | POA: Diagnosis not present

## 2020-03-31 DIAGNOSIS — F54 Psychological and behavioral factors associated with disorders or diseases classified elsewhere: Secondary | ICD-10-CM | POA: Diagnosis not present

## 2020-04-07 DIAGNOSIS — F54 Psychological and behavioral factors associated with disorders or diseases classified elsewhere: Secondary | ICD-10-CM | POA: Diagnosis not present

## 2020-04-07 DIAGNOSIS — F4312 Post-traumatic stress disorder, chronic: Secondary | ICD-10-CM | POA: Diagnosis not present

## 2020-04-14 DIAGNOSIS — F4312 Post-traumatic stress disorder, chronic: Secondary | ICD-10-CM | POA: Diagnosis not present

## 2020-04-14 DIAGNOSIS — F54 Psychological and behavioral factors associated with disorders or diseases classified elsewhere: Secondary | ICD-10-CM | POA: Diagnosis not present

## 2020-04-21 DIAGNOSIS — F54 Psychological and behavioral factors associated with disorders or diseases classified elsewhere: Secondary | ICD-10-CM | POA: Diagnosis not present

## 2020-04-21 DIAGNOSIS — F4312 Post-traumatic stress disorder, chronic: Secondary | ICD-10-CM | POA: Diagnosis not present

## 2020-04-28 DIAGNOSIS — F4312 Post-traumatic stress disorder, chronic: Secondary | ICD-10-CM | POA: Diagnosis not present

## 2020-04-28 DIAGNOSIS — F54 Psychological and behavioral factors associated with disorders or diseases classified elsewhere: Secondary | ICD-10-CM | POA: Diagnosis not present

## 2020-04-29 ENCOUNTER — Ambulatory Visit
Admit: 2020-04-29 | Discharge: 2020-05-01 | Disposition: A | Discharge status: Other Acute Inpatient Hospital | Payer: MEDICARE

## 2020-04-29 DIAGNOSIS — Z20822 Contact with and (suspected) exposure to covid-19: Secondary | ICD-10-CM | POA: Diagnosis not present

## 2020-04-29 DIAGNOSIS — R61 Generalized hyperhidrosis: Secondary | ICD-10-CM | POA: Diagnosis not present

## 2020-04-29 DIAGNOSIS — R000 Tachycardia, unspecified: Secondary | ICD-10-CM | POA: Diagnosis not present

## 2020-05-02 DIAGNOSIS — F4310 Post-traumatic stress disorder, unspecified: Secondary | ICD-10-CM | POA: Diagnosis not present

## 2020-05-05 DIAGNOSIS — F54 Psychological and behavioral factors associated with disorders or diseases classified elsewhere: Secondary | ICD-10-CM | POA: Diagnosis not present

## 2020-05-05 DIAGNOSIS — F4312 Post-traumatic stress disorder, chronic: Secondary | ICD-10-CM | POA: Diagnosis not present

## 2020-05-09 DIAGNOSIS — F4310 Post-traumatic stress disorder, unspecified: Secondary | ICD-10-CM | POA: Diagnosis not present

## 2020-05-11 ENCOUNTER — Other Ambulatory Visit: Payer: Self-pay

## 2020-05-11 ENCOUNTER — Telehealth: Payer: Self-pay

## 2020-05-11 MED ORDER — GABAPENTIN 300 MG PO CAPS: 300.0000 mg | ORAL_CAPSULE | Freq: Every day | ORAL | 0 refills | Status: DC

## 2020-05-11 MED ORDER — QUETIAPINE FUMARATE 50 MG PO TABS: 50.0000 mg | ORAL_TABLET | Freq: Three times a day (TID) | ORAL | 0 refills | Status: DC

## 2020-05-11 MED ORDER — HYDROXYZINE PAMOATE 25 MG PO CAPS: 25.0000 mg | ORAL_CAPSULE | Freq: Four times a day (QID) | ORAL | 0 refills | Status: DC | PRN

## 2020-05-11 NOTE — Telephone Encounter (Signed)
I don't see anything unless y'all have received it. I looked in care everywhere and they are not listed either

## 2020-05-11 NOTE — Telephone Encounter (Unsigned)
Copied from CRM [id number]. Topic: General - Other >> May 10, 2020  4:57 PM Gwenlyn Fudge wrote: Reason for CRM: Pt calling stating that [facility] was supposed to send over paperwork for pt and pt is requesting to verify if this has been received. Please advise.

## 2020-05-11 NOTE — Telephone Encounter (Signed)
Fax just came in, its in your box

## 2020-05-13 ENCOUNTER — Encounter: Payer: Self-pay | Admitting: Family Medicine

## 2020-05-13 ENCOUNTER — Ambulatory Visit (INDEPENDENT_AMBULATORY_CARE_PROVIDER_SITE_OTHER): Payer: Medicare Other | Admitting: Family Medicine

## 2020-05-13 ENCOUNTER — Other Ambulatory Visit: Payer: Self-pay

## 2020-05-13 VITALS — BP 130/88 | HR 80 | Ht 67.0 in | Wt 201.0 lb

## 2020-05-13 DIAGNOSIS — F17211 Nicotine dependence, cigarettes, in remission: Secondary | ICD-10-CM

## 2020-05-13 DIAGNOSIS — F419 Anxiety disorder, unspecified: Secondary | ICD-10-CM | POA: Diagnosis not present

## 2020-05-13 DIAGNOSIS — F1020 Alcohol dependence, uncomplicated: Secondary | ICD-10-CM

## 2020-05-13 DIAGNOSIS — F4310 Post-traumatic stress disorder, unspecified: Secondary | ICD-10-CM | POA: Diagnosis not present

## 2020-05-13 MED ORDER — QUETIAPINE FUMARATE 50 MG PO TABS: 50.0000 mg | ORAL_TABLET | Freq: Three times a day (TID) | ORAL | 1 refills | Status: DC

## 2020-05-13 MED ORDER — HYDROXYZINE PAMOATE 25 MG PO CAPS: 25.0000 mg | ORAL_CAPSULE | Freq: Three times a day (TID) | ORAL | 1 refills | Status: DC

## 2020-05-13 MED ORDER — QUETIAPINE FUMARATE 50 MG PO TABS: 50.0000 mg | ORAL_TABLET | Freq: Three times a day (TID) | ORAL | 0 refills | Status: DC

## 2020-05-13 MED ORDER — HYDROXYZINE PAMOATE 25 MG PO CAPS: 25.0000 mg | ORAL_CAPSULE | Freq: Four times a day (QID) | ORAL | 0 refills | Status: DC | PRN

## 2020-05-13 NOTE — Progress Notes (Signed)
Date:  05/13/2020   Name:  Patrick Perez   DOB:  Jun 20, 1965   MRN:  [id number]   Chief Complaint: Follow-up (Follow up from hospital stay)  Palpitations  This is a new problem. The current episode started 1 to 4 weeks ago. The problem has been resolved. The symptoms are aggravated by stress. Associated symptoms include anxiety, chest fullness and chest pain. Pertinent negatives include no coughing, diaphoresis, dizziness, fever, irregular heartbeat, malaise/fatigue, nausea, near-syncope, numbness, shortness of breath, syncope, vomiting or weakness. He has tried nothing for the symptoms. The treatment provided moderate relief.  Anxiety Presents for follow-up visit. Symptoms include chest pain, depressed mood, nervous/anxious behavior, palpitations and restlessness. Patient reports no compulsions, confusion, decreased concentration, dizziness, dry mouth, excessive worry, hyperventilation, impotence, insomnia, irritability, malaise, muscle tension, nausea, obsessions, panic, shortness of breath or suicidal ideas. Symptoms occur occasionally.      Lab Results  Component Value Date   CREATININE 0.82 02/25/2019   BUN 6 02/25/2019   NA 136 02/25/2019   K 3.4 (L) 02/25/2019   CL 101 02/25/2019   CO2 23 02/25/2019   Lab Results  Component Value Date   CHOL 183 10/18/2016   HDL 45 10/18/2016   LDLCALC 123 (H) 10/18/2016   TRIG 75 10/18/2016   CHOLHDL 4.1 10/18/2016   Lab Results  Component Value Date   TSH 0.80 10/29/2011   No results found for: HGBA1C Lab Results  Component Value Date   WBC 8.8 02/25/2019   HGB 13.9 02/25/2019   HCT 39.2 02/25/2019   MCV 96.1 02/25/2019   PLT 165 02/25/2019   Lab Results  Component Value Date   ALT 46 (H) 02/25/2019   AST 105 (H) 02/25/2019   ALKPHOS 221 (H) 02/25/2019   BILITOT 2.2 (H) 02/25/2019     Review of Systems  Constitutional: Negative for chills, diaphoresis, fever, irritability and malaise/fatigue.  HENT: Negative for  drooling, ear discharge, ear pain and sore throat.   Respiratory: Negative for cough, shortness of breath and wheezing.   Cardiovascular: Positive for chest pain and palpitations. Negative for leg swelling, syncope and near-syncope.  Gastrointestinal: Negative for abdominal pain, blood in stool, constipation, diarrhea, nausea and vomiting.  Endocrine: Negative for polydipsia.  Genitourinary: Negative for dysuria, frequency, hematuria, impotence and urgency.  Musculoskeletal: Negative for back pain, myalgias and neck pain.  Skin: Negative for rash.  Allergic/Immunologic: Negative for environmental allergies.  Neurological: Negative for dizziness, weakness, numbness and headaches.  Hematological: Does not bruise/bleed easily.  Psychiatric/Behavioral: Negative for confusion, decreased concentration and suicidal ideas. The patient is nervous/anxious. The patient does not have insomnia.     Patient Active Problem List   Diagnosis Date Noted  . RA (rheumatoid arthritis) (HCC) 09/13/2015  . Rheumatoid arthritis (HCC) 09/13/2015  . Laceration of right hand with complication 08/10/2014    No Known Allergies  Past Surgical History:  Procedure Laterality Date  . TIBIA FRACTURE SURGERY Left   . TONSILLECTOMY      Social History   Tobacco Use  . Smoking status: Former Smoker    Packs/day: 0.50    Years: 27.00    Pack years: 13.50    Types: Cigarettes    Quit date: 04/27/2020    Years since quitting: 0.0  . Smokeless tobacco: Never Used  . Tobacco comment: using nicorett gum  Vaping Use  . Vaping Use: Never used  Substance Use Topics  . Alcohol use: Yes    Alcohol/week: 2.0 standard drinks  Types: 2 Cans of beer per week  . Drug use: Yes    Frequency: 7.0 times per week    Types: Marijuana    Comment: smokes once a day      Medication list has been reviewed and updated.  Current Meds  Medication Sig  . cyclobenzaprine (FLEXERIL) 10 MG tablet Take 1 tablet by mouth 3  (three) times daily. Dr Alfredo Batty  . doxycycline (VIBRA-TABS) 100 MG tablet Take 1 tablet (100 mg total) by mouth 2 (two) times daily.  . folic acid (FOLVITE) 1 MG tablet Take 1 mg by mouth.  . gabapentin (NEURONTIN) 300 MG capsule Take 1 capsule (300 mg total) by mouth at bedtime. Dr Alfredo Batty  . hydrOXYzine (VISTARIL) 25 MG capsule Take 1 capsule (25 mg total) by mouth every 6 (six) hours as needed.  . mupirocin ointment (BACTROBAN) 2 % Apply 1 application topically 2 (two) times daily.  . Naproxen Sodium 220 MG CAPS Aleve 220 mg capsule  prn  . Omega-3 Fatty Acids (FISH OIL) 1000 MG CAPS Take 1 capsule daily  . QUEtiapine (SEROQUEL) 50 MG tablet Take 1 tablet (50 mg total) by mouth 3 (three) times daily.  . Vitamin D, Cholecalciferol, 400 units TABS Take 1 tablet by mouth 1 day or 1 dose.    PHQ 2/9 Scores 10/22/2019 04/04/2019 03/01/2018 08/29/2017  PHQ - 2 Score 0 3 0 4  PHQ- 9 Score 0 5 0 10    GAD 7 : Generalized Anxiety Score 10/22/2019 04/04/2019  Nervous, Anxious, on Edge 1 2  Control/stop worrying 0 0  Worry too much - different things 0 0  Trouble relaxing 1 0  Restless 1 0  Easily annoyed or irritable 1 0  Afraid - awful might happen 0 0  Total GAD 7 Score 4 2  Anxiety Difficulty Somewhat difficult Not difficult at all    BP Readings from Last 3 Encounters:  05/13/20 130/88  10/22/19 128/80  04/04/19 130/70    Physical Exam Vitals and nursing note reviewed.  HENT:     Head: Normocephalic.     Right Ear: Tympanic membrane, ear canal and external ear normal. There is no impacted cerumen.     Left Ear: Tympanic membrane, ear canal and external ear normal. There is no impacted cerumen.     Nose: Nose normal. No congestion or rhinorrhea.     Mouth/Throat:     Mouth: Mucous membranes are moist.  Eyes:     General: No scleral icterus.       Right eye: No discharge.        Left eye: No discharge.     Conjunctiva/sclera: Conjunctivae normal.     Pupils: Pupils are equal,  round, and reactive to light.  Neck:     Thyroid: No thyromegaly.     Vascular: No JVD.     Trachea: No tracheal deviation.  Cardiovascular:     Rate and Rhythm: Normal rate and regular rhythm.     Heart sounds: Normal heart sounds. No murmur heard. No friction rub. No gallop.   Pulmonary:     Effort: No respiratory distress.     Breath sounds: Normal breath sounds. No wheezing, rhonchi or rales.  Abdominal:     General: Bowel sounds are normal.     Palpations: Abdomen is soft. There is no mass.     Tenderness: There is no abdominal tenderness. There is no guarding or rebound.  Musculoskeletal:        General:  No swelling, tenderness or deformity. Normal range of motion.     Cervical back: Normal range of motion and neck supple.  Lymphadenopathy:     Cervical: No cervical adenopathy.  Skin:    General: Skin is warm.     Findings: No rash.  Neurological:     Mental Status: He is alert and oriented to person, place, and time.     Cranial Nerves: No cranial nerve deficit.     Deep Tendon Reflexes: Reflexes are normal and symmetric.     Wt Readings from Last 3 Encounters:  05/13/20 201 lb (91.2 kg)  10/22/19 204 lb (92.5 kg)  04/04/19 198 lb (89.8 kg)    BP 130/88   Pulse 80   Ht 5\' 7"  (1.702 m)   Wt 201 lb (91.2 kg)   BMI 31.48 kg/m   Assessment and Plan: 1. PTSD (post-traumatic stress disorder) Chronic.  Episodic.  Patient had a recent flare of his PTSD necessitating involvement of psychiatry at [facility].  Patient was started on Seroquel 50 mg 1 tablet 3 times a day as well as hydroxyzine 25 mg 1 3 times a day as well.  We will continue this at this time until he is established with psychiatry in which in hopes that this will be carried forth by them as well.  In the meantime patient does have a psychologist that he will be referring to a Steele Sizer for current needs. - Ambulatory referral to Psychiatry - hydrOXYzine (VISTARIL) 25 MG capsule; Take 1 capsule (25 mg total)  by mouth 3 (three) times daily.  Dispense: 90 capsule; Refill: 1 - QUEtiapine (SEROQUEL) 50 MG tablet; Take 1 tablet (50 mg total) by mouth 3 (three) times daily.  Dispense: 90 tablet; Refill: 1  2. Anxiety Chronic.  Episodic.  Stable.  Patient is currently controlling on hydroxyzine 25 mg 3 times a day.  3. Alcohol use disorder, moderate, dependence (HCC) Patient is currently controlled with medications below for alcohol use disorder and has been doing well without alcohol or the past several days. - Ambulatory referral to Psychiatry - hydrOXYzine (VISTARIL) 25 MG capsule; Take 1 capsule (25 mg total) by mouth 3 (three) times daily.  Dispense: 90 capsule; Refill: 1 - QUEtiapine (SEROQUEL) 50 MG tablet; Take 1 tablet (50 mg total) by mouth 3 (three) times daily.  Dispense: 90 tablet; Refill: 1  4. Cigarette nicotine dependence in remission Patient is also doing well with nicotine dependence with out having to smoke and has been using nicotine lozenges at this time.

## 2020-05-17 DIAGNOSIS — Z23 Encounter for immunization: Secondary | ICD-10-CM | POA: Diagnosis not present

## 2020-05-19 ENCOUNTER — Telehealth: Payer: Self-pay

## 2020-05-19 NOTE — Telephone Encounter (Signed)
Copied from CRM [id number]. Topic: Quick Communication - Rx Refill/Question >> May 19, 2020  2:57 PM Aretta Nip wrote: Medication: hydrOXYzine (VISTARIL) 25 MG capsule 90 capsule 1 05/13/2020   Sig - Route: Take 1 capsule (25 mg total) by mouth 3 (three) times daily. - Oral   QUEtiapine (SEROQUEL) 50 MG tablet 90 tablet 1 05/13/2020   Sig - Route: Take 1 tablet (50 mg total) by mouth 3 (three) times daily. - Oral  Class: No Print   [id number] - [place], [place] - [address]  Phone:  [phone] Fax:  [phone]  These scripts look to be sent but have no receipt and pt has been trying to obtain since 3/31    Agent: Please be advised that RX refills may take up to 3 business days. We ask that you follow-up with your pharmacy.

## 2020-05-20 ENCOUNTER — Other Ambulatory Visit: Payer: Self-pay

## 2020-05-20 DIAGNOSIS — F1020 Alcohol dependence, uncomplicated: Secondary | ICD-10-CM

## 2020-05-20 DIAGNOSIS — F4310 Post-traumatic stress disorder, unspecified: Secondary | ICD-10-CM

## 2020-05-20 MED ORDER — QUETIAPINE FUMARATE 50 MG PO TABS: 50.0000 mg | ORAL_TABLET | Freq: Three times a day (TID) | ORAL | 1 refills | Status: DC

## 2020-05-20 MED ORDER — HYDROXYZINE PAMOATE 25 MG PO CAPS: 25.0000 mg | ORAL_CAPSULE | Freq: Three times a day (TID) | ORAL | 1 refills | Status: DC

## 2020-05-20 NOTE — Telephone Encounter (Signed)
Sent in hydroxy and seroquel

## 2020-05-20 NOTE — Progress Notes (Signed)
Sent in hydroxy and seroquel until pt can see psych

## 2020-05-26 DIAGNOSIS — F4312 Post-traumatic stress disorder, chronic: Secondary | ICD-10-CM | POA: Diagnosis not present

## 2020-05-26 DIAGNOSIS — F54 Psychological and behavioral factors associated with disorders or diseases classified elsewhere: Secondary | ICD-10-CM | POA: Diagnosis not present

## 2020-06-02 DIAGNOSIS — F54 Psychological and behavioral factors associated with disorders or diseases classified elsewhere: Secondary | ICD-10-CM | POA: Diagnosis not present

## 2020-06-02 DIAGNOSIS — F4312 Post-traumatic stress disorder, chronic: Secondary | ICD-10-CM | POA: Diagnosis not present

## 2020-06-09 DIAGNOSIS — Z1389 Encounter for screening for other disorder: Secondary | ICD-10-CM | POA: Diagnosis not present

## 2020-06-09 DIAGNOSIS — F4312 Post-traumatic stress disorder, chronic: Secondary | ICD-10-CM | POA: Diagnosis not present

## 2020-06-09 DIAGNOSIS — F54 Psychological and behavioral factors associated with disorders or diseases classified elsewhere: Secondary | ICD-10-CM | POA: Diagnosis not present

## 2020-06-09 DIAGNOSIS — Z79899 Other long term (current) drug therapy: Secondary | ICD-10-CM | POA: Diagnosis not present

## 2020-06-16 DIAGNOSIS — F4312 Post-traumatic stress disorder, chronic: Secondary | ICD-10-CM | POA: Diagnosis not present

## 2020-06-16 DIAGNOSIS — F54 Psychological and behavioral factors associated with disorders or diseases classified elsewhere: Secondary | ICD-10-CM | POA: Diagnosis not present

## 2020-06-17 ENCOUNTER — Other Ambulatory Visit: Payer: Self-pay | Admitting: Family Medicine

## 2020-06-17 DIAGNOSIS — F4310 Post-traumatic stress disorder, unspecified: Secondary | ICD-10-CM

## 2020-06-17 DIAGNOSIS — F1020 Alcohol dependence, uncomplicated: Secondary | ICD-10-CM

## 2020-06-17 NOTE — Telephone Encounter (Signed)
Requesting 90 day supply Looks likes another doctor maybe taking this medication on    Requested Prescriptions  Pending Prescriptions Disp Refills   hydrOXYzine (VISTARIL) 25 MG capsule [Pharmacy Med Name: HYDROXYZINE PAM 25 MG CAP] 270 capsule 1    Sig: TAKE 1 CAPSULE BY MOUTH 3 TIMES DAILY.      Ear, Nose, and Throat:  Antihistamines Passed - 06/17/2020  2:32 PM      Passed - Valid encounter within last 12 months    Recent Outpatient Visits           1 month ago PTSD (post-traumatic stress disorder)   [facility] Duanne Limerick, MD   7 months ago Facial abscess   [facility] Duanne Limerick, MD   1 year ago Genital candidiasis in male   [facility] Duanne Limerick, MD   2 years ago Tinea cruris   [facility] Duanne Limerick, MD   3 years ago Urticaria   [facility] Duanne Limerick, MD

## 2020-06-24 DIAGNOSIS — M0579 Rheumatoid arthritis with rheumatoid factor of multiple sites without organ or systems involvement: Secondary | ICD-10-CM | POA: Diagnosis not present

## 2020-06-24 DIAGNOSIS — M150 Primary generalized (osteo)arthritis: Secondary | ICD-10-CM | POA: Diagnosis not present

## 2020-06-30 DIAGNOSIS — F4312 Post-traumatic stress disorder, chronic: Secondary | ICD-10-CM | POA: Diagnosis not present

## 2020-06-30 DIAGNOSIS — F54 Psychological and behavioral factors associated with disorders or diseases classified elsewhere: Secondary | ICD-10-CM | POA: Diagnosis not present

## 2020-07-07 DIAGNOSIS — Z79899 Other long term (current) drug therapy: Secondary | ICD-10-CM | POA: Diagnosis not present

## 2020-07-07 DIAGNOSIS — F4310 Post-traumatic stress disorder, unspecified: Secondary | ICD-10-CM | POA: Diagnosis not present

## 2020-07-07 DIAGNOSIS — F4312 Post-traumatic stress disorder, chronic: Secondary | ICD-10-CM | POA: Diagnosis not present

## 2020-07-07 DIAGNOSIS — F54 Psychological and behavioral factors associated with disorders or diseases classified elsewhere: Secondary | ICD-10-CM | POA: Diagnosis not present

## 2020-07-07 DIAGNOSIS — F411 Generalized anxiety disorder: Secondary | ICD-10-CM | POA: Diagnosis not present

## 2020-07-08 ENCOUNTER — Telehealth: Payer: Self-pay

## 2020-07-08 NOTE — Telephone Encounter (Unsigned)
Copied from CRM [id number]. Topic: General - Other >> Jul 08, 2020  9:25 AM Daphine Deutscher D wrote: Reason for CRM: Lanney Gins with [facility] called saying pt is on Seroquel  She said he told her at his rheumatology his BS was in the 400's  He never had this rechecked.  She did labs yesterday and will be faxing the results over as soon as she gets them.back  [phone]  x [phone]

## 2020-07-08 NOTE — Telephone Encounter (Signed)
Please call pt to schedule an appt for high blood sugar next week.  KP

## 2020-07-09 LAB — CBC: RBC: 4.87 (ref 3.87–5.11)

## 2020-07-09 LAB — BASIC METABOLIC PANEL
BUN: 7 (ref 4–21)
Chloride: 99 (ref 99–108)
Creatinine: 0.8 (ref 0.6–1.3)
Glucose: 263
Potassium: 4.3 (ref 3.4–5.3)
Sodium: 133 — AB (ref 137–147)

## 2020-07-09 LAB — HEPATIC FUNCTION PANEL
ALT: 19 (ref 10–40)
AST: 20 (ref 14–40)
Alkaline Phosphatase: 111 (ref 25–125)

## 2020-07-09 LAB — CBC AND DIFFERENTIAL
HCT: 42 (ref 41–53)
Hemoglobin: 13.7 (ref 13.5–17.5)
Platelets: 204 (ref 150–399)
WBC: 6.1

## 2020-07-09 LAB — LIPID PANEL
Cholesterol: 185 (ref 0–200)
HDL: 36 (ref 35–70)
LDL Cholesterol: 125
Triglycerides: 126 (ref 40–160)

## 2020-07-09 LAB — HEMOGLOBIN A1C: Hemoglobin A1C: 10.9

## 2020-07-09 LAB — TSH: TSH: 2.24 (ref 0.41–5.90)

## 2020-07-09 NOTE — Telephone Encounter (Signed)
Called Mr Pallone told him to have [facility] to send over blood sugar results and once we received them to call and set up appointment.

## 2020-07-13 ENCOUNTER — Encounter: Payer: Self-pay | Admitting: Family Medicine

## 2020-07-13 ENCOUNTER — Other Ambulatory Visit: Payer: Self-pay

## 2020-07-13 ENCOUNTER — Ambulatory Visit (INDEPENDENT_AMBULATORY_CARE_PROVIDER_SITE_OTHER): Payer: Medicare Other | Admitting: Family Medicine

## 2020-07-13 VITALS — BP 110/60 | HR 100 | Ht 67.0 in | Wt 204.0 lb

## 2020-07-13 DIAGNOSIS — E119 Type 2 diabetes mellitus without complications: Secondary | ICD-10-CM

## 2020-07-13 DIAGNOSIS — R739 Hyperglycemia, unspecified: Secondary | ICD-10-CM | POA: Diagnosis not present

## 2020-07-13 DIAGNOSIS — L910 Hypertrophic scar: Secondary | ICD-10-CM

## 2020-07-13 DIAGNOSIS — L739 Follicular disorder, unspecified: Secondary | ICD-10-CM

## 2020-07-13 LAB — GLUCOSE, POCT (MANUAL RESULT ENTRY): POC Glucose: 236 mg/dl — AB (ref 70–99)

## 2020-07-13 MED ORDER — METFORMIN HCL 500 MG PO TABS: 500.0000 mg | ORAL_TABLET | Freq: Two times a day (BID) | ORAL | 1 refills | Status: DC

## 2020-07-13 NOTE — Patient Instructions (Addendum)
Diabetes Mellitus and Nutrition, Adult When you have diabetes, or diabetes mellitus, it is very important to have healthy eating habits because your blood sugar (glucose) levels are greatly affected by what you eat and drink. Eating healthy foods in the right amounts, at about the same times every day, can help you:  Control your blood glucose.  Lower your risk of heart disease.  Improve your blood pressure.  Reach or maintain a healthy weight. What can affect my meal plan? Every person with diabetes is different, and each person has different needs for a meal plan. Your health care provider may recommend that you work with a dietitian to make a meal plan that is best for you. Your meal plan may vary depending on factors such as:  The calories you need.  The medicines you take.  Your weight.  Your blood glucose, blood pressure, and cholesterol levels.  Your activity level.  Other health conditions you have, such as heart or kidney disease. How do carbohydrates affect me? Carbohydrates, also called carbs, affect your blood glucose level more than any other type of food. Eating carbs naturally raises the amount of glucose in your blood. Carb counting is a method for keeping track of how many carbs you eat. Counting carbs is important to keep your blood glucose at a healthy level, especially if you use insulin or take certain oral diabetes medicines. It is important to know how many carbs you can safely have in each meal. This is different for every person. Your dietitian can help you calculate how many carbs you should have at each meal and for each snack. How does alcohol affect me? Alcohol can cause a sudden decrease in blood glucose (hypoglycemia), especially if you use insulin or take certain oral diabetes medicines. Hypoglycemia can be a life-threatening condition. Symptoms of hypoglycemia, such as sleepiness, dizziness, and confusion, are similar to symptoms of  having too much alcohol.  Do not drink alcohol if: ? Your health care provider tells you not to drink. ? You are pregnant, may be pregnant, or are planning to become pregnant.  If you drink alcohol: ? Do not drink on an empty stomach. ? Limit how much you use to:  0-1 drink a day for women.  0-2 drinks a day for men. ? Be aware of how much alcohol is in your drink. In the [place] one drink equals one 12 oz bottle of beer (355 mL), one 5 oz glass of wine (148 mL), or one 1 oz glass of hard liquor (44 mL). ? Keep yourself hydrated with water, diet soda, or unsweetened iced tea.  Keep in mind that regular soda, juice, and other mixers may contain a lot of sugar and must be counted as carbs. What are tips for following this plan? Reading food labels  Start by checking the serving size on the "Nutrition Facts" label of packaged foods and drinks. The amount of calories, carbs, fats, and other nutrients listed on the label is based on one serving of the item. Many items contain more than one serving per package.  Check the total grams (g) of carbs in one serving. You can calculate the number of servings of carbs in one serving by dividing the total carbs by 15. For example, if a food has 30 g of total carbs per serving, it would be equal to 2 servings of carbs.  Check the number of grams (g) of saturated fats and trans fats in one serving. Choose foods that have a low amount or none of these fats.  Check the number of milligrams (mg) of salt (sodium) in one serving. Most people should limit total sodium intake to less than 2,300 mg per day.  Always check the nutrition information of foods labeled as "low-fat" or "nonfat." These foods may be higher in added sugar or refined carbs and should be avoided.  Talk to your dietitian to identify your daily goals for nutrients listed on the label. Shopping  Avoid buying canned, pre-made, or processed foods. These foods tend to be high in fat, sodium,  and added sugar.  Shop around the outside edge of the grocery store. This is where you will most often find fresh fruits and vegetables, bulk grains, fresh meats, and fresh dairy. Cooking  Use low-heat cooking methods, such as baking, instead of high-heat cooking methods like deep frying.  Cook using healthy oils, such as olive, canola, or sunflower oil.  Avoid cooking with butter, cream, or high-fat meats. Meal planning  Eat meals and snacks regularly, preferably at the same times every day. Avoid going long periods of time without eating.  Eat foods that are high in fiber, such as fresh fruits, vegetables, beans, and whole grains. Talk with your dietitian about how many servings of carbs you can eat at each meal.  Eat 4-6 oz (112-168 g) of lean protein each day, such as lean meat, chicken, fish, eggs, or tofu. One ounce (oz) of lean protein is equal to: ? 1 oz (28 g) of meat, chicken, or fish. ? 1 egg. ?  cup (62 g) of tofu.  Eat some foods each day that contain healthy fats, such as avocado, nuts, seeds, and fish.   What foods should I eat? Fruits Berries. Apples. Oranges. Peaches. Apricots. Plums. Grapes. Mango. Papaya. Pomegranate. Kiwi. Cherries. Vegetables Lettuce. Spinach. Leafy greens, including kale, chard, collard greens, and mustard greens. Beets. Cauliflower. Cabbage. Broccoli. Carrots. Green beans. Tomatoes. Peppers. Onions. Cucumbers. Brussels sprouts. Grains Whole grains, such as whole-wheat or whole-grain bread, crackers, tortillas, cereal, and pasta. Unsweetened oatmeal. Quinoa. Brown or wild rice. Meats and other proteins Seafood. Poultry without skin. Lean cuts of poultry and beef. Tofu. Nuts. Seeds. Dairy Low-fat or fat-free dairy products such as milk, yogurt, and cheese. The items listed above may not be a complete list of foods and beverages you can eat. Contact a dietitian for more information. What foods should I avoid? Fruits Fruits canned with  syrup. Vegetables Canned vegetables. Frozen vegetables with butter or cream sauce. Grains Refined white flour and flour products such as bread, pasta, snack foods, and cereals. Avoid all processed foods. Meats and other proteins Fatty cuts of meat. Poultry with skin. Breaded or fried meats. Processed meat. Avoid saturated fats. Dairy Full-fat yogurt, cheese, or milk. Beverages Sweetened drinks, such as soda or iced tea. The items listed above may not be a complete list of foods and beverages you should avoid. Contact a dietitian for more information. Questions to ask a health care provider  Do I need to meet with a diabetes educator?  Do I need to meet with a dietitian?  What number can I call if I have questions?  When are the best times to check my blood glucose? Where to find more information:  American Diabetes Association: diabetes.org  Academy of Nutrition and Dietetics: www.eatright.[employer] of Diabetes and Digestive and Kidney Diseases:  CarFlippers.tn  Association of Diabetes Care and Education Specialists: www.diabeteseducator.org Summary  It is important to have healthy eating habits because your blood sugar (glucose) levels are greatly affected by what you eat and drink.  A healthy meal plan will help you control your blood glucose and maintain a healthy lifestyle.  Your health care provider may recommend that you work with a dietitian to make a meal plan that is best for you.  Keep in mind that carbohydrates (carbs) and alcohol have immediate effects on your blood glucose levels. It is important to count carbs and to use alcohol carefully. This information is not intended to replace advice given to you by your health care provider. Make sure you discuss any questions you have with your health care provider. Document Revised: 01/07/2019 Document Reviewed: 01/07/2019 Elsevier Patient Education  2021 Elsevier Inc.  Preventing High  Cholesterol Cholesterol is a white, waxy substance similar to fat that the human body needs to help build cells. The liver makes all the cholesterol that a person's body needs. Having high cholesterol (hypercholesterolemia) increases your risk for heart disease and stroke. Extra or excess cholesterol comes from the food that you eat. High cholesterol can often be prevented with diet and lifestyle changes. If you already have high cholesterol, you can control it with diet, lifestyle changes, and medicines. How can high cholesterol affect me? If you have high cholesterol, fatty deposits (plaques) may build up on the walls of your blood vessels. The blood vessels that carry blood away from your heart are called arteries. Plaques make the arteries narrower and stiffer. This in turn can:  Restrict or block blood flow and cause blood clots to form.  Increase your risk for heart attack and stroke. What can increase my risk for high cholesterol? This condition is more likely to develop in people who:  Eat foods that are high in saturated fat or cholesterol. Saturated fat is mostly found in foods that come from animal sources.  Are overweight.  Are not getting enough exercise.  Have a family history of high cholesterol (familial hypercholesterolemia). What actions can I take to prevent this? Nutrition  Eat less saturated fat.  Avoid trans fats (partially hydrogenated oils). These are often found in margarine and in some baked goods, fried foods, and snacks bought in packages.  Avoid precooked or cured meat, such as bacon, sausages, or meat loaves.  Avoid foods and drinks that have added sugars.  Eat more fruits, vegetables, and whole grains.  Choose healthy sources of protein, such as fish, poultry, lean cuts of red meat, beans, peas, lentils, and nuts.  Choose healthy sources of fat, such as: ? Nuts. ? Vegetable oils, especially olive oil. ? Fish that have healthy fats, such as omega-3  fatty acids. These fish include mackerel or salmon.   Lifestyle  Lose weight if you are overweight. Maintaining a healthy body mass index (BMI) can help prevent or control high cholesterol. It can also lower your risk for diabetes and high blood pressure. Ask your health care provider to help you with a diet and exercise plan to lose weight safely.  Do not use any products that contain nicotine or tobacco, such as cigarettes, e-cigarettes, and chewing tobacco. If you need help quitting, ask your health care provider. Alcohol use  Do not drink alcohol if: ? Your health care provider tells you not to drink. ? You are pregnant, may be pregnant, or are planning to become pregnant.  If you drink alcohol: ?  Limit how much you use to:  0-1 drink a day for women.  0-2 drinks a day for men. ? Be aware of how much alcohol is in your drink. In the [place] one drink equals one 12 oz bottle of beer (355 mL), one 5 oz glass of wine (148 mL), or one 1 oz glass of hard liquor (44 mL). Activity  Get enough exercise. Do exercises as told by your health care provider.  Each week, do at least 150 minutes of exercise that takes a medium level of effort (moderate-intensity exercise). This kind of exercise: ? Makes your heart beat faster while allowing you to still be able to talk. ? Can be done in short sessions several times a day or longer sessions a few times a week. For example, on 5 days each week, you could walk fast or ride your bike 3 times a day for 10 minutes each time.   Medicines  Your health care provider may recommend medicines to help lower cholesterol. This may be a medicine to lower the amount of cholesterol that your liver makes. You may need medicine if: ? Diet and lifestyle changes have not lowered your cholesterol enough. ? You have high cholesterol and other risk factors for heart disease or stroke.  Take over-the-counter and prescription medicines only as told by your health care  provider. General information  Manage your risk factors for high cholesterol. Talk with your health care provider about all your risk factors and how to lower your risk.  Manage other conditions that you have, such as diabetes or high blood pressure (hypertension).  Have blood tests to check your cholesterol levels at regular points in time as told by your health care provider.  Keep all follow-up visits as told by your health care provider. This is important. Where to find more information  American Heart Association: www.heart.org  National Heart, Lung, and Blood Institute: PopSteam.is Summary  High cholesterol increases your risk for heart disease and stroke. By keeping your cholesterol level low, you can reduce your risk for these conditions.  High cholesterol can often be prevented with diet and lifestyle changes.  Work with your health care provider to manage your risk factors, and have your blood tested regularly. This information is not intended to replace advice given to you by your health care provider. Make sure you discuss any questions you have with your health care provider. Document Revised: 11/12/2018 Document Reviewed: 11/12/2018 Elsevier Patient Education  2021 [employer].

## 2020-07-13 NOTE — Progress Notes (Signed)
Date:  07/13/2020   Name:  Patrick Perez   DOB:  1965-03-19   MRN:  [id number]   Chief Complaint: Hyperglycemia (Elevated at another doctor's office) and raised areas (Has noticed raised areas on tattoos on legs x 4 months- has stopped injections of methotrexate x 1 year)  Hyperglycemia This is a new problem. The current episode started in the past 7 days. The problem occurs daily. The problem has been waxing and waning. Pertinent negatives include no abdominal pain, chest pain, chills, coughing, fatigue, fever, headaches, joint swelling, myalgias, nausea, neck pain, rash or sore throat.  Rash This is a new problem. The current episode started more than 1 month ago. The problem has been gradually worsening since onset. Pertinent negatives include no cough, diarrhea, fatigue, fever, shortness of breath or sore throat.    Lab Results  Component Value Date   CREATININE 0.82 02/25/2019   BUN 6 02/25/2019   NA 136 02/25/2019   K 3.4 (L) 02/25/2019   CL 101 02/25/2019   CO2 23 02/25/2019   Lab Results  Component Value Date   CHOL 183 10/18/2016   HDL 45 10/18/2016   LDLCALC 123 (H) 10/18/2016   TRIG 75 10/18/2016   CHOLHDL 4.1 10/18/2016   Lab Results  Component Value Date   TSH 0.80 10/29/2011   No results found for: HGBA1C Lab Results  Component Value Date   WBC 8.8 02/25/2019   HGB 13.9 02/25/2019   HCT 39.2 02/25/2019   MCV 96.1 02/25/2019   PLT 165 02/25/2019   Lab Results  Component Value Date   ALT 46 (H) 02/25/2019   AST 105 (H) 02/25/2019   ALKPHOS 221 (H) 02/25/2019   BILITOT 2.2 (H) 02/25/2019     Review of Systems  Constitutional: Negative for chills, fatigue and fever.  HENT: Negative for drooling, ear discharge, ear pain and sore throat.   Respiratory: Negative for cough, shortness of breath and wheezing.   Cardiovascular: Negative for chest pain, palpitations and leg swelling.  Gastrointestinal: Negative for abdominal pain, blood in stool,  constipation, diarrhea and nausea.  Endocrine: Positive for polydipsia and polyuria.       Nocturia  Genitourinary: Negative for dysuria, frequency, hematuria and urgency.  Musculoskeletal: Negative for back pain, joint swelling, myalgias and neck pain.  Skin: Negative for rash.  Allergic/Immunologic: Negative for environmental allergies.  Neurological: Negative for dizziness and headaches.  Hematological: Does not bruise/bleed easily.  Psychiatric/Behavioral: Negative for suicidal ideas. The patient is not nervous/anxious.     Patient Active Problem List   Diagnosis Date Noted  . RA (rheumatoid arthritis) (HCC) 09/13/2015  . Rheumatoid arthritis (HCC) 09/13/2015  . Laceration of right hand with complication 08/10/2014    No Known Allergies  Past Surgical History:  Procedure Laterality Date  . TIBIA FRACTURE SURGERY Left   . TONSILLECTOMY      Social History   Tobacco Use  . Smoking status: Former Smoker    Packs/day: 0.50    Years: 27.00    Pack years: 13.50    Types: Cigarettes    Quit date: 04/27/2020    Years since quitting: 0.2  . Smokeless tobacco: Never Used  . Tobacco comment: using nicorett gum  Vaping Use  . Vaping Use: Never used  Substance Use Topics  . Alcohol use: Yes    Alcohol/week: 2.0 standard drinks    Types: 2 Cans of beer per week  . Drug use: Yes    Frequency: 7.0 times  per week    Types: Marijuana    Comment: smokes once a day      Medication list has been reviewed and updated.  Current Meds  Medication Sig  . cyclobenzaprine (FLEXERIL) 10 MG tablet Take 1 tablet by mouth 3 (three) times daily. Dr Alfredo Batty  . doxycycline (VIBRA-TABS) 100 MG tablet Take 1 tablet (100 mg total) by mouth 2 (two) times daily.  Marland Kitchen FLUoxetine (PROZAC) 10 MG capsule Take 1 capsule by mouth daily. Dr Keturah Barre  . folic acid (FOLVITE) 1 MG tablet Take 1 mg by mouth.  . gabapentin (NEURONTIN) 100 MG capsule Take 100 mg by mouth 2 (two) times daily.  Marland Kitchen gabapentin  (NEURONTIN) 300 MG capsule Take 1 capsule (300 mg total) by mouth at bedtime. Dr Alfredo Batty  . hydrOXYzine (VISTARIL) 25 MG capsule Take 1 capsule (25 mg total) by mouth 3 (three) times daily. (Patient taking differently: Take 25 mg by mouth 3 (three) times daily. bagey)  . mupirocin ointment (BACTROBAN) 2 % Apply 1 application topically 2 (two) times daily.  . Naproxen Sodium 220 MG CAPS Aleve 220 mg capsule  prn  . Omega-3 Fatty Acids (FISH OIL) 1000 MG CAPS Take 1 capsule daily  . QUEtiapine (SEROQUEL) 50 MG tablet Take 1 tablet (50 mg total) by mouth 3 (three) times daily. (Patient taking differently: Take 50 mg by mouth 3 (three) times daily. Bagey)  . traMADol (ULTRAM) 50 MG tablet tramadol 50 mg tablet  . Vitamin D, Cholecalciferol, 400 units TABS Take 1 tablet by mouth 1 day or 1 dose.    PHQ 2/9 Scores 07/13/2020 10/22/2019 04/04/2019 03/01/2018  PHQ - 2 Score 0 0 3 0  PHQ- 9 Score 1 0 5 0    GAD 7 : Generalized Anxiety Score 07/13/2020 10/22/2019 04/04/2019  Nervous, Anxious, on Edge 0 1 2  Control/stop worrying 0 0 0  Worry too much - different things 0 0 0  Trouble relaxing 0 1 0  Restless 0 1 0  Easily annoyed or irritable 0 1 0  Afraid - awful might happen 0 0 0  Total GAD 7 Score 0 4 2  Anxiety Difficulty - Somewhat difficult Not difficult at all    BP Readings from Last 3 Encounters:  07/13/20 110/60  05/13/20 130/88  10/22/19 128/80    Physical Exam Vitals and nursing note reviewed.  HENT:     Head: Normocephalic.     Right Ear: Tympanic membrane, ear canal and external ear normal.     Left Ear: Tympanic membrane, ear canal and external ear normal.     Nose: Nose normal.  Eyes:     General: No scleral icterus.       Right eye: No discharge.        Left eye: No discharge.     Conjunctiva/sclera: Conjunctivae normal.     Pupils: Pupils are equal, round, and reactive to light.  Neck:     Thyroid: No thyromegaly.     Vascular: No JVD.     Trachea: No tracheal  deviation.  Cardiovascular:     Rate and Rhythm: Normal rate and regular rhythm.     Heart sounds: Normal heart sounds. No murmur heard. No friction rub. No gallop.   Pulmonary:     Effort: No respiratory distress.     Breath sounds: Normal breath sounds. No wheezing or rales.  Abdominal:     General: Bowel sounds are normal.     Palpations: Abdomen is soft. There  is no mass.     Tenderness: There is no abdominal tenderness. There is no guarding or rebound.  Musculoskeletal:        General: No tenderness. Normal range of motion.     Cervical back: Normal range of motion and neck supple.  Lymphadenopathy:     Cervical: No cervical adenopathy.  Skin:    General: Skin is warm.     Findings: Rash present. Rash is nodular and pustular.  Neurological:     Mental Status: He is alert and oriented to person, place, and time.     Cranial Nerves: No cranial nerve deficit.     Deep Tendon Reflexes: Reflexes are normal and symmetric.     Wt Readings from Last 3 Encounters:  07/13/20 204 lb (92.5 kg)  05/13/20 201 lb (91.2 kg)  10/22/19 204 lb (92.5 kg)    BP 110/60   Pulse 100   Ht 5\' 7"  (1.702 m)   Wt 204 lb (92.5 kg)   BMI 31.95 kg/m   Assessment and Plan:  1. Hyperglycemia Relatively new onset.  Recheck in previous glucoses they have been in the 100-1 10 range but recently was noted to have a glucose of over 200 by rheumatology.  Patient's recently been started on Seroquel and this may be a concern here point-of-care glucose noted to be over 200 today but patient had a sugar based cough drop taken earlier.  We will check a renal function panel A1c to confirm if there is development of diabetes. - Renal Function Panel - Hemoglobin A1c - POCT Glucose (CBG)  2. Folliculitis Chronic.  Persistent.  Generalized is noted that he has folliculitis particularly where he shaves his hair along his scalp which may be causing part of #3 as well.  Patient has seen dermatology in the past for  cyst removal we will refer to dermatology for evaluation of folliculitis as well as recent preparation of keloids in tattoo areas. - Ambulatory referral to Dermatology  3. Keloid skin disorder New onset.  This is interesting and that this only involves the tattoos that he does by himself on his lower extremities.  They are palpable areas of firmness but only in darkening areas.  This is consistent with keloid formation I am wondering if this because of the aggressiveness of him doing his own keloids versus the type of ink he is using. - Ambulatory referral to Dermatology

## 2020-07-13 NOTE — Addendum Note (Signed)
Addended by: Everitt Amber on: 07/13/2020 12:16 PM   Modules accepted: Orders

## 2020-07-14 DIAGNOSIS — F4312 Post-traumatic stress disorder, chronic: Secondary | ICD-10-CM | POA: Diagnosis not present

## 2020-07-14 DIAGNOSIS — F54 Psychological and behavioral factors associated with disorders or diseases classified elsewhere: Secondary | ICD-10-CM | POA: Diagnosis not present

## 2020-07-14 LAB — RENAL FUNCTION PANEL
Albumin: 4 g/dL (ref 3.8–4.9)
BUN/Creatinine Ratio: 9 (ref 9–20)
BUN: 9 mg/dL (ref 6–24)
CO2: 22 mmol/L (ref 20–29)
Calcium: 9.2 mg/dL (ref 8.7–10.2)
Chloride: 96 mmol/L (ref 96–106)
Creatinine, Ser: 1.04 mg/dL (ref 0.76–1.27)
Glucose: 244 mg/dL — ABNORMAL HIGH (ref 65–99)
Phosphorus: 3.9 mg/dL (ref 2.8–4.1)
Potassium: 4.4 mmol/L (ref 3.5–5.2)
Sodium: 131 mmol/L — ABNORMAL LOW (ref 134–144)
eGFR: 86 mL/min/{1.73_m2} (ref >59)

## 2020-07-14 LAB — HEMOGLOBIN A1C
Est. average glucose Bld gHb Est-mCnc: 260 mg/dL
Hgb A1c MFr Bld: 10.7 % — ABNORMAL HIGH (ref 4.8–5.6)

## 2020-07-15 DIAGNOSIS — L910 Hypertrophic scar: Secondary | ICD-10-CM | POA: Diagnosis not present

## 2020-07-15 DIAGNOSIS — L663 Perifolliculitis capitis abscedens: Secondary | ICD-10-CM | POA: Diagnosis not present

## 2020-07-21 DIAGNOSIS — F4312 Post-traumatic stress disorder, chronic: Secondary | ICD-10-CM | POA: Diagnosis not present

## 2020-07-21 DIAGNOSIS — F54 Psychological and behavioral factors associated with disorders or diseases classified elsewhere: Secondary | ICD-10-CM | POA: Diagnosis not present

## 2020-07-28 DIAGNOSIS — F54 Psychological and behavioral factors associated with disorders or diseases classified elsewhere: Secondary | ICD-10-CM | POA: Diagnosis not present

## 2020-07-28 DIAGNOSIS — F4312 Post-traumatic stress disorder, chronic: Secondary | ICD-10-CM | POA: Diagnosis not present

## 2020-08-04 DIAGNOSIS — F54 Psychological and behavioral factors associated with disorders or diseases classified elsewhere: Secondary | ICD-10-CM | POA: Diagnosis not present

## 2020-08-04 DIAGNOSIS — F4312 Post-traumatic stress disorder, chronic: Secondary | ICD-10-CM | POA: Diagnosis not present

## 2020-08-11 DIAGNOSIS — F4312 Post-traumatic stress disorder, chronic: Secondary | ICD-10-CM | POA: Diagnosis not present

## 2020-08-11 DIAGNOSIS — F54 Psychological and behavioral factors associated with disorders or diseases classified elsewhere: Secondary | ICD-10-CM | POA: Diagnosis not present

## 2020-08-18 DIAGNOSIS — F54 Psychological and behavioral factors associated with disorders or diseases classified elsewhere: Secondary | ICD-10-CM | POA: Diagnosis not present

## 2020-08-18 DIAGNOSIS — F4312 Post-traumatic stress disorder, chronic: Secondary | ICD-10-CM | POA: Diagnosis not present

## 2020-08-24 ENCOUNTER — Ambulatory Visit (INDEPENDENT_AMBULATORY_CARE_PROVIDER_SITE_OTHER): Payer: Medicare Other | Admitting: Family Medicine

## 2020-08-24 ENCOUNTER — Encounter: Payer: Self-pay | Admitting: Family Medicine

## 2020-08-24 ENCOUNTER — Other Ambulatory Visit: Payer: Self-pay

## 2020-08-24 VITALS — BP 118/62 | HR 72 | Ht 67.0 in | Wt 207.0 lb

## 2020-08-24 DIAGNOSIS — E119 Type 2 diabetes mellitus without complications: Secondary | ICD-10-CM | POA: Diagnosis not present

## 2020-08-24 NOTE — Progress Notes (Signed)
Date:  08/24/2020   Name:  Patrick Perez   DOB:  Jan 04, 1966   MRN:  [id number]   Chief Complaint: Diabetes (Follow up on new onset diabetes- avg 160)  Diabetes He presents for his follow-up diabetic visit. He has type 2 diabetes mellitus. The initial diagnosis of diabetes was made 8 weeks ago. His disease course has been stable. Pertinent negatives for hypoglycemia include no confusion, dizziness, headaches, nervousness/anxiousness or sweats. Associated symptoms include blurred vision. Pertinent negatives for diabetes include no chest pain, no fatigue, no foot paresthesias, no foot ulcerations, no polydipsia, no polyphagia, no polyuria, no visual change, no weakness and no weight loss. There are no hypoglycemic complications. Symptoms are improving. There are no diabetic complications. His weight is stable. He is following a generally healthy diet. Meal planning includes avoidance of concentrated sweets and carbohydrate counting. His breakfast blood glucose is taken between 8-9 am. His breakfast blood glucose range is generally 140-180 mg/dl.   Lab Results  Component Value Date   CREATININE 1.04 07/13/2020   BUN 9 07/13/2020   NA 131 (L) 07/13/2020   K 4.4 07/13/2020   CL 96 07/13/2020   CO2 22 07/13/2020   Lab Results  Component Value Date   CHOL 185 07/09/2020   HDL 36 07/09/2020   LDLCALC 125 07/09/2020   TRIG 126 07/09/2020   CHOLHDL 4.1 10/18/2016   Lab Results  Component Value Date   TSH 2.24 07/09/2020   Lab Results  Component Value Date   HGBA1C 10.7 (H) 07/13/2020   Lab Results  Component Value Date   WBC 6.1 07/09/2020   HGB 13.7 07/09/2020   HCT 42 07/09/2020   MCV 96.1 02/25/2019   PLT 204 07/09/2020   Lab Results  Component Value Date   ALT 19 07/09/2020   AST 20 07/09/2020   ALKPHOS 111 07/09/2020   BILITOT 2.2 (H) 02/25/2019     Review of Systems  Constitutional:  Negative for fatigue and weight loss.  Eyes:  Positive for blurred vision.   Cardiovascular:  Negative for chest pain.  Endocrine: Negative for polydipsia, polyphagia and polyuria.  Neurological:  Negative for dizziness, weakness and headaches.  Psychiatric/Behavioral:  Negative for confusion. The patient is not nervous/anxious.    Patient Active Problem List   Diagnosis Date Noted   RA (rheumatoid arthritis) (HCC) 09/13/2015   Rheumatoid arthritis (HCC) 09/13/2015   Laceration of right hand with complication 08/10/2014    No Known Allergies  Past Surgical History:  Procedure Laterality Date   TIBIA FRACTURE SURGERY Left    TONSILLECTOMY      Social History   Tobacco Use   Smoking status: Former    Packs/day: 0.50    Years: 27.00    Pack years: 13.50    Types: Cigarettes    Quit date: 04/27/2020    Years since quitting: 0.3   Smokeless tobacco: Never   Tobacco comments:    using nicorett gum  Vaping Use   Vaping Use: Never used  Substance Use Topics   Alcohol use: Yes    Alcohol/week: 2.0 standard drinks    Types: 2 Cans of beer per week   Drug use: Yes    Frequency: 7.0 times per week    Types: Marijuana    Comment: smokes once a day      Medication list has been reviewed and updated.  Current Meds  Medication Sig   cyclobenzaprine (FLEXERIL) 10 MG tablet Take 1 tablet by mouth 3 (  three) times daily. Dr Alfredo Batty   doxycycline (VIBRA-TABS) 100 MG tablet Take 1 tablet (100 mg total) by mouth 2 (two) times daily.   FLUoxetine (PROZAC) 10 MG capsule Take 1 capsule by mouth daily. Dr Keturah Barre   folic acid (FOLVITE) 1 MG tablet Take 1 mg by mouth.   gabapentin (NEURONTIN) 100 MG capsule Take 100 mg by mouth 2 (two) times daily.   gabapentin (NEURONTIN) 300 MG capsule Take 1 capsule (300 mg total) by mouth at bedtime. Dr Alfredo Batty   hydrOXYzine (VISTARIL) 25 MG capsule Take 1 capsule (25 mg total) by mouth 3 (three) times daily. (Patient taking differently: Take 25 mg by mouth 3 (three) times daily. bagey)   metFORMIN (GLUCOPHAGE) 500 MG tablet  Take 1 tablet (500 mg total) by mouth 2 (two) times daily with a meal.   mupirocin ointment (BACTROBAN) 2 % Apply 1 application topically 2 (two) times daily.   Naproxen Sodium 220 MG CAPS Aleve 220 mg capsule  prn   Omega-3 Fatty Acids (FISH OIL) 1000 MG CAPS Take 1 capsule daily   ONETOUCH ULTRA test strip 100 each daily.   QUEtiapine (SEROQUEL) 50 MG tablet Take 1 tablet (50 mg total) by mouth 3 (three) times daily. (Patient taking differently: Take 50 mg by mouth 3 (three) times daily. Bagey)   Vitamin D, Cholecalciferol, 400 units TABS Take 1 tablet by mouth 1 day or 1 dose.    PHQ 2/9 Scores 07/13/2020 10/22/2019 04/04/2019 03/01/2018  PHQ - 2 Score 0 0 3 0  PHQ- 9 Score 1 0 5 0    GAD 7 : Generalized Anxiety Score 07/13/2020 10/22/2019 04/04/2019  Nervous, Anxious, on Edge 0 1 2  Control/stop worrying 0 0 0  Worry too much - different things 0 0 0  Trouble relaxing 0 1 0  Restless 0 1 0  Easily annoyed or irritable 0 1 0  Afraid - awful might happen 0 0 0  Total GAD 7 Score 0 4 2  Anxiety Difficulty - Somewhat difficult Not difficult at all    BP Readings from Last 3 Encounters:  08/24/20 118/62  07/13/20 110/60  05/13/20 130/88    Physical Exam Vitals and nursing note reviewed.  HENT:     Head: Normocephalic.     Right Ear: External ear normal.     Left Ear: External ear normal.     Nose: Nose normal. No congestion or rhinorrhea.  Eyes:     General: No scleral icterus.       Right eye: No discharge.        Left eye: No discharge.     Conjunctiva/sclera: Conjunctivae normal.     Pupils: Pupils are equal, round, and reactive to light.  Neck:     Thyroid: No thyromegaly.     Vascular: No JVD.     Trachea: No tracheal deviation.  Cardiovascular:     Rate and Rhythm: Normal rate and regular rhythm.     Heart sounds: Normal heart sounds. No murmur heard.   No friction rub. No gallop.  Pulmonary:     Effort: No respiratory distress.     Breath sounds: Normal breath  sounds. No wheezing, rhonchi or rales.  Abdominal:     General: Bowel sounds are normal.     Palpations: Abdomen is soft. There is no mass.     Tenderness: There is no abdominal tenderness. There is no guarding or rebound.  Musculoskeletal:        General: No  tenderness. Normal range of motion.     Cervical back: Normal range of motion and neck supple.  Lymphadenopathy:     Cervical: No cervical adenopathy.  Skin:    General: Skin is warm.     Findings: No rash.  Neurological:     Mental Status: He is alert and oriented to person, place, and time.     Cranial Nerves: No cranial nerve deficit.     Deep Tendon Reflexes: Reflexes are normal and symmetric.    Wt Readings from Last 3 Encounters:  08/24/20 207 lb (93.9 kg)  07/13/20 204 lb (92.5 kg)  05/13/20 201 lb (91.2 kg)    BP 118/62   Pulse 72   Ht 5\' 7"  (1.702 m)   Wt 207 lb (93.9 kg)   BMI 32.42 kg/m   Assessment and Plan:  1. Diabetes mellitus, new onset (HCC) Relatively new onset versus acknowledgment.  Control has improved given that his fasting blood sugars are in the 1 40-1 80 range.  We will check renal function panel as well as A1c to reassess patient's efforts with metformin 500 mg twice a day and dietary approach.  Patient has been given nutritional diabetic informational material to further enhance his dietary approach. - Hemoglobin A1c - Renal Function Panel

## 2020-08-24 NOTE — Patient Instructions (Signed)
Diabetes Mellitus and Nutrition, Adult When you have diabetes, or diabetes mellitus, it is very important to have healthy eating habits because your blood sugar (glucose) levels are greatly affected by what you eat and drink. Eating healthy foods in the right amounts, at about the same times every day, can help you: Control your blood glucose. Lower your risk of heart disease. Improve your blood pressure. Reach or maintain a healthy weight. What can affect my meal plan? Every person with diabetes is different, and each person has different needs for a meal plan. Your health care provider may recommend that you work with a dietitian to make a meal plan that is best for you. Your meal plan may vary depending on factors such as: The calories you need. The medicines you take. Your weight. Your blood glucose, blood pressure, and cholesterol levels. Your activity level. Other health conditions you have, such as heart or kidney disease. How do carbohydrates affect me? Carbohydrates, also called carbs, affect your blood glucose level more than any other type of food. Eating carbs naturally raises the amount of glucose in your blood. Carb counting is a method for keeping track of how many carbs you eat. Counting carbs is important to keep your blood glucose at a healthy level,especially if you use insulin or take certain oral diabetes medicines. It is important to know how many carbs you can safely have in each meal. This is different for every person. Your dietitian can help you calculate how manycarbs you should have at each meal and for each snack. How does alcohol affect me? Alcohol can cause a sudden decrease in blood glucose (hypoglycemia), especially if you use insulin or take certain oral diabetes medicines. Hypoglycemia can be a life-threatening condition. Symptoms of hypoglycemia, such as sleepiness, dizziness, and confusion, are similar to symptoms of having too much alcohol. Do not drink  alcohol if: Your health care provider tells you not to drink. You are pregnant, may be pregnant, or are planning to become pregnant. If you drink alcohol: Do not drink on an empty stomach. Limit how much you use to: 0-1 drink a day for women. 0-2 drinks a day for men. Be aware of how much alcohol is in your drink. In the [place] one drink equals one 12 oz bottle of beer (355 mL), one 5 oz glass of wine (148 mL), or one 1 oz glass of hard liquor (44 mL). Keep yourself hydrated with water, diet soda, or unsweetened iced tea. Keep in mind that regular soda, juice, and other mixers may contain a lot of sugar and must be counted as carbs. What are tips for following this plan?  Reading food labels Start by checking the serving size on the "Nutrition Facts" label of packaged foods and drinks. The amount of calories, carbs, fats, and other nutrients listed on the label is based on one serving of the item. Many items contain more than one serving per package. Check the total grams (g) of carbs in one serving. You can calculate the number of servings of carbs in one serving by dividing the total carbs by 15. For example, if a food has 30 g of total carbs per serving, it would be equal to 2 servings of carbs. Check the number of grams (g) of saturated fats and trans fats in one serving. Choose foods that have a low amount or none of these fats. Check the number of milligrams (mg) of salt (sodium) in one serving. Most people should limit total  sodium intake to less than 2,300 mg per day. Always check the nutrition information of foods labeled as "low-fat" or "nonfat." These foods may be higher in added sugar or refined carbs and should be avoided. Talk to your dietitian to identify your daily goals for nutrients listed on the label. Shopping Avoid buying canned, pre-made, or processed foods. These foods tend to be high in fat, sodium, and added sugar. Shop around the outside edge of the grocery store. This  is where you will most often find fresh fruits and vegetables, bulk grains, fresh meats, and fresh dairy. Cooking Use low-heat cooking methods, such as baking, instead of high-heat cooking methods like deep frying. Cook using healthy oils, such as olive, canola, or sunflower oil. Avoid cooking with butter, cream, or high-fat meats. Meal planning Eat meals and snacks regularly, preferably at the same times every day. Avoid going long periods of time without eating. Eat foods that are high in fiber, such as fresh fruits, vegetables, beans, and whole grains. Talk with your dietitian about how many servings of carbs you can eat at each meal. Eat 4-6 oz (112-168 g) of lean protein each day, such as lean meat, chicken, fish, eggs, or tofu. One ounce (oz) of lean protein is equal to: 1 oz (28 g) of meat, chicken, or fish. 1 egg.  cup (62 g) of tofu. Eat some foods each day that contain healthy fats, such as avocado, nuts, seeds, and fish. What foods should I eat? Fruits Berries. Apples. Oranges. Peaches. Apricots. Plums. Grapes. Mango. Papaya.Pomegranate. Kiwi. Cherries. Vegetables Lettuce. Spinach. Leafy greens, including kale, chard, collard greens, and mustard greens. Beets. Cauliflower. Cabbage. Broccoli. Carrots. Green beans.Tomatoes. Peppers. Onions. Cucumbers. Brussels sprouts. Grains Whole grains, such as whole-wheat or whole-grain bread, crackers, tortillas,cereal, and pasta. Unsweetened oatmeal. Quinoa. Brown or wild rice. Meats and other proteins Seafood. Poultry without skin. Lean cuts of poultry and beef. Tofu. Nuts. Seeds. Dairy Low-fat or fat-free dairy products such as milk, yogurt, and cheese. The items listed above may not be a complete list of foods and beverages you can eat. Contact a dietitian for more information. What foods should I avoid? Fruits Fruits canned with syrup. Vegetables Canned vegetables. Frozen vegetables with butter or cream sauce. Grains Refined white  flour and flour products such as bread, pasta, snack foods, andcereals. Avoid all processed foods. Meats and other proteins Fatty cuts of meat. Poultry with skin. Breaded or fried meats. Processed meat.Avoid saturated fats. Dairy Full-fat yogurt, cheese, or milk. Beverages Sweetened drinks, such as soda or iced tea. The items listed above may not be a complete list of foods and beverages you should avoid. Contact a dietitian for more information. Questions to ask a health care provider Do I need to meet with a diabetes educator? Do I need to meet with a dietitian? What number can I call if I have questions? When are the best times to check my blood glucose? Where to find more information: American Diabetes Association: diabetes.org Academy of Nutrition and Dietetics: www.eatright.[employer] of Diabetes and Digestive and Kidney Diseases: [employer] Association of Diabetes Care and Education Specialists: www.diabeteseducator.org Summary It is important to have healthy eating habits because your blood sugar (glucose) levels are greatly affected by what you eat and drink. A healthy meal plan will help you control your blood glucose and maintain a healthy lifestyle. Your health care provider may recommend that you work with a dietitian to make a meal plan that is best for you. Keep in  mind that carbohydrates (carbs) and alcohol have immediate effects on your blood glucose levels. It is important to count carbs and to use alcohol carefully. This information is not intended to replace advice given to you by your health care provider. Make sure you discuss any questions you have with your healthcare provider. Document Revised: 01/07/2019 Document Reviewed: 01/07/2019 Elsevier Patient Education  2021 Elsevier Inc. GUIDELINES FOR  LOW-CHOLESTEROL, LOW-TRIGLYCERIDE DIETS    FOODS TO USE   MEATS, FISH Choose lean meats (chicken, [place], veal, and non-fatty cuts of beef with  excess fat trimmed; one serving = 3 oz of cooked meat). Also, fresh or frozen fish, canned fish packed in water, and shellfish (lobster, crabs, shrimp, and oysters). Limit use to no more than one serving of one of these per week. Shellfish are high in cholesterol but low in saturated fat and should be used sparingly. Meats and fish should be broiled (pan or oven) or baked on a rack.  EGGS Egg substitutes and egg whites (use freely). Egg yolks (limit two per week).  FRUITS Eat three servings of fresh fruit per day (1 serving =  cup). Be sure to have at least one citrus fruit daily. Frozen and canned fruit with no sugar or syrup added may be used.  VEGETABLES Most vegetables are not limited (see next page). One dark-green (string beans, escarole) or one deep yellow (squash) vegetable is recommended daily. Cauliflower, broccoli, and celery, as well as potato skins, are recommended for their fiber content. (Fiber is associated with cholesterol reduction) It is preferable to steam vegetables, but they may be boiled, strained, or braised with polyunsaturated vegetable oil (see below).  BEANS Dried peas or beans (1 serving =  cup) may be used as a bread substitute.  NUTS Almonds, walnuts, and peanuts may be used sparingly  (1 serving = 1 Tablespoonful). Use pumpkin, sesame, or sunflower seeds.  BREADS, GRAINS One roll or one slice of whole grain or enriched bread may be used, or three soda crackers or four pieces of melba toast as a substitute. Spaghetti, rice or noodles ( cup) or  large ear of corn may be used as a bread substitute. In preparing these foods do not use butter or shortening, use soft margarine. Also use egg and sugar substitutes.  Choose high fiber grains, such as oats and whole wheat.  CEREALS Use  cup of hot cereal or  cup of cold cereal per day. Add a sugar substitute if desired, with 99% fat free or skim milk.  MILK PRODUCTS Always use 99% fat free or skim milk, dairy products such as low  fat cheeses (farmer's uncreamed diet cottage), low-fat yogurt, and powdered skim milk.  FATS, OILS Use soft (not stick) margarine; vegetable oils that are high in polyunsaturated fats (such as safflower, sunflower, soybean, corn, and cottonseed). Always refrigerate meat drippings to harden the fat and remove it before preparing gravies  DESSERTS, SNACKS Limit to two servings per day; substitute each serving for a bread/cereal serving: ice milk, water sherbet (1/4 cup); unflavored gelatin or gelatin flavored with sugar substitute (1/3 cup); pudding prepared with skim milk (1/2 cup); egg white souffls; unbuttered popcorn (1  cups). Substitute carob for chocolate.  BEVERAGES Fresh fruit juices (limit 4 oz per day); black coffee, plain or herbal teas; soft drinks with sugar substitutes; club soda, preferably salt-free; cocoa made with skim milk or nonfat dried milk and water (sugar substitute added if desired); clear broth. Alcohol: limit two servings per day (see  second page).  MISCELLANEOUS  You may use the following freely: vinegar, spices, herbs, nonfat bouillon, mustard, Worcestershire sauce, soy sauce, flavoring essence.                  GUIDELINES FOR  LOW-CHOLESTEROL, LOW TRIGLYCERIDE DIETS    FOODS TO AVOID   MEATS, FISH Marbled beef, pork, bacon, sausage, and other pork products; fatty fowl (duck, goose); skin and fat of [place] and chicken; processed meats; luncheon meats (salami, bologna); frankfurters and fast-food hamburgers (theyre loaded with fat); organ meats (kidneys, liver); canned fish packed in oil.  EGGS Limit egg yolks to two per week.   FRUITS Coconuts (rich in saturated fats).  VEGETABLES Avoid avocados. Starchy vegetables (potatoes, corn, lima beans, dried peas, beans) may be used only if substitutes for a serving of bread or cereal. (Baked potato skin, however, is desirable for its fiber content.  BEANS Commercial baked beans with sugar and/or pork added.  NUTS  Avoid nuts.  Limit peanuts and walnuts to one tablespoonful per day.  BREADS, GRAINS Any baked goods with shortening and/or sugar. Commercial mixes with dried eggs and whole milk. Avoid sweet rolls, doughnuts, breakfast pastries (Danish), and sweetened packaged cereals (the added sugar converts readily to triglycerides).  MILK PRODUCTS Whole milk and whole-milk packaged goods; cream; ice cream; whole-milk puddings, yogurt, or cheeses; nondairy cream substitutes.  FATS, OILS Butter, lard, animal fats, bacon drippings, gravies, cream sauces as well as palm and coconut oils. All these are high in saturated fats. Examine labels on cholesterol free products for hydrogenated fats. (These are oils that have been hardened into solids and in the process have become saturated.)  DESSERTS, SNACKS Fried snack foods like potato chips; chocolate; candies in general; jams, jellies, syrups; whole- milk puddings; ice cream and milk sherbets; hydrogenated peanut butter.  BEVERAGES Sugared fruit juices and soft drinks; cocoa made with whole milk and/or sugar. When using alcohol (1 oz liquor, 5 oz beer, or 2  oz dry table wine per serving), one serving must be substituted for one bread or cereal serving (limit, two servings of alcohol per day).   SPECIAL NOTES    Remember that even non-limited foods should be used in moderation. While on a cholesterol-lowering diet, be sure to avoid animal fats and marbled meats. 3. While on a triglyceride-lowering diet, be sure to avoid sweets and to control the amount of carbohydrates you eat (starchy foods such as flour, bread, potatoes).While on a tri-glyceride-lowering diet, be sure to avoid sweets Buy a good low-fat cookbook, such as the one published by the American Heart Association. Consult your physician if you have any questions.               [facility] Low Glycemic Diet Plan   Low Glycemic Foods (20-49) Moderate Glycemic Foods (50-69) High Glycemic Foods  (70-100)      Breakfast Creals Breakfast Cereals Breakfast Cereals  All Bran All-Bran Fruit'n Oats   Bran Buds Bran Chex   Cheerios Corn chex    Fiber One Oatmeal (not instant)   Just Right Mini-Wheats   Corn Flakes Cream of Wheat    Oat Bran Special K Swiss Muesli   Grape Nuts Grape Nut Flakes      Grits Nutri-Grain    Fruits and fruit juice: Fruits Puffed Rice Puffed Wheat    (Limit to 1-2 Servings per day) Banana (under-ride) Dates   Rice Chex Rice Krispies    Apples Apricots (fresh/dried)   Figs Grapes  Shredded [employer]    Blackberries Blueberries   Kiwi Mango   Total     Cherries Cranberries   Oranges Raisins     Peaches Pears    Fruits  Plums Prunes   Fruit Juices Pineapple Watermelon    Grapefruit Raspberries   Cranberry Juice Orange Juice   Banana (over-ripe)     Strawberries Tangerines      Apple Juice Grapefruit Juice   Beans and Legumes Beverages  Tomato Juice    [place]-type baked beans Sodas, sweet tea, pineapple juice   Canned pinto, kidney, or navy beans   Beans and Legumes (fresh-cooked) Green peas Vegetables  Black-eyed peas Butter Beans    Potato, baked, boiled, fried, mashed  Chick peas Lentils   Vegetables French fries  Green beans Lima beans   Beets Carrots   Canned or frozen corn  Kidney beans Navy beans   Sweet potato Yam   Parsnips  Pinto beans Snow peas   Corn on the cob Winter squash      Non-starchy vegetables Grains Breads  Asparagus, avocado, broccoli, cabbage Cornmeal Rice, brown   Most breads (white and whole grain)  cauliflower, celery, cucumber, greens Rice, white Couscous   Bagels Bread sticks    lettuce, mushrooms, peppers, tomatoes  Bread stuffing Kaiser roll    okra, onions, spinach, summer squash Pasta Dinner rolls   [employer], cheese     Grains Ravioli, meat filled Spaghetti, white   Grains  Barley Bulgur    Rice, instant Tapioca, with milk    Rye Wild rice   Nuts    Cashews Macadamia    Candy and most cookies  Nuts and oils    Almonds, peanuts, sunflower seeds Snacks Snacks  hazelnuts, pecans, walnuts Chocolate Ice cream, lowfat   Donuts Corn chips    Oils that are liquid at room temperature Muffin Popcorn   Jelly beans Pretzels      Pastries  Dairy, fish, meat, soy, and eggs    Milk, skim Lowfat cheese    Restaurant and ethnic foods  Yogurt, lowfat, fruit sugar sweetened  Most [place] food (sugar in stir fry    or wok sauce)  Lean red meat Fish    Teriyaki-style meats and vegetables  Skinless chicken and [place], shellfish        Egg whites (up to 3 daily), Soy Products    Egg yolks (up to 7 or _____ per week)

## 2020-08-25 ENCOUNTER — Other Ambulatory Visit: Payer: Self-pay

## 2020-08-25 DIAGNOSIS — F4312 Post-traumatic stress disorder, chronic: Secondary | ICD-10-CM | POA: Diagnosis not present

## 2020-08-25 DIAGNOSIS — F54 Psychological and behavioral factors associated with disorders or diseases classified elsewhere: Secondary | ICD-10-CM | POA: Diagnosis not present

## 2020-08-25 DIAGNOSIS — E119 Type 2 diabetes mellitus without complications: Secondary | ICD-10-CM

## 2020-08-25 LAB — RENAL FUNCTION PANEL
Albumin: 4.4 g/dL (ref 3.8–4.9)
BUN/Creatinine Ratio: 11 (ref 9–20)
BUN: 11 mg/dL (ref 6–24)
CO2: 23 mmol/L (ref 20–29)
Calcium: 9.6 mg/dL (ref 8.7–10.2)
Chloride: 99 mmol/L (ref 96–106)
Creatinine, Ser: 1.02 mg/dL (ref 0.76–1.27)
Glucose: 149 mg/dL — ABNORMAL HIGH (ref 65–99)
Phosphorus: 4.3 mg/dL — ABNORMAL HIGH (ref 2.8–4.1)
Potassium: 4.5 mmol/L (ref 3.5–5.2)
Sodium: 137 mmol/L (ref 134–144)
eGFR: 88 mL/min/{1.73_m2} (ref >59)

## 2020-08-25 LAB — HEMOGLOBIN A1C
Est. average glucose Bld gHb Est-mCnc: 223 mg/dL
Hgb A1c MFr Bld: 9.4 % — ABNORMAL HIGH (ref 4.8–5.6)

## 2020-08-25 MED ORDER — GLIPIZIDE ER 5 MG PO TB24
5.0000 mg | ORAL_TABLET | Freq: Every day | ORAL | 1 refills | Status: DC
Start: 2020-08-25 — End: 2021-02-16

## 2020-08-25 NOTE — Progress Notes (Signed)
Sent in glipizide xl 5 mg

## 2020-08-26 DIAGNOSIS — L663 Perifolliculitis capitis abscedens: Secondary | ICD-10-CM | POA: Diagnosis not present

## 2020-09-01 DIAGNOSIS — F54 Psychological and behavioral factors associated with disorders or diseases classified elsewhere: Secondary | ICD-10-CM | POA: Diagnosis not present

## 2020-09-01 DIAGNOSIS — F4312 Post-traumatic stress disorder, chronic: Secondary | ICD-10-CM | POA: Diagnosis not present

## 2020-09-04 ENCOUNTER — Other Ambulatory Visit: Payer: Self-pay | Admitting: Family Medicine

## 2020-09-04 DIAGNOSIS — E119 Type 2 diabetes mellitus without complications: Secondary | ICD-10-CM

## 2020-09-04 NOTE — Telephone Encounter (Signed)
Requested Prescriptions  Pending Prescriptions Disp Refills  . metFORMIN (GLUCOPHAGE) 500 MG tablet [Pharmacy Med Name: METFORMIN HCL 500 MG TABLET] 180 tablet 0    Sig: TAKE 1 TABLET BY MOUTH 2 TIMES DAILY WITH A MEAL.     Endocrinology:  Diabetes - Biguanides Failed - 09/04/2020  9:52 AM      Failed - HBA1C is between 0 and 7.9 and within 180 days    Hemoglobin A1C  Date Value Ref Range Status  07/09/2020 10.9  Final   Hgb A1c MFr Bld  Date Value Ref Range Status  08/24/2020 9.4 (H) 4.8 - 5.6 % Final    Comment:             Prediabetes: 5.7 - 6.4          Diabetes: >6.4          Glycemic control for adults with diabetes: <7.0          Passed - Cr in normal range and within 360 days    Creatinine  Date Value Ref Range Status  10/29/2011 0.96 0.60 - 1.30 mg/dL Final   Creatinine, Ser  Date Value Ref Range Status  08/24/2020 1.02 0.76 - 1.27 mg/dL Final         Passed - eGFR in normal range and within 360 days    EGFR (African American)  Date Value Ref Range Status  10/29/2011 >60  Final   GFR calc Af Amer  Date Value Ref Range Status  02/25/2019 >60 >60 mL/min Final   EGFR (Non-African Amer.)  Date Value Ref Range Status  10/29/2011 >60  Final    Comment:    eGFR values <15m/min/1.73 m2 may be an indication of chronic kidney disease (CKD). Calculated eGFR is useful in patients with stable renal function. The eGFR calculation will not be reliable in acutely ill patients when serum creatinine is changing rapidly. It is not useful in  patients on dialysis. The eGFR calculation may not be applicable to patients at the low and high extremes of body sizes, pregnant women, and vegetarians.    GFR calc non Af Amer  Date Value Ref Range Status  02/25/2019 >60 >60 mL/min Final   eGFR  Date Value Ref Range Status  08/24/2020 88 >59 mL/min/1.73 Final         Passed - Valid encounter within last 6 months    Recent Outpatient Visits          1 week ago [place]   [facility]   1 month ago Hypergly[facility]   3 months ago PTSD (post-traumatic stress disorder)   [facility]   10 months ago [facility]ial abscess   [facility]   1 year ago Genital candidiasis in male   MThayer MD      Future Appointments            In 3 months [facility]

## 2020-09-08 DIAGNOSIS — F54 Psychological and behavioral factors associated with disorders or diseases classified elsewhere: Secondary | ICD-10-CM | POA: Diagnosis not present

## 2020-09-08 DIAGNOSIS — F4312 Post-traumatic stress disorder, chronic: Secondary | ICD-10-CM | POA: Diagnosis not present

## 2020-09-12 ENCOUNTER — Other Ambulatory Visit: Payer: Self-pay | Admitting: Family Medicine

## 2020-09-12 NOTE — Telephone Encounter (Signed)
Requested medication (s) are due for refill today: -  Requested medication (s) are on the active medication list: historical med  Last refill:  08/24/20  Future visit scheduled: yes  Notes to clinic:  historical med and provider   Requested Prescriptions  Pending Prescriptions Disp Refills   ONETOUCH ULTRA test strip [Pharmacy Med Name: ONE TOUCH ULTRA BLUE TEST STRP] 50 strip 1    Sig: USE TO TEST ONCE DAILY      Endocrinology: Diabetes - Testing Supplies Passed - 09/12/2020 12:05 PM      Passed - Valid encounter within last 12 months    Recent Outpatient Visits           2 weeks ago Diabetes mellitus, new onset (HCC)   [facility] Duanne Limerick, MD   2 months ago Hyperglycemia   [facility] Duanne Limerick, MD   4 months ago PTSD (post-traumatic stress disorder)   [facility] Duanne Limerick, MD   10 months ago Facial abscess   [facility] Duanne Limerick, MD   1 year ago Genital candidiasis in male   [facility] Duanne Limerick, MD       Future Appointments             In 3 months Duanne Limerick, MD [facility], [facility]

## 2020-09-13 ENCOUNTER — Other Ambulatory Visit: Payer: Self-pay | Admitting: Family Medicine

## 2020-09-14 DIAGNOSIS — Z79899 Other long term (current) drug therapy: Secondary | ICD-10-CM | POA: Diagnosis not present

## 2020-09-14 DIAGNOSIS — M0579 Rheumatoid arthritis with rheumatoid factor of multiple sites without organ or systems involvement: Secondary | ICD-10-CM | POA: Diagnosis not present

## 2020-09-14 DIAGNOSIS — M159 Polyosteoarthritis, unspecified: Secondary | ICD-10-CM | POA: Diagnosis not present

## 2020-09-14 DIAGNOSIS — Z111 Encounter for screening for respiratory tuberculosis: Secondary | ICD-10-CM | POA: Diagnosis not present

## 2020-09-14 DIAGNOSIS — M069 Rheumatoid arthritis, unspecified: Secondary | ICD-10-CM | POA: Diagnosis not present

## 2020-09-14 DIAGNOSIS — G2581 Restless legs syndrome: Secondary | ICD-10-CM | POA: Diagnosis not present

## 2020-09-15 DIAGNOSIS — F54 Psychological and behavioral factors associated with disorders or diseases classified elsewhere: Secondary | ICD-10-CM | POA: Diagnosis not present

## 2020-09-15 DIAGNOSIS — F4312 Post-traumatic stress disorder, chronic: Secondary | ICD-10-CM | POA: Diagnosis not present

## 2020-09-18 ENCOUNTER — Other Ambulatory Visit: Payer: Self-pay | Admitting: Family Medicine

## 2020-09-18 DIAGNOSIS — E119 Type 2 diabetes mellitus without complications: Secondary | ICD-10-CM

## 2020-09-18 NOTE — Telephone Encounter (Signed)
last RF 08/25/20 #30 1 RF

## 2020-09-22 DIAGNOSIS — F4312 Post-traumatic stress disorder, chronic: Secondary | ICD-10-CM | POA: Diagnosis not present

## 2020-09-22 DIAGNOSIS — F54 Psychological and behavioral factors associated with disorders or diseases classified elsewhere: Secondary | ICD-10-CM | POA: Diagnosis not present

## 2020-09-27 DIAGNOSIS — F4312 Post-traumatic stress disorder, chronic: Secondary | ICD-10-CM | POA: Diagnosis not present

## 2020-09-27 DIAGNOSIS — F411 Generalized anxiety disorder: Secondary | ICD-10-CM | POA: Diagnosis not present

## 2020-09-29 DIAGNOSIS — F54 Psychological and behavioral factors associated with disorders or diseases classified elsewhere: Secondary | ICD-10-CM | POA: Diagnosis not present

## 2020-09-29 DIAGNOSIS — F4312 Post-traumatic stress disorder, chronic: Secondary | ICD-10-CM | POA: Diagnosis not present

## 2020-10-06 DIAGNOSIS — F4312 Post-traumatic stress disorder, chronic: Secondary | ICD-10-CM | POA: Diagnosis not present

## 2020-10-06 DIAGNOSIS — F54 Psychological and behavioral factors associated with disorders or diseases classified elsewhere: Secondary | ICD-10-CM | POA: Diagnosis not present

## 2020-10-13 DIAGNOSIS — F4312 Post-traumatic stress disorder, chronic: Secondary | ICD-10-CM | POA: Diagnosis not present

## 2020-10-13 DIAGNOSIS — F54 Psychological and behavioral factors associated with disorders or diseases classified elsewhere: Secondary | ICD-10-CM | POA: Diagnosis not present

## 2020-10-15 ENCOUNTER — Other Ambulatory Visit: Payer: Self-pay | Admitting: Family Medicine

## 2020-10-15 NOTE — Telephone Encounter (Signed)
Requested medication (s) are due for refill today: see encounter   Requested medication (s) are on the active medication list: no  Last refill:  na  Future visit scheduled: yes in 2 months   Notes to clinic:  request for One touch Delica 33 G lancets. Not on medication profile . Do you want to order lancets?     Requested Prescriptions  Pending Prescriptions Disp Refills   OneTouch Delica Lancets 33G MISC [Pharmacy Med Name: ONE TOUCH DELICA 33G LANCETS] 100 each     Sig: USE TO TEST ONCE DAILY     Endocrinology: Diabetes - Testing Supplies Passed - 10/15/2020 10:13 AM      Passed - Valid encounter within last 12 months    Recent Outpatient Visits           1 month ago Diabetes mellitus, new onset (HCC)   [facility] Duanne Limerick, MD   3 months ago Hyperglycemia   [facility] Duanne Limerick, MD   5 months ago PTSD (post-traumatic stress disorder)   [facility] Duanne Limerick, MD   11 months ago Facial abscess   [facility] Duanne Limerick, MD   1 year ago Genital candidiasis in male   [facility] Duanne Limerick, MD       Future Appointments             In 2 months Duanne Limerick, MD [facility], [facility]

## 2020-10-20 DIAGNOSIS — F4312 Post-traumatic stress disorder, chronic: Secondary | ICD-10-CM | POA: Diagnosis not present

## 2020-10-20 DIAGNOSIS — F54 Psychological and behavioral factors associated with disorders or diseases classified elsewhere: Secondary | ICD-10-CM | POA: Diagnosis not present

## 2020-10-22 DIAGNOSIS — Z23 Encounter for immunization: Secondary | ICD-10-CM | POA: Diagnosis not present

## 2020-10-27 DIAGNOSIS — F54 Psychological and behavioral factors associated with disorders or diseases classified elsewhere: Secondary | ICD-10-CM | POA: Diagnosis not present

## 2020-10-27 DIAGNOSIS — F4312 Post-traumatic stress disorder, chronic: Secondary | ICD-10-CM | POA: Diagnosis not present

## 2020-11-10 DIAGNOSIS — F4312 Post-traumatic stress disorder, chronic: Secondary | ICD-10-CM | POA: Diagnosis not present

## 2020-11-10 DIAGNOSIS — F54 Psychological and behavioral factors associated with disorders or diseases classified elsewhere: Secondary | ICD-10-CM | POA: Diagnosis not present

## 2020-11-22 DIAGNOSIS — Z23 Encounter for immunization: Secondary | ICD-10-CM | POA: Diagnosis not present

## 2020-12-03 ENCOUNTER — Other Ambulatory Visit: Payer: Self-pay | Admitting: Family Medicine

## 2020-12-03 DIAGNOSIS — E119 Type 2 diabetes mellitus without complications: Secondary | ICD-10-CM

## 2020-12-04 NOTE — Telephone Encounter (Signed)
Requested Prescriptions  Pending Prescriptions Disp Refills  . metFORMIN (GLUCOPHAGE) 500 MG tablet [Pharmacy Med Name: METFORMIN HCL 500 MG TABLET] 180 tablet 0    Sig: TAKE 1 TABLET BY MOUTH 2 TIMES DAILY WITH A MEAL.     Endocrinology:  Diabetes - Biguanides Failed - 12/03/2020  2:30 PM      Failed - HBA1C is between 0 and 7.9 and within 180 days    Hemoglobin A1C  Date Value Ref Range Status  07/09/2020 10.9  Final   Hgb A1c MFr Bld  Date Value Ref Range Status  08/24/2020 9.4 (H) 4.8 - 5.6 % Final    Comment:             Prediabetes: 5.7 - 6.4          Diabetes: >6.4          Glycemic control for adults with diabetes: <7.0          Passed - Cr in normal range and within 360 days    Creatinine  Date Value Ref Range Status  10/29/2011 0.96 0.60 - 1.30 mg/dL Final   Creatinine, Ser  Date Value Ref Range Status  08/24/2020 1.02 0.76 - 1.27 mg/dL Final         Passed - eGFR in normal range and within 360 days    EGFR (African American)  Date Value Ref Range Status  10/29/2011 >60  Final   GFR calc Af Amer  Date Value Ref Range Status  02/25/2019 >60 >60 mL/min Final   EGFR (Non-African Amer.)  Date Value Ref Range Status  10/29/2011 >60  Final    Comment:    eGFR values <35m/min/1.73 m2 may be an indication of chronic kidney disease (CKD). Calculated eGFR is useful in patients with stable renal function. The eGFR calculation will not be reliable in acutely ill patients when serum creatinine is changing rapidly. It is not useful in  patients on dialysis. The eGFR calculation may not be applicable to patients at the low and high extremes of body sizes, pregnant women, and vegetarians.    GFR calc non Af Amer  Date Value Ref Range Status  02/25/2019 >60 >60 mL/min Final   eGFR  Date Value Ref Range Status  08/24/2020 88 >59 mL/min/1.73 Final         Passed - Valid encounter within last 6 months    Recent Outpatient Visits          3 months ago Diabetes  m[place]itus, [place]   [facility]   4 months ago Hyperglycemia   [facility]   6 months ago PTSD (post-traumatic stress disorder)   [facility]   1 year ago Facial abscess   [facility]   1 year ago Genital candidiasis in male   MSilver Plume MD      Future Appointments            In 3 weeks [facility]          \

## 2020-12-15 DIAGNOSIS — F54 Psychological and behavioral factors associated with disorders or diseases classified elsewhere: Secondary | ICD-10-CM | POA: Diagnosis not present

## 2020-12-15 DIAGNOSIS — F4312 Post-traumatic stress disorder, chronic: Secondary | ICD-10-CM | POA: Diagnosis not present

## 2020-12-28 ENCOUNTER — Ambulatory Visit: Payer: Medicare Other | Admitting: Family Medicine

## 2021-01-05 DIAGNOSIS — F4312 Post-traumatic stress disorder, chronic: Secondary | ICD-10-CM | POA: Diagnosis not present

## 2021-01-05 DIAGNOSIS — F54 Psychological and behavioral factors associated with disorders or diseases classified elsewhere: Secondary | ICD-10-CM | POA: Diagnosis not present

## 2021-01-12 DIAGNOSIS — F4312 Post-traumatic stress disorder, chronic: Secondary | ICD-10-CM | POA: Diagnosis not present

## 2021-01-12 DIAGNOSIS — F54 Psychological and behavioral factors associated with disorders or diseases classified elsewhere: Secondary | ICD-10-CM | POA: Diagnosis not present

## 2021-01-19 DIAGNOSIS — F54 Psychological and behavioral factors associated with disorders or diseases classified elsewhere: Secondary | ICD-10-CM | POA: Diagnosis not present

## 2021-01-19 DIAGNOSIS — F4312 Post-traumatic stress disorder, chronic: Secondary | ICD-10-CM | POA: Diagnosis not present

## 2021-01-26 DIAGNOSIS — F4312 Post-traumatic stress disorder, chronic: Secondary | ICD-10-CM | POA: Diagnosis not present

## 2021-01-26 DIAGNOSIS — F54 Psychological and behavioral factors associated with disorders or diseases classified elsewhere: Secondary | ICD-10-CM | POA: Diagnosis not present

## 2021-02-13 ENCOUNTER — Encounter: Payer: Self-pay | Admitting: Emergency Medicine

## 2021-02-13 ENCOUNTER — Emergency Department
Admission: EM | Admit: 2021-02-13 | Discharge: 2021-02-13 | Disposition: A | Discharge status: Home / Self Care | Payer: Medicare Other | Attending: Emergency Medicine | Admitting: Emergency Medicine

## 2021-02-13 ENCOUNTER — Other Ambulatory Visit: Payer: Self-pay

## 2021-02-13 ENCOUNTER — Emergency Department: Payer: Medicare Other

## 2021-02-13 DIAGNOSIS — M898X1 Other specified disorders of bone, shoulder: Secondary | ICD-10-CM

## 2021-02-13 DIAGNOSIS — T148XXA Other injury of unspecified body region, initial encounter: Secondary | ICD-10-CM

## 2021-02-13 DIAGNOSIS — S46911A Strain of unspecified muscle, fascia and tendon at shoulder and upper arm level, right arm, initial encounter: Secondary | ICD-10-CM | POA: Diagnosis not present

## 2021-02-13 DIAGNOSIS — X500XXA Overexertion from strenuous movement or load, initial encounter: Secondary | ICD-10-CM | POA: Diagnosis not present

## 2021-02-13 DIAGNOSIS — S4991XA Unspecified injury of right shoulder and upper arm, initial encounter: Secondary | ICD-10-CM | POA: Diagnosis not present

## 2021-02-13 DIAGNOSIS — M6210 Other rupture of muscle (nontraumatic), unspecified site: Secondary | ICD-10-CM | POA: Diagnosis not present

## 2021-02-13 DIAGNOSIS — M25511 Pain in right shoulder: Secondary | ICD-10-CM | POA: Diagnosis not present

## 2021-02-13 MED ORDER — HYDROCODONE-ACETAMINOPHEN 5-325 MG PO TABS: 1.0000 | ORAL_TABLET | Freq: Once | ORAL | Status: DC

## 2021-02-13 MED ORDER — NAPROXEN 500 MG PO TABS: 500.0000 mg | ORAL_TABLET | Freq: Once | ORAL | Status: DC

## 2021-02-13 MED ORDER — ACETAMINOPHEN 325 MG PO TABS: 650.0000 mg | ORAL_TABLET | Freq: Once | ORAL | Status: DC

## 2021-02-13 MED ORDER — HYDROCODONE-ACETAMINOPHEN 5-325 MG PO TABS
1.0000 | ORAL_TABLET | Freq: Four times a day (QID) | ORAL | 0 refills | Status: AC | PRN
Start: 2021-02-13 — End: 2021-02-18

## 2021-02-13 NOTE — ED Triage Notes (Signed)
Pt reports was helping son move something last pm and he dropped his side and when he was trying to hold up other side it pulled his right shoulder. Pt c/o pain to shoulder since then and states not able to lift right arm at all.

## 2021-02-13 NOTE — ED Provider Notes (Signed)
[facility] Provider Note    Event Date/Time   First MD Initiated Contact with Patient 02/13/21 1240     (approximate)   History   Shoulder Pain   HPI  Patrick Perez is a 56 y.o. male with a past medical history of RA and diabetes who presents for assessment of right-sided clavicle pain.  Patient states he was helping family move some furniture when it fell and he used his shoulder to push into a piece of furniture.  He states he felt immediate pain around the right clavicle and not as much around the shoulder as it was initially reported to triage.  States he has not been able to range it fully.  But denies any other acute complaints including any headache, neck pain, other right upper extremity pain, any left upper extremity pain prior similar episodes.  Denies any other concerns at this time.      Physical Exam  Triage Vital Signs: ED Triage Vitals  Enc Vitals Group     BP 02/13/21 1118 (!) 149/107     Pulse Rate 02/13/21 1118 (!) 119     Resp 02/13/21 1118 20     Temp 02/13/21 1118 98.5 F (36.9 C)     Temp Source 02/13/21 1118 Oral     SpO2 02/13/21 1118 95 %     Weight 02/13/21 1058 200 lb (90.7 kg)     Height 02/13/21 1058 5\' 8"  (1.727 m)     Head Circumference --      Peak Flow --      Pain Score 02/13/21 1058 10     Pain Loc --      Pain Edu? --      Excl. in GC? --     Most recent vital signs: Vitals:   02/13/21 1118  BP: (!) 149/107  Pulse: (!) 119  Resp: 20  Temp: 98.5 F (36.9 C)  SpO2: 95%     General: Awake, no distress.  CV:  Good peripheral perfusion.  Resp:  Normal effort.  Abd:  No distention.  Other:  Significant limitation range of motion at the right shoulder although there is no specific point tenderness effusion or edema.  There is tenderness along the medial aspect of the clavicle.  There is no deformity or overlying skin changes.  Patient has sensation intact in the distribution of the radial ulnar and median  nerves in the right upper extremity.  2+ bilateral radial pulses.  He otherwise has full strength at the right elbow and wrist.  Neck and remainder of chest are unremarkable.  Sternum is nontender.   ED Results / Procedures / Treatments  Labs (all labs ordered are listed, but only abnormal results are displayed) Labs Reviewed - No data to display   EKG    RADIOLOGY I reviewed patient's radiology report and agree with findings.  On my own interpretation I do not see any evidence of acute fracture, dislocation and AC joints appear maintained appropriately.  No significant calcifications and ribs are unremarkable.  Sternum is unremarkable.  Right clavicle is visualized and shows no clear fracture or dislocation.    PROCEDURES:  Critical Care performed: No  Procedures     MEDICATIONS ORDERED IN ED: Medications  HYDROcodone-acetaminophen (NORCO/VICODIN) 5-325 MG per tablet 1 tablet (has no administration in time range)  acetaminophen (TYLENOL) tablet 650 mg (has no administration in time range)  naproxen (NAPROSYN) tablet 500 mg (has no administration in time range)  IMPRESSION / MDM / ASSESSMENT AND PLAN / ED COURSE  I reviewed the triage vital signs and the nursing notes.                              Differential diagnosis includes, but is not limited to, contusion, muscle strain and possible clavicle fracture.  X-ray was reviewed and shows no clavicle fracture or acute other bony abnormality of the ribs or shoulder joint.  Patient has some limitation range of motion at the right shoulder secondary to pain which he states is coming from his clavicle.  Given I see no evidence of fracture on imaging I suspect likely muscle tear possibly causing some nerve irritation as well.  I have a low suspicion for other significant visceral injury given patient is otherwise neurovascular intact I think is stable for discharge with outpatient management.  Will write for short course of  Norco and provide him 1 tablet of this as well as Tylenol and naproxen the emergency room.  Will place in sling for comfort.  Advised on appropriate range of motion and using the right upper extremity as tolerated with pain medication to prevent stiffness from sitting in.  Discharged in stable condition with plan to follow-up with PCP.  Medications  HYDROcodone-acetaminophen (NORCO/VICODIN) 5-325 MG per tablet 1 tablet (has no administration in time range)  acetaminophen (TYLENOL) tablet 650 mg (has no administration in time range)  naproxen (NAPROSYN) tablet 500 mg (has no administration in time range)        FINAL CLINICAL IMPRESSION(S) / ED DIAGNOSES   Final diagnoses:  Pain of right clavicle  Muscle tear     Rx / DC Orders   ED Discharge Orders          Ordered    HYDROcodone-acetaminophen (NORCO) 5-325 MG tablet  Every 6 hours PRN        02/13/21 1256             Note:  This document was prepared using Dragon voice recognition software and may include unintentional dictation errors.   Gilles Chiquito, MD 02/13/21 1257

## 2021-02-16 ENCOUNTER — Encounter: Payer: Self-pay | Admitting: Family Medicine

## 2021-02-16 ENCOUNTER — Ambulatory Visit (INDEPENDENT_AMBULATORY_CARE_PROVIDER_SITE_OTHER): Payer: Medicare Other | Admitting: Family Medicine

## 2021-02-16 VITALS — BP 142/96 | HR 103 | Ht 68.0 in | Wt 201.0 lb

## 2021-02-16 DIAGNOSIS — B356 Tinea cruris: Secondary | ICD-10-CM

## 2021-02-16 DIAGNOSIS — E119 Type 2 diabetes mellitus without complications: Secondary | ICD-10-CM

## 2021-02-16 DIAGNOSIS — F4312 Post-traumatic stress disorder, chronic: Secondary | ICD-10-CM | POA: Diagnosis not present

## 2021-02-16 DIAGNOSIS — F54 Psychological and behavioral factors associated with disorders or diseases classified elsewhere: Secondary | ICD-10-CM | POA: Diagnosis not present

## 2021-02-16 MED ORDER — GLIPIZIDE ER 5 MG PO TB24: 5.0000 mg | ORAL_TABLET | Freq: Every day | ORAL | 1 refills | Status: DC

## 2021-02-16 MED ORDER — CLOTRIMAZOLE-BETAMETHASONE 1-0.05 % EX CREA: 1.0000 "application " | TOPICAL_CREAM | Freq: Every day | CUTANEOUS | 0 refills | Status: DC

## 2021-02-16 MED ORDER — ONETOUCH DELICA LANCETS 33G MISC: 0 refills | Status: AC

## 2021-02-16 MED ORDER — METFORMIN HCL 500 MG PO TABS: 500.0000 mg | ORAL_TABLET | Freq: Two times a day (BID) | ORAL | 1 refills | Status: DC

## 2021-02-16 NOTE — Progress Notes (Addendum)
Date:  02/16/2021   Name:  Patrick Perez   DOB:  30-Jan-1966   MRN:  [id number]   Chief Complaint: Diabetes (Running between 180 and 200)  Diabetes He presents for his follow-up diabetic visit. He has type 2 diabetes mellitus. His disease course has been stable. There are no hypoglycemic associated symptoms. Pertinent negatives for hypoglycemia include no dizziness, headaches or nervousness/anxiousness. There are no diabetic associated symptoms. Pertinent negatives for diabetes include no blurred vision, no chest pain, no fatigue, no foot paresthesias, no foot ulcerations, no polydipsia, no polyphagia, no polyuria, no visual change, no weakness and no weight loss. There are no hypoglycemic complications. Symptoms are stable. There are no diabetic complications. There are no known risk factors for coronary artery disease. Current diabetic treatment includes oral agent (dual therapy). He is compliant with treatment all of the time. He is following a generally healthy diet. Meal planning includes avoidance of concentrated sweets and carbohydrate counting. He participates in exercise intermittently. His breakfast blood glucose is taken between 8-9 am. His breakfast blood glucose range is generally 180-200 mg/dl.   Lab Results  Component Value Date   NA 137 08/24/2020   K 4.5 08/24/2020   CO2 23 08/24/2020   GLUCOSE 149 (H) 08/24/2020   BUN 11 08/24/2020   CREATININE 1.02 08/24/2020   CALCIUM 9.6 08/24/2020   EGFR 88 08/24/2020   GFRNONAA >60 02/25/2019   Lab Results  Component Value Date   CHOL 185 07/09/2020   HDL 36 07/09/2020   LDLCALC 125 07/09/2020   TRIG 126 07/09/2020   CHOLHDL 4.1 10/18/2016   Lab Results  Component Value Date   TSH 2.24 07/09/2020   Lab Results  Component Value Date   HGBA1C 9.4 (H) 08/24/2020   Lab Results  Component Value Date   WBC 6.1 07/09/2020   HGB 13.7 07/09/2020   HCT 42 07/09/2020   MCV 96.1 02/25/2019   PLT 204 07/09/2020   Lab Results   Component Value Date   ALT 19 07/09/2020   AST 20 07/09/2020   ALKPHOS 111 07/09/2020   BILITOT 2.2 (H) 02/25/2019   No results found for: 25OHVITD2, 25OHVITD3, VD25OH   Review of Systems  Constitutional:  Negative for chills, fatigue, fever and weight loss.  HENT:  Negative for drooling, ear discharge, ear pain and sore throat.   Eyes:  Negative for blurred vision.  Respiratory:  Negative for cough, shortness of breath and wheezing.   Cardiovascular:  Negative for chest pain, palpitations and leg swelling.  Gastrointestinal:  Negative for abdominal pain, blood in stool, constipation, diarrhea and nausea.  Endocrine: Negative for polydipsia, polyphagia and polyuria.  Genitourinary:  Negative for dysuria, frequency, hematuria and urgency.  Musculoskeletal:  Negative for back pain, myalgias and neck pain.  Skin:  Negative for rash.  Allergic/Immunologic: Negative for environmental allergies.  Neurological:  Negative for dizziness, weakness and headaches.  Hematological:  Does not bruise/bleed easily.  Psychiatric/Behavioral:  Negative for suicidal ideas. The patient is not nervous/anxious.    Patient Active Problem List   Diagnosis Date Noted   RA (rheumatoid arthritis) ([place]) 09/13/2015   Rheumatoid arthritis ([place]) 09/13/2015   Laceration of right hand with complication 17/61/6073    No Known Allergies  Past Surgical History:  Procedure Laterality Date   TIBIA FRACTURE SURGERY Left    TONSILLECTOMY      Social History   Tobacco Use   Smoking status: Former    Packs/day: 0.50    Years:  27.00    Pack years: 13.50    Types: Cigarettes    Quit date: 04/27/2020    Years since quitting: 0.8   Smokeless tobacco: Never   Tobacco comments:    using nicorett gum  Vaping Use   Vaping Use: Never used  Substance Use Topics   Alcohol use: Yes    Alcohol/week: 2.0 standard drinks    Types: 2 Cans of beer per week   Drug use: Yes    Frequency: 7.0 times per week     Types: Marijuana    Comment: smokes once a day      Medication list has been reviewed and updated.  Current Meds  Medication Sig   folic acid (FOLVITE) 1 MG tablet Take 1 mg by mouth.   gabapentin (NEURONTIN) 100 MG capsule Take 100 mg by mouth 2 (two) times daily.   glipiZIDE (GLIPIZIDE XL) 5 MG 24 hr tablet Take 1 tablet (5 mg total) by mouth daily with breakfast.   HYDROcodone-acetaminophen (NORCO) 5-325 MG tablet Take 1-2 tablets by mouth every 6 (six) hours as needed for up to 5 days for moderate pain or severe pain.   hydrOXYzine (VISTARIL) 25 MG capsule Take 1 capsule (25 mg total) by mouth 3 (three) times daily. (Patient taking differently: Take 25 mg by mouth 3 (three) times daily. bagey)   metFORMIN (GLUCOPHAGE) 500 MG tablet TAKE 1 TABLET BY MOUTH 2 TIMES DAILY WITH A MEAL.   mupirocin ointment (BACTROBAN) 2 % Apply 1 application topically 2 (two) times daily.   Naproxen Sodium 220 MG CAPS Aleve 220 mg capsule  prn   Omega-3 Fatty Acids (FISH OIL) 1000 MG CAPS Take 1 capsule daily   OneTouch Delica Lancets 85Y MISC USE TO TEST ONCE DAILY   ONETOUCH ULTRA test strip USE TO TEST ONCE DAILY   Vitamin D, Cholecalciferol, 400 units TABS Take 1 tablet by mouth 1 day or 1 dose.    PHQ 2/9 Scores 02/16/2021 07/13/2020 10/22/2019 04/04/2019  PHQ - 2 Score 0 0 0 3  PHQ- 9 Score 0 1 0 5    GAD 7 : Generalized Anxiety Score 02/16/2021 07/13/2020 10/22/2019 04/04/2019  Nervous, Anxious, on Edge 0 0 1 2  Control/stop worrying 0 0 0 0  Worry too much - different things 0 0 0 0  Trouble relaxing 0 0 1 0  Restless 0 0 1 0  Easily annoyed or irritable 0 0 1 0  Afraid - awful might happen 0 0 0 0  Total GAD 7 Score 0 0 4 2  Anxiety Difficulty - - Somewhat difficult Not difficult at all    BP Readings from Last 3 Encounters:  02/16/21 (!) 142/96  02/13/21 (!) 149/107  08/24/20 118/62    Physical Exam Vitals and nursing note reviewed.  HENT:     Head: Normocephalic.      Right Ear: Tympanic membrane and external ear normal.     Left Ear: Tympanic membrane and external ear normal.     Nose: Nose normal.  Eyes:     General: No scleral icterus.       Right eye: No discharge.        Left eye: No discharge.     Conjunctiva/sclera: Conjunctivae normal.     Pupils: Pupils are equal, round, and reactive to light.  Neck:     Thyroid: No thyromegaly.     Vascular: No JVD.     Trachea: No tracheal deviation.  Cardiovascular:  Rate and Rhythm: Normal rate and regular rhythm.     Heart sounds: Normal heart sounds. No murmur heard.   No friction rub. No gallop.  Pulmonary:     Effort: No respiratory distress.     Breath sounds: Normal breath sounds. No wheezing or rales.  Abdominal:     General: Bowel sounds are normal.     Palpations: Abdomen is soft. There is no mass.     Tenderness: There is no abdominal tenderness. There is no guarding or rebound.  Musculoskeletal:        General: No tenderness. Normal range of motion.     Cervical back: Normal range of motion and neck supple.  Lymphadenopathy:     Cervical: No cervical adenopathy.  Skin:    General: Skin is warm.     Findings: Erythema present. No rash.       Neurological:     Mental Status: He is alert and oriented to person, place, and time.     Cranial Nerves: No cranial nerve deficit.     Deep Tendon Reflexes: Reflexes are normal and symmetric.    Wt Readings from Last 3 Encounters:  02/16/21 201 lb (91.2 kg)  02/13/21 200 lb (90.7 kg)  08/24/20 207 lb (93.9 kg)    BP (!) 142/96    Pulse (!) 103    Ht 5' 8"  (1.727 m)    Wt 201 lb (91.2 kg)    SpO2 98%    BMI 30.56 kg/m   Assessment and Plan:  1. Diabetes mellitus, new onset ([place]) Chronic.  Uncontrolled by fasting sugars.  Currently is on glipizide 5 mg once a day and metformin 500 mg twice a day.  We will assist A1c pending that we may be increasing dosings of these. - glipiZIDE (GLIPIZIDE XL) 5 MG 24 hr tablet; Take 1 tablet (5 mg  total) by mouth daily with breakfast.  Dispense: 90 tablet; Refill: 1 - metFORMIN (GLUCOPHAGE) 500 MG tablet; Take 1 tablet (500 mg total) by mouth 2 (two) times daily with a meal.  Dispense: 180 tablet; Refill: 1 - OneTouch Delica Lancets 59B MISC; USE TO TEST ONCE DAILY  Dispense: 100 each; Refill: 0 - HgB A1c - Microalbumin, urine - Renal Function Panel  2. Tinea inguinalis Patient has mild flareup of tinea inguinalis consistent with fungal infection.  We will treat with Lotrisone cream apply twice a day for 5 to 7 days. - clotrimazole-betamethasone (LOTRISONE) cream; Apply 1 application topically daily.  Dispense: 30 g; Refill: 0

## 2021-02-17 LAB — HEMOGLOBIN A1C
Est. average glucose Bld gHb Est-mCnc: 157 mg/dL
Hgb A1c MFr Bld: 7.1 % — ABNORMAL HIGH (ref 4.8–5.6)

## 2021-02-17 LAB — RENAL FUNCTION PANEL
Albumin: 4.3 g/dL (ref 3.8–4.9)
BUN/Creatinine Ratio: 9 (ref 9–20)
BUN: 8 mg/dL (ref 6–24)
CO2: 22 mmol/L (ref 20–29)
Calcium: 9.1 mg/dL (ref 8.7–10.2)
Chloride: 99 mmol/L (ref 96–106)
Creatinine, Ser: 0.87 mg/dL (ref 0.76–1.27)
Glucose: 126 mg/dL — ABNORMAL HIGH (ref 70–99)
Phosphorus: 2.6 mg/dL — ABNORMAL LOW (ref 2.8–4.1)
Potassium: 4.5 mmol/L (ref 3.5–5.2)
Sodium: 136 mmol/L (ref 134–144)
eGFR: 103 mL/min/{1.73_m2} (ref >59)

## 2021-02-17 LAB — MICROALBUMIN, URINE: Microalbumin, Urine: 19 ug/mL

## 2021-02-23 DIAGNOSIS — F4312 Post-traumatic stress disorder, chronic: Secondary | ICD-10-CM | POA: Diagnosis not present

## 2021-02-23 DIAGNOSIS — F54 Psychological and behavioral factors associated with disorders or diseases classified elsewhere: Secondary | ICD-10-CM | POA: Diagnosis not present

## 2021-03-02 DIAGNOSIS — F54 Psychological and behavioral factors associated with disorders or diseases classified elsewhere: Secondary | ICD-10-CM | POA: Diagnosis not present

## 2021-03-02 DIAGNOSIS — F4312 Post-traumatic stress disorder, chronic: Secondary | ICD-10-CM | POA: Diagnosis not present

## 2021-03-09 DIAGNOSIS — F4312 Post-traumatic stress disorder, chronic: Secondary | ICD-10-CM | POA: Diagnosis not present

## 2021-03-09 DIAGNOSIS — F54 Psychological and behavioral factors associated with disorders or diseases classified elsewhere: Secondary | ICD-10-CM | POA: Diagnosis not present

## 2021-03-16 DIAGNOSIS — F4312 Post-traumatic stress disorder, chronic: Secondary | ICD-10-CM | POA: Diagnosis not present

## 2021-03-16 DIAGNOSIS — F54 Psychological and behavioral factors associated with disorders or diseases classified elsewhere: Secondary | ICD-10-CM | POA: Diagnosis not present

## 2021-03-23 DIAGNOSIS — F54 Psychological and behavioral factors associated with disorders or diseases classified elsewhere: Secondary | ICD-10-CM | POA: Diagnosis not present

## 2021-03-23 DIAGNOSIS — F4312 Post-traumatic stress disorder, chronic: Secondary | ICD-10-CM | POA: Diagnosis not present

## 2021-03-30 DIAGNOSIS — F4312 Post-traumatic stress disorder, chronic: Secondary | ICD-10-CM | POA: Diagnosis not present

## 2021-03-30 DIAGNOSIS — F54 Psychological and behavioral factors associated with disorders or diseases classified elsewhere: Secondary | ICD-10-CM | POA: Diagnosis not present

## 2021-04-13 DIAGNOSIS — F4312 Post-traumatic stress disorder, chronic: Secondary | ICD-10-CM | POA: Diagnosis not present

## 2021-04-13 DIAGNOSIS — F54 Psychological and behavioral factors associated with disorders or diseases classified elsewhere: Secondary | ICD-10-CM | POA: Diagnosis not present

## 2021-04-27 DIAGNOSIS — F54 Psychological and behavioral factors associated with disorders or diseases classified elsewhere: Secondary | ICD-10-CM | POA: Diagnosis not present

## 2021-04-27 DIAGNOSIS — F4312 Post-traumatic stress disorder, chronic: Secondary | ICD-10-CM | POA: Diagnosis not present

## 2021-05-04 DIAGNOSIS — F54 Psychological and behavioral factors associated with disorders or diseases classified elsewhere: Secondary | ICD-10-CM | POA: Diagnosis not present

## 2021-05-04 DIAGNOSIS — F4312 Post-traumatic stress disorder, chronic: Secondary | ICD-10-CM | POA: Diagnosis not present

## 2021-05-10 ENCOUNTER — Telehealth: Payer: Self-pay

## 2021-05-10 ENCOUNTER — Other Ambulatory Visit: Payer: Self-pay

## 2021-05-10 DIAGNOSIS — M0579 Rheumatoid arthritis with rheumatoid factor of multiple sites without organ or systems involvement: Secondary | ICD-10-CM

## 2021-05-10 MED ORDER — CYCLOBENZAPRINE HCL 10 MG PO TABS: 10.0000 mg | ORAL_TABLET | Freq: Every day | ORAL | 1 refills | Status: DC

## 2021-05-10 NOTE — Telephone Encounter (Signed)
Copied from [place] [phone]. Topic: General - Other ?>> May 10, 2021  3:54 PM Pawlus, Brayton Layman A wrote: ?Reason for CRM: Pt requested to speak directly with Baxter Flattery, offered to schedule an appt but pt stated it should have already been made, please advise. ?

## 2021-05-11 DIAGNOSIS — F4312 Post-traumatic stress disorder, chronic: Secondary | ICD-10-CM | POA: Diagnosis not present

## 2021-05-11 DIAGNOSIS — F54 Psychological and behavioral factors associated with disorders or diseases classified elsewhere: Secondary | ICD-10-CM | POA: Diagnosis not present

## 2021-05-17 ENCOUNTER — Telehealth: Payer: Self-pay | Admitting: Family Medicine

## 2021-05-17 NOTE — Telephone Encounter (Signed)
Copied from CRM [id number]. Topic: General - Other ?>> May 17, 2021 11:53 AM Gaetana Michaelis A wrote: ?Reason for CRM: The patient would like to speak with Delice Bison when possible about referral # [phone] ? ?Please contact further when available ?

## 2021-05-19 ENCOUNTER — Telehealth: Payer: Self-pay

## 2021-05-19 NOTE — Telephone Encounter (Signed)
Spoke to pt  this morning concerning the [facility] referral. Lexine Baton was able to pull [facility] records and attach them to the [facility] referral. They were sent over yesterday. I told pt to wait until tomorrow morning and if he hasn't heard anything, let me know. I will check on [facility] at that time. Also I called [place] apartments and was told 2 people are moving out, so that moves pt up 2 places in line on waiting list. I did voice concerns for pt. To Economist at [employer] and she stated she will try her best to help. ?

## 2021-05-20 ENCOUNTER — Telehealth: Payer: Self-pay

## 2021-05-20 ENCOUNTER — Telehealth: Payer: Self-pay | Admitting: Family Medicine

## 2021-05-20 NOTE — Telephone Encounter (Signed)
Copied from CRM [id number]. Topic: General - Other ?>> May 20, 2021  9:21 AM Marylen Ponto wrote: ?Reason for CRM: Pt requested to speak with Delice Bison. Pt requests return call. Cb# [phone] ?

## 2021-05-20 NOTE — Telephone Encounter (Signed)
[facility] and verified that they received the information from [place] and [facility] so he can be seen in November. They did receive it. ?

## 2021-06-01 ENCOUNTER — Ambulatory Visit: Payer: Self-pay

## 2021-06-01 ENCOUNTER — Emergency Department: Payer: Medicare Other

## 2021-06-01 ENCOUNTER — Inpatient Hospital Stay
Admit: 2021-06-01 | Discharge: 2021-06-01 | Disposition: A | Discharge status: Home / Self Care | Payer: Medicare Other | Attending: Internal Medicine | Admitting: Internal Medicine

## 2021-06-01 ENCOUNTER — Inpatient Hospital Stay
Admission: EM | Admit: 2021-06-01 | Discharge: 2021-06-03 | DRG: 871 | Disposition: A | Discharge status: Home / Self Care | Payer: Medicare Other | Attending: Internal Medicine | Admitting: Internal Medicine

## 2021-06-01 ENCOUNTER — Encounter: Payer: Self-pay | Admitting: Emergency Medicine

## 2021-06-01 ENCOUNTER — Other Ambulatory Visit: Payer: Self-pay

## 2021-06-01 ENCOUNTER — Inpatient Hospital Stay: Payer: Medicare Other

## 2021-06-01 DIAGNOSIS — E119 Type 2 diabetes mellitus without complications: Secondary | ICD-10-CM | POA: Diagnosis present

## 2021-06-01 DIAGNOSIS — Z791 Long term (current) use of non-steroidal anti-inflammatories (NSAID): Secondary | ICD-10-CM | POA: Diagnosis not present

## 2021-06-01 DIAGNOSIS — Z833 Family history of diabetes mellitus: Secondary | ICD-10-CM

## 2021-06-01 DIAGNOSIS — R200 Anesthesia of skin: Secondary | ICD-10-CM | POA: Diagnosis not present

## 2021-06-01 DIAGNOSIS — M069 Rheumatoid arthritis, unspecified: Secondary | ICD-10-CM | POA: Diagnosis present

## 2021-06-01 DIAGNOSIS — R29702 NIHSS score 2: Secondary | ICD-10-CM | POA: Diagnosis not present

## 2021-06-01 DIAGNOSIS — Z87891 Personal history of nicotine dependence: Secondary | ICD-10-CM | POA: Diagnosis not present

## 2021-06-01 DIAGNOSIS — I6389 Other cerebral infarction: Secondary | ICD-10-CM | POA: Diagnosis not present

## 2021-06-01 DIAGNOSIS — A419 Sepsis, unspecified organism: Secondary | ICD-10-CM | POA: Diagnosis present

## 2021-06-01 DIAGNOSIS — F4312 Post-traumatic stress disorder, chronic: Secondary | ICD-10-CM | POA: Diagnosis not present

## 2021-06-01 DIAGNOSIS — F54 Psychological and behavioral factors associated with disorders or diseases classified elsewhere: Secondary | ICD-10-CM | POA: Diagnosis not present

## 2021-06-01 DIAGNOSIS — F419 Anxiety disorder, unspecified: Secondary | ICD-10-CM | POA: Diagnosis present

## 2021-06-01 DIAGNOSIS — R7989 Other specified abnormal findings of blood chemistry: Secondary | ICD-10-CM | POA: Diagnosis present

## 2021-06-01 DIAGNOSIS — F418 Other specified anxiety disorders: Secondary | ICD-10-CM | POA: Diagnosis present

## 2021-06-01 DIAGNOSIS — J168 Pneumonia due to other specified infectious organisms: Secondary | ICD-10-CM | POA: Diagnosis not present

## 2021-06-01 DIAGNOSIS — I639 Cerebral infarction, unspecified: Secondary | ICD-10-CM | POA: Diagnosis present

## 2021-06-01 DIAGNOSIS — Z7984 Long term (current) use of oral hypoglycemic drugs: Secondary | ICD-10-CM | POA: Diagnosis not present

## 2021-06-01 DIAGNOSIS — R079 Chest pain, unspecified: Secondary | ICD-10-CM | POA: Diagnosis not present

## 2021-06-01 DIAGNOSIS — Z79899 Other long term (current) drug therapy: Secondary | ICD-10-CM | POA: Diagnosis not present

## 2021-06-01 DIAGNOSIS — E860 Dehydration: Secondary | ICD-10-CM | POA: Diagnosis present

## 2021-06-01 DIAGNOSIS — E871 Hypo-osmolality and hyponatremia: Secondary | ICD-10-CM | POA: Diagnosis present

## 2021-06-01 DIAGNOSIS — R29704 NIHSS score 4: Secondary | ICD-10-CM | POA: Diagnosis not present

## 2021-06-01 DIAGNOSIS — Z885 Allergy status to narcotic agent status: Secondary | ICD-10-CM | POA: Diagnosis not present

## 2021-06-01 DIAGNOSIS — Z20822 Contact with and (suspected) exposure to covid-19: Secondary | ICD-10-CM | POA: Diagnosis present

## 2021-06-01 DIAGNOSIS — M0579 Rheumatoid arthritis with rheumatoid factor of multiple sites without organ or systems involvement: Secondary | ICD-10-CM

## 2021-06-01 DIAGNOSIS — I10 Essential (primary) hypertension: Secondary | ICD-10-CM | POA: Diagnosis present

## 2021-06-01 DIAGNOSIS — R29701 NIHSS score 1: Secondary | ICD-10-CM | POA: Diagnosis present

## 2021-06-01 DIAGNOSIS — R0600 Dyspnea, unspecified: Secondary | ICD-10-CM | POA: Diagnosis not present

## 2021-06-01 DIAGNOSIS — F32A Depression, unspecified: Secondary | ICD-10-CM | POA: Diagnosis present

## 2021-06-01 DIAGNOSIS — R0602 Shortness of breath: Secondary | ICD-10-CM | POA: Diagnosis not present

## 2021-06-01 DIAGNOSIS — J189 Pneumonia, unspecified organism: Secondary | ICD-10-CM | POA: Diagnosis present

## 2021-06-01 DIAGNOSIS — R000 Tachycardia, unspecified: Secondary | ICD-10-CM | POA: Diagnosis not present

## 2021-06-01 LAB — BASIC METABOLIC PANEL
Anion gap: 7 (ref 5–15)
Anion gap: 10 (ref 5–15)
Anion gap: 9 (ref 5–15)
BUN: 7 mg/dL (ref 6–20)
BUN: 7 mg/dL (ref 6–20)
BUN: 8 mg/dL (ref 6–20)
CO2: 26 mmol/L (ref 22–32)
CO2: 25 mmol/L (ref 22–32)
CO2: 25 mmol/L (ref 22–32)
Calcium: 9 mg/dL (ref 8.9–10.3)
Calcium: 8.8 mg/dL — ABNORMAL LOW (ref 8.9–10.3)
Calcium: 8.7 mg/dL — ABNORMAL LOW (ref 8.9–10.3)
Chloride: 95 mmol/L — ABNORMAL LOW (ref 98–111)
Chloride: 97 mmol/L — ABNORMAL LOW (ref 98–111)
Chloride: 96 mmol/L — ABNORMAL LOW (ref 98–111)
Creatinine, Ser: 0.77 mg/dL (ref 0.61–1.24)
Creatinine, Ser: 0.77 mg/dL (ref 0.61–1.24)
Creatinine, Ser: 0.82 mg/dL (ref 0.61–1.24)
GFR, Estimated: >60 mL/min (ref >60)
GFR, Estimated: >60 mL/min (ref >60)
GFR, Estimated: >60 mL/min (ref >60)
Glucose, Bld: 136 mg/dL — ABNORMAL HIGH (ref 70–99)
Glucose, Bld: 112 mg/dL — ABNORMAL HIGH (ref 70–99)
Glucose, Bld: 148 mg/dL — ABNORMAL HIGH (ref 70–99)
Potassium: 4.2 mmol/L (ref 3.5–5.1)
Potassium: 4.3 mmol/L (ref 3.5–5.1)
Potassium: 4.2 mmol/L (ref 3.5–5.1)
Sodium: 128 mmol/L — ABNORMAL LOW (ref 135–145)
Sodium: 132 mmol/L — ABNORMAL LOW (ref 135–145)
Sodium: 130 mmol/L — ABNORMAL LOW (ref 135–145)

## 2021-06-01 LAB — PROTIME-INR
INR: 1.3 — ABNORMAL HIGH (ref 0.8–1.2)
Prothrombin Time: 16.4 seconds — ABNORMAL HIGH (ref 11.4–15.2)

## 2021-06-01 LAB — RESP PANEL BY RT-PCR (FLU A&B, COVID) ARPGX2
Influenza A by PCR: NEGATIVE
Influenza B by PCR: NEGATIVE
SARS Coronavirus 2 by RT PCR: NEGATIVE

## 2021-06-01 LAB — URINE DRUG SCREEN, QUALITATIVE (ARMC ONLY)
Amphetamines, Ur Screen: NOT DETECTED
Barbiturates, Ur Screen: NOT DETECTED
Benzodiazepine, Ur Scrn: NOT DETECTED
Cannabinoid 50 Ng, Ur ~~LOC~~: POSITIVE — AB
Cocaine Metabolite,Ur ~~LOC~~: NOT DETECTED
MDMA (Ecstasy)Ur Screen: NOT DETECTED
Methadone Scn, Ur: NOT DETECTED
Opiate, Ur Screen: NOT DETECTED
Phencyclidine (PCP) Ur S: NOT DETECTED
Tricyclic, Ur Screen: POSITIVE — AB

## 2021-06-01 LAB — URINALYSIS, COMPLETE (UACMP) WITH MICROSCOPIC
Bacteria, UA: NONE SEEN
Bilirubin Urine: NEGATIVE
Glucose, UA: NEGATIVE mg/dL
Hgb urine dipstick: NEGATIVE
Ketones, ur: NEGATIVE mg/dL
Leukocytes,Ua: NEGATIVE
Nitrite: NEGATIVE
Protein, ur: NEGATIVE mg/dL
Specific Gravity, Urine: 1.009 (ref 1.005–1.030)
Squamous Epithelial / HPF: NONE SEEN (ref 0–5)
WBC, UA: NONE SEEN WBC/hpf (ref 0–5)
pH: 7 (ref 5.0–8.0)

## 2021-06-01 LAB — HEMOGLOBIN A1C
Hgb A1c MFr Bld: 6.4 % — ABNORMAL HIGH (ref 4.8–5.6)
Mean Plasma Glucose: 136.98 mg/dL

## 2021-06-01 LAB — HEPATIC FUNCTION PANEL
ALT: 51 U/L — ABNORMAL HIGH (ref 0–44)
AST: 57 U/L — ABNORMAL HIGH (ref 15–41)
Albumin: 3 g/dL — ABNORMAL LOW (ref 3.5–5.0)
Alkaline Phosphatase: 198 U/L — ABNORMAL HIGH (ref 38–126)
Bilirubin, Direct: 0.2 mg/dL (ref 0.0–0.2)
Indirect Bilirubin: 0.6 mg/dL (ref 0.3–0.9)
Total Bilirubin: 0.8 mg/dL (ref 0.3–1.2)
Total Protein: 9.1 g/dL — ABNORMAL HIGH (ref 6.5–8.1)

## 2021-06-01 LAB — CBC
HCT: 38.4 % — ABNORMAL LOW (ref 39.0–52.0)
Hemoglobin: 12.1 g/dL — ABNORMAL LOW (ref 13.0–17.0)
MCH: 24.9 pg — ABNORMAL LOW (ref 26.0–34.0)
MCHC: 31.5 g/dL (ref 30.0–36.0)
MCV: 79.2 fL — ABNORMAL LOW (ref 80.0–100.0)
Platelets: 422 10*3/uL — ABNORMAL HIGH (ref 150–400)
RBC: 4.85 MIL/uL (ref 4.22–5.81)
RDW: 16.4 % — ABNORMAL HIGH (ref 11.5–15.5)
WBC: 12.6 10*3/uL — ABNORMAL HIGH (ref 4.0–10.5)
nRBC: 0 % (ref 0.0–0.2)

## 2021-06-01 LAB — ECHOCARDIOGRAM COMPLETE
AR max vel: 2.59 cm2
AV Area VTI: 2.78 cm2
AV Area mean vel: 2.3 cm2
AV Mean grad: 4 mmHg
AV Peak grad: 6.9 mmHg
Ao pk vel: 1.31 m/s
Area-P 1/2: 4.57 cm2
Height: 68 in
MV VTI: 4.52 cm2
S' Lateral: 3.08 cm
Weight: 2960 oz

## 2021-06-01 LAB — LIPID PANEL
Cholesterol: 123 mg/dL (ref 0–200)
HDL: 14 mg/dL — ABNORMAL LOW (ref >40)
LDL Cholesterol: 86 mg/dL (ref 0–99)
Total CHOL/HDL Ratio: 8.8 RATIO
Triglycerides: 115 mg/dL (ref <150)
VLDL: 23 mg/dL (ref 0–40)

## 2021-06-01 LAB — HEPATITIS PANEL, ACUTE
HCV Ab: NONREACTIVE
Hep A IgM: NONREACTIVE
Hep B C IgM: NONREACTIVE
Hepatitis B Surface Ag: NONREACTIVE

## 2021-06-01 LAB — TROPONIN I (HIGH SENSITIVITY)
Troponin I (High Sensitivity): 7 ng/L (ref <18)
Troponin I (High Sensitivity): 6 ng/L (ref <18)

## 2021-06-01 LAB — GLUCOSE, CAPILLARY
Glucose-Capillary: 159 mg/dL — ABNORMAL HIGH (ref 70–99)
Glucose-Capillary: 156 mg/dL — ABNORMAL HIGH (ref 70–99)

## 2021-06-01 LAB — APTT: aPTT: 35 seconds (ref 24–36)

## 2021-06-01 LAB — LACTIC ACID, PLASMA: Lactic Acid, Venous: 1.4 mmol/L (ref 0.5–1.9)

## 2021-06-01 LAB — PROCALCITONIN: Procalcitonin: 2.32 ng/mL

## 2021-06-01 LAB — HIV ANTIBODY (ROUTINE TESTING W REFLEX): HIV Screen 4th Generation wRfx: NONREACTIVE

## 2021-06-01 MED ORDER — INSULIN ASPART 100 UNIT/ML IJ SOLN: 0.0000 [IU] | Freq: Three times a day (TID) | INTRAMUSCULAR | Status: DC

## 2021-06-01 MED ORDER — SODIUM CHLORIDE 0.9 % IV SOLN: INTRAVENOUS | Status: DC

## 2021-06-01 MED ORDER — HYDROXYZINE HCL 25 MG PO TABS
25.0000 mg | ORAL_TABLET | Freq: Three times a day (TID) | ORAL | Status: DC | PRN
  Administered 2021-06-01: 25 mg via ORAL
  Filled 2021-06-01 (×3): qty 1

## 2021-06-01 MED ORDER — LACTATED RINGERS IV BOLUS
1000.0000 mL | Freq: Once | INTRAVENOUS | Status: AC
  Administered 2021-06-01: 1000 mL via INTRAVENOUS

## 2021-06-01 MED ORDER — ATORVASTATIN CALCIUM 20 MG PO TABS
40.0000 mg | ORAL_TABLET | Freq: Every day | ORAL | Status: DC
  Administered 2021-06-01 – 2021-06-03 (×3): 40 mg via ORAL
  Filled 2021-06-01 (×3): qty 2

## 2021-06-01 MED ORDER — HYDRALAZINE HCL 20 MG/ML IJ SOLN: 5.0000 mg | INTRAMUSCULAR | Status: DC | PRN

## 2021-06-01 MED ORDER — ONDANSETRON HCL 4 MG/2ML IJ SOLN
4.0000 mg | Freq: Three times a day (TID) | INTRAMUSCULAR | Status: DC | PRN
Start: 2021-06-01 — End: 2021-06-03

## 2021-06-01 MED ORDER — CEFTRIAXONE SODIUM 1 G IJ SOLR
1.0000 g | Freq: Once | INTRAMUSCULAR | Status: AC
  Administered 2021-06-01: 1 g via INTRAVENOUS
  Filled 2021-06-01: qty 10

## 2021-06-01 MED ORDER — ENOXAPARIN SODIUM 40 MG/0.4ML IJ SOSY
40.0000 mg | PREFILLED_SYRINGE | INTRAMUSCULAR | Status: DC
  Administered 2021-06-01: 40 mg via SUBCUTANEOUS
  Filled 2021-06-01: qty 0.4

## 2021-06-01 MED ORDER — IOHEXOL 350 MG/ML SOLN
50.0000 mL | Freq: Once | INTRAVENOUS | Status: AC | PRN
  Administered 2021-06-01: 50 mL via INTRAVENOUS

## 2021-06-01 MED ORDER — STROKE: EARLY STAGES OF RECOVERY BOOK: Freq: Once | Status: DC

## 2021-06-01 MED ORDER — ACETAMINOPHEN 500 MG PO TABS
1000.0000 mg | ORAL_TABLET | Freq: Once | ORAL | Status: AC
  Administered 2021-06-01: 1000 mg via ORAL
  Filled 2021-06-01: qty 2

## 2021-06-01 MED ORDER — SODIUM CHLORIDE 0.9 % IV SOLN
500.0000 mg | Freq: Once | INTRAVENOUS | Status: AC
  Administered 2021-06-01: 500 mg via INTRAVENOUS
  Filled 2021-06-01: qty 5

## 2021-06-01 MED ORDER — GADOBUTROL 1 MMOL/ML IV SOLN
7.5000 mL | Freq: Once | INTRAVENOUS | Status: AC | PRN
  Administered 2021-06-01: 7.5 mL via INTRAVENOUS

## 2021-06-01 MED ORDER — CYCLOBENZAPRINE HCL 10 MG PO TABS
10.0000 mg | ORAL_TABLET | Freq: Every day | ORAL | Status: DC
Start: 2021-06-01 — End: 2021-06-03
  Administered 2021-06-01 – 2021-06-02 (×2): 10 mg via ORAL
  Filled 2021-06-01 (×2): qty 1

## 2021-06-01 MED ORDER — INSULIN ASPART 100 UNIT/ML IJ SOLN: 0.0000 [IU] | Freq: Every day | INTRAMUSCULAR | Status: DC

## 2021-06-01 MED ORDER — SENNOSIDES-DOCUSATE SODIUM 8.6-50 MG PO TABS
1.0000 | ORAL_TABLET | Freq: Every evening | ORAL | Status: DC | PRN
  Filled 2021-06-01: qty 1

## 2021-06-01 MED ORDER — SODIUM CHLORIDE 0.9 % IV SOLN
500.0000 mg | INTRAVENOUS | Status: DC
  Administered 2021-06-02: 500 mg via INTRAVENOUS
  Filled 2021-06-01: qty 5
  Filled 2021-06-01: qty 500

## 2021-06-01 MED ORDER — PNEUMOCOCCAL 20-VAL CONJ VACC 0.5 ML IM SUSY
0.5000 mL | PREFILLED_SYRINGE | INTRAMUSCULAR | Status: DC
  Filled 2021-06-01: qty 0.5

## 2021-06-01 MED ORDER — ASPIRIN 81 MG PO CHEW
324.0000 mg | CHEWABLE_TABLET | Freq: Once | ORAL | Status: AC
  Administered 2021-06-01: 324 mg via ORAL
  Filled 2021-06-01: qty 4

## 2021-06-01 MED ORDER — DM-GUAIFENESIN ER 30-600 MG PO TB12: 1.0000 | ORAL_TABLET | Freq: Two times a day (BID) | ORAL | Status: DC | PRN

## 2021-06-01 MED ORDER — ASPIRIN 81 MG PO CHEW
324.0000 mg | CHEWABLE_TABLET | Freq: Every day | ORAL | Status: DC
  Administered 2021-06-02: 324 mg via ORAL
  Filled 2021-06-01: qty 4

## 2021-06-01 MED ORDER — KETOROLAC TROMETHAMINE 30 MG/ML IJ SOLN
15.0000 mg | Freq: Once | INTRAMUSCULAR | Status: AC
  Administered 2021-06-01: 15 mg via INTRAVENOUS
  Filled 2021-06-01: qty 1

## 2021-06-01 MED ORDER — GABAPENTIN 300 MG PO CAPS
300.0000 mg | ORAL_CAPSULE | Freq: Every day | ORAL | Status: DC
  Administered 2021-06-01 – 2021-06-02 (×2): 300 mg via ORAL
  Filled 2021-06-01 (×2): qty 1

## 2021-06-01 MED ORDER — IBUPROFEN 400 MG PO TABS
200.0000 mg | ORAL_TABLET | Freq: Four times a day (QID) | ORAL | Status: DC | PRN
Start: 2021-06-01 — End: 2021-06-03
  Administered 2021-06-02 – 2021-06-03 (×2): 200 mg via ORAL
  Filled 2021-06-01 (×2): qty 1

## 2021-06-01 MED ORDER — SODIUM CHLORIDE 0.9 % IV SOLN
1.0000 g | INTRAVENOUS | Status: DC
  Administered 2021-06-02: 1 g via INTRAVENOUS
  Filled 2021-06-01: qty 10
  Filled 2021-06-01: qty 1

## 2021-06-01 MED ORDER — ALBUTEROL SULFATE (2.5 MG/3ML) 0.083% IN NEBU: 2.5000 mg | INHALATION_SOLUTION | RESPIRATORY_TRACT | Status: DC | PRN

## 2021-06-01 NOTE — Assessment & Plan Note (Signed)
See A&P under CAP ?

## 2021-06-01 NOTE — ED Triage Notes (Signed)
Pt comes into the ED via POV c/o left side chest pain that started last Thursday.  Pt also states he is having numbness on the left side of his cheek and left hand.  Pt explains that the numbness started yesterday.  Pt completed a home COVID test yesterday that came back positive.  Pt currently has even and unlabored respirations.  Pt does admit to pain with inhalation.  ?

## 2021-06-01 NOTE — Assessment & Plan Note (Signed)
Patient is not taking medications currently. ?-As needed hydralazine for anxiety ?

## 2021-06-01 NOTE — Assessment & Plan Note (Signed)
Recent A1c 7.1, poorly controlled.  Patient taking metformin ?-Sliding scale insulin ?

## 2021-06-01 NOTE — Consult Note (Addendum)
?                    NEURO HOSPITALIST CONSULT NOTE  ? ?Requestig physician: Dr. Blaine Hamper ? ?Reason for Consult: Left sided sensory loss ? ?History obtained from:   Patient and Chart    ? ?HPI:                                                                                                                                         ? Patrick Perez is an 56 y.o. male with a PMHx of RA and DM who presented to the ED this AM with left cheek and hand numbness that he stated had started yesterday afternoon at about 3 PM, at which time the left side of his face and left arm became numb. States he had a similar episode in the past, but was not sure what caused it. He was also complaining of left sided CP since last Thursday, worse with breathing. His home Covid test had been positive the day before.  ? ?CT head revealed a suspected acute to subacute lacunar infarct involving the right thalamus and posterior limb of the internal capsule. ? ?MRI was then obtained, confirming presence of a subacute ischemic infarction at the same location.  ? ?Past Medical History:  ?Diagnosis Date  ? Arthritis   ? rheumatoid  ? ? ?Past Surgical History:  ?Procedure Laterality Date  ? TIBIA FRACTURE SURGERY Left   ? TONSILLECTOMY    ? ? ?Family History  ?Problem Relation Age of Onset  ? Diabetes Mother   ? Cancer Father   ?     prostate  ? Cancer Paternal Grandfather   ?     colon  ?          ? ?Social History:  reports that he quit smoking about 13 months ago. His smoking use included cigarettes. He has a 13.50 pack-year smoking history. He has never used smokeless tobacco. He reports current alcohol use of about 2.0 standard drinks per week. He reports current drug use. Frequency: 7.00 times per week. Drug: Marijuana. ? ?Allergies  ?Allergen Reactions  ? Oxycodone Hives  ? Oxycodone-Acetaminophen Anxiety  ? ? ?MEDICATIONS:                                                                                                                     ?Prior to  Admission:  ?Medications Prior to Admission  ?Medication Sig Dispense Refill Last Dose  ? cyclobenzaprine (FLEXERIL) 10 MG tablet Take 1 tablet (10 mg total) by mouth at bedtime. Dr Sunday Shams 30 tablet 1 05/31/2021  ? gabapentin (NEURONTIN) 300 MG capsule Take 1 capsule (300 mg total) by mouth at bedtime. Dr Sunday Shams 30 capsule 0 05/31/2021  ? metFORMIN (GLUCOPHAGE) 500 MG tablet Take 1 tablet (500 mg total) by mouth 2 (two) times daily with a meal. 180 tablet 1 05/31/2021  ? clotrimazole-betamethasone (LOTRISONE) cream Apply 1 application topically daily. 30 g 0 prn at unknown  ? FLUoxetine (PROZAC) 10 MG capsule Take 1 capsule by mouth daily. Dr Nicki Reaper (Patient not taking: Reported on 02/16/2021)   Not Taking  ? folic acid (FOLVITE) 1 MG tablet Take 1 mg by mouth. (Patient not taking: Reported on 06/01/2021)   Not Taking  ? gabapentin (NEURONTIN) 100 MG capsule Take 100 mg by mouth 2 (two) times daily. (Patient not taking: Reported on 06/01/2021)   Not Taking  ? glipiZIDE (GLIPIZIDE XL) 5 MG 24 hr tablet Take 1 tablet (5 mg total) by mouth daily with breakfast. (Patient not taking: Reported on 06/01/2021) 90 tablet 1 Not Taking  ? hydrOXYzine (VISTARIL) 25 MG capsule Take 1 capsule (25 mg total) by mouth 3 (three) times daily. (Patient not taking: Reported on 06/01/2021) 90 capsule 1 Not Taking  ? methotrexate 50 MG/2ML injection Inject 1 mL into the muscle once a week. Dr Sunday Shams (Patient not taking: Reported on 02/16/2021)  0 Not Taking  ? mupirocin ointment (BACTROBAN) 2 % Apply 1 application topically 2 (two) times daily. (Patient not taking: Reported on 06/01/2021) 22 g 0 Not Taking  ? Naproxen Sodium 220 MG CAPS Aleve 220 mg capsule ? prn   prn at unknown  ? Omega-3 Fatty Acids (FISH OIL) 1000 MG CAPS Take 1 capsule daily (Patient not taking: Reported on 06/01/2021)   Not Taking  ? OneTouch Delica Lancets [id number] MISC USE TO TEST ONCE DAILY 100 each 0   ? ONETOUCH ULTRA test strip USE TO TEST ONCE DAILY 100 strip 5   ?  QUEtiapine (SEROQUEL) 50 MG tablet Take 1 tablet (50 mg total) by mouth 3 (three) times daily. (Patient not taking: Reported on 02/16/2021) 90 tablet 1 Not Taking  ? Vitamin D, Cholecalciferol, 400 units TABS Take 1 tablet by mouth 1 day or 1 dose. (Patient not taking: Reported on 06/01/2021)   Not Taking  ? ?Scheduled: ?  stroke: early stages of recovery book   Does not apply Once  ? [START ON 06/02/2021] aspirin  324 mg Oral Daily  ? atorvastatin  40 mg Oral Daily  ? cyclobenzaprine  10 mg Oral QHS  ? enoxaparin (LOVENOX) injection  40 mg Subcutaneous Q24H  ? gabapentin  300 mg Oral QHS  ? insulin aspart  0-5 Units Subcutaneous QHS  ? insulin aspart  0-9 Units Subcutaneous TID WC  ? [START ON 06/02/2021] pneumococcal 20-valent conjugate vaccine  0.5 mL Intramuscular Tomorrow-1000  ? ?Continuous: ? sodium chloride Stopped (06/01/21 1140)  ? [START ON 06/02/2021] azithromycin (ZITHROMAX) 500 MG IVPB (Vial-Mate [employer])    ? [START ON 06/02/2021] cefTRIAXone (ROCEPHIN)  IV    ? ? ? ?ROS:                                                                                                                                       ?  Complaining of left sided chest pain. Other ROS as per HPI.  ? ? ?Blood pressure (!) 135/96, pulse 96, temperature 98.3 ?F (36.8 ?C), resp. rate 20, height 5\' 8"  (1.727 m), weight 83.9 kg, SpO2 97 %. ? ? ?General Examination:                                                                                                      ? ?Physical Exam  ?HEENT-  [place]/AT  ?Lungs- Respirations unlabored. Coughing was heard before examiner entered the room. ?Extremities- Warm and well perfused. Some digits with PIP joint swelling and tenderness. Left hand with edema.  ? ?Neurological Examination ?Mental Status: Awake and alert. Fully oriented.  Speech fluent without evidence of aphasia.  Able to follow all commands without difficulty. ?Cranial Nerves: ?II: Temporal visual fields intact with no extinction to DSS. PERRL.    ?III,IV, VI: No ptosis. EOMI. No nystagmus.  ?V: Temp sensation decreased on the left.  ?VII: Smile symmetric ?VIII: Hearing intact to voice ?IX,X: No hypophonia or hoarseness ?XI: Decreased shoulder shrug on the left in the context of left lateral chest pain.  ?XII: Midline tongue extension ?Motor: ?RUE 4+/5 with poor effort ?LUE 4/5 deltoid. 4+/5 elbow flexion and extension. 3/5 grip in the context of arthritic pain and swelling ?RLE 4+/5 with poor effort due to hip pain.  ?LLE 4/5 with poor effort due to hip pain.  ?Sensory: Decreased FT and temp sensation of entire hand up to forearm just proximal to wrist. RUE with normal sensation. BLE with normal FT sensation proximally.  ?Deep Tendon Reflexes: 2+ bilateral brachioradialis. 1+ bilateral patellar reflexes.  ?Cerebellar: No ataxia with FNF bilaterally  ?Gait: Deferred ?  ?Lab Results: ?Basic Metabolic Panel: ?Recent Labs  ?Lab 06/01/21 ?0920 06/01/21 ?[id number]  ?NA 128* 132*  ?K 4.2 4.3  ?CL 95* 97*  ?CO2 26 25  ?GLUCOSE 136* 112*  ?BUN 7 7  ?CREATININE 0.77 0.77  ?CALCIUM 9.0 8.8*  ? ? ?CBC: ?Recent Labs  ?Lab 06/01/21 ?0920  ?WBC 12.6*  ?HGB 12.1*  ?HCT 38.4*  ?MCV 79.2*  ?PLT 422*  ? ? ?Cardiac Enzymes: ?No results for input(s): CKTOTAL, CKMB, CKMBINDEX, TROPONINI in the last 168 hours. ? ?Lipid Panel: ?Recent Labs  ?Lab 06/01/21 ?1115  ?CHOL 123  ?TRIG 115  ?HDL 14*  ?CHOLHDL 8.8  ?VLDL 23  ?[place] 86  ? ? ?Imaging: ?CT HEAD WO CONTRAST (5MM) ? ?Result Date: 06/01/2021 ?CLINICAL DATA:  Acute left-sided facial and left upper extremity numbness beginning yesterday. EXAM: CT HEAD WITHOUT CONTRAST TECHNIQUE: Contiguous axial images were obtained from the base of the skull through the vertex without intravenous contrast. RADIATION DOSE REDUCTION: This exam was performed according to the departmental dose-optimization program which includes automated exposure control, adjustment of the mA and/or kV according to patient size and/or use of iterative reconstruction  technique. COMPARISON:  None. FINDINGS: Brain: No evidence of intracranial hemorrhage, hydrocephalus, extra-axial collection, or mass lesion/mass effect. A small poorly defined area of decreased attenuation is seen i

## 2021-06-01 NOTE — Progress Notes (Signed)
Chaplain Maggie provided Scientist, physiological education. Pt interested in designating HCPOA. Pt will request nurse page on call chaplain to coordinate notary and witness signatures. ?

## 2021-06-01 NOTE — ED Notes (Signed)
First Nurse Note:  Pt to ED via POV, pt states that he is having numbness in his left hand that started yesterday. Pt also c/o chest pain. Pt is COVID positive.  ?

## 2021-06-01 NOTE — Progress Notes (Signed)
*  PRELIMINARY RESULTS* ?Echocardiogram ?2D Echocardiogram has been performed. ? ?Patrick Perez ?06/01/2021, 1:26 PM ?

## 2021-06-01 NOTE — Telephone Encounter (Signed)
? ? ?  Chief Complaint: Tested positive for COVID 19 yesterday. ?Symptoms: Cough, pain left chest, SOB,numbness left arm and tingling left cheek. Cannot make a fist with left hand ?Frequency: This weekend ?Pertinent Negatives: Patient denies  ?Disposition: [x] ED /[] Urgent Care (no appt availability in office) / [] Appointment(In office/virtual)/ []  [place] [facility]/ [] Home Care/ [] Refused Recommended Disposition /[] [place] [facility]/ []  Follow-up with PCP ?Additional Notes: Pt. Will have his daughter take him to ED.  ?Reason for Disposition ? Patient sounds very sick or weak to the triager ? ?Answer Assessment - Initial Assessment Questions ?1. COVID-19 DIAGNOSIS: "Who made your COVID-19 diagnosis?" "Was it confirmed by a positive lab test or self-test?" If not diagnosed by a doctor (or NP/PA), ask "Are there lots of cases (community spread) where you live?" Note: See public health department website, if unsure. ?    Home test ?2. COVID-19 EXPOSURE: "Was there any known exposure to COVID before the symptoms began?" CDC Definition of close contact: within 6 feet (2 meters) for a total of 15 minutes or more over a 24-hour period.  ?    No ?3. ONSET: "When did the COVID-19 symptoms start?"  ?    Weekend ?4. WORST SYMPTOM: "What is your worst symptom?" (e.g., cough, fever, shortness of breath, muscle aches) ?    Cough ?5. COUGH: "Do you have a cough?" If Yes, ask: "How bad is the cough?"   ?    Yes ?6. FEVER: "Do you have a fever?" If Yes, ask: "What is your temperature, how was it measured, and when did it start?" ?    Low grade ?7. RESPIRATORY STATUS: "Describe your breathing?" (e.g., shortness of breath, wheezing, unable to speak)  ?    SOB  ?8. BETTER-SAME-WORSE: "Are you getting better, staying the same or getting worse compared to yesterday?"  If getting worse, ask, "In what way?" ?    Same ?9. HIGH RISK DISEASE: "Do you have any chronic medical problems?" (e.g., asthma, heart or lung disease, weak  immune system, obesity, etc.) ?    Diabetes, RA ?10. VACCINE: "Have you had the COVID-19 vaccine?" If Yes, ask: "Which one, how many shots, when did you get it?" ?      yES  ?11. BOOSTER: "Have you received your COVID-19 booster?" If Yes, ask: "Which one and when did you get it?" ?      Yes ?12. PREGNANCY: "Is there any chance you are pregnant?" "When was your last menstrual period?" ?      N/a ?13. OTHER SYMPTOMS: "Do you have any other symptoms?"  (e.g., chills, fatigue, headache, loss of smell or taste, muscle pain, sore throat) ?      Left cheek feels tingling and left arm ?14. O2 SATURATION MONITOR:  "Do you use an oxygen saturation monitor (pulse oximeter) at home?" If Yes, ask "What is your reading (oxygen level) today?" "What is your usual oxygen saturation reading?" (e.g., 95%) ?      No ? ?Protocols used: Coronavirus (COVID-19) Diagnosed or Suspected-A-AH ? ?

## 2021-06-01 NOTE — Assessment & Plan Note (Signed)
Pt has mild abnormal liver function recent AST 57, ALT 51, total bilirubin 0.8, ALP 198.  Likely due to ongoing infection. ?-Avoid using Tylenol ?-Check hepatitis panel and HIV AB ?

## 2021-06-01 NOTE — Assessment & Plan Note (Signed)
Na 128. Mental status normal.  Likely due to poor oral intake and dehydration. ?- Fluid restriction when pass swallowing screen ?- IVF: 2L of LR in ED, will continue with IV normal saline at 75 mL/h ?- f/u by BMP q8h ?- avoid over correction too fast due to risk of central pontine myelinolysis ? ?

## 2021-06-01 NOTE — Assessment & Plan Note (Addendum)
Patient has numbness in left face and left hand and forearm.  He also has minimal left hand and forearm weakness.  CT scan showed possible lacunar infarct involving the right thalamus and posterior limb of the internal capsule. MRI showed infarction of right thalamus.  MRA of head and neck  negative for LVO. Consulted Dr. Otelia Limes of neuro. ? ?- Admit to tele med bed as inpatient ? -start ASA and lipitor ?- fasting lipid panel and HbA1c  ?- 2D transthoracic echocardiography  ?- swallowing screen. If fails, will get SLP ?- Check UDS  ?- PT/OT consult ? ? ?

## 2021-06-01 NOTE — Consult Note (Signed)
CODE SEPSIS - PHARMACY COMMUNICATION ? ?**Broad Spectrum Antibiotics should be administered within 1 hour of Sepsis diagnosis** ? ?Time Code Sepsis Called/Page Received: 1058 ? ?Antibiotics Ordered: ceftriaxone, azithromycin ? ?Time of 1st antibiotic administration: 1113 ? ? ? ? ? ?Sharen Honesarissa E Tylah Mancillas, PharmD, BCPS ?Clinical Pharmacist   ?06/01/2021  11:00 AM  ?

## 2021-06-01 NOTE — ED Provider Notes (Signed)
? ?[facility] ?Provider Note ? ? ? Event Date/Time  ? First MD Initiated Contact with Patient 06/01/21 [phone]   ?  (approximate) ? ? ?History  ? ?Chest Pain, Numbness, and Covid Positive ? ? ?HPI ? ?Patrick HsuMichael Perez is a 56 y.o. male with a past medical history of diabetes and RA not on immunomodulators who presents for evaluation of approximately 1 week of worsening left-sided pleuritic chest pain associated with cough and shortness of breath.  Patient states he also felt he had a fever yesterday and took a home COVID test that was positive but would like to get this confirmed with a PCR test today as well.  He states that yesterday afternoon he felt around 3 PM the left side of his face and left arm became numb.  No trauma or injuries.  States he had a remote prior similar episode but is not sure what the cause was then.  No headache, earache, sore throat, or weakness or any numbness in the right arm or legs.  No falls or injuries.  He endorses remote tobacco abuse but none in the last year or any EtOH or illicit drug use.  No nausea, vomiting, diarrhea, abdominal pain or urinary symptoms.  He has not had any analgesia today.  No other acute concerns at this time ? ?  ?Past Medical History:  ?Diagnosis Date  ? Arthritis   ? rheumatoid  ? ? ? ?Physical Exam  ?Triage Vital Signs: ?ED Triage Vitals  ?Enc Vitals Group  ?   BP 06/01/21 0919 (!) 126/99  ?   Pulse Rate 06/01/21 0919 (!) 115  ?   Resp 06/01/21 0919 20  ?   Temp 06/01/21 0919 98.3 ?F (36.8 ?C)  ?   Temp src --   ?   SpO2 06/01/21 0919 96 %  ?   Weight 06/01/21 0922 185 lb (83.9 kg)  ?   Height 06/01/21 0922 5\' 8"  (1.727 m)  ?   Head Circumference --   ?   Peak Flow --   ?   Pain Score 06/01/21 0922 9  ?   Pain Loc --   ?   Pain Edu? --   ?   Excl. in GC? --   ? ? ?Most recent vital signs: ?Vitals:  ? 06/01/21 1042 06/01/21 1043  ?BP: (!) 132/98   ?Pulse: 99 96  ?Resp: 16 14  ?Temp:    ?SpO2: 98% 98%  ? ? ?General: Awake, seems very  uncomfortable clutching left chest. ?CV:  Good peripheral perfusion.  2+ radial pulse. ?Resp:  Normal effort.  Slightly diminished on the left but otherwise clear. ?Abd:  No distention.  Soft. ?Other:  Patient endorses decree sensation to light touch over the left face in V1 through V3 distributions as well as in the left arm and hand but is able to feel examiner stating he feels "cold" and less than on the right.  Cranial nerves II to XII are otherwise grossly intact.  No pronator drift or finger dysmetria but does have some difficulty on the left which she attributes to his pain.  No focal weakness otherwise in the extremities and sensation is intact to light touch of the right upper extremity and bilateral lower extremities ? ? ?ED Results / Procedures / Treatments  ?Labs ?(all labs ordered are listed, but only abnormal results are displayed) ?Labs Reviewed  ?BASIC METABOLIC PANEL - Abnormal; Notable for the following components:  ?  Result Value  ? Sodium 128 (*)   ? Chloride 95 (*)   ? Glucose, Bld 136 (*)   ? All other components within normal limits  ?CBC - Abnormal; Notable for the following components:  ? WBC 12.6 (*)   ? Hemoglobin 12.1 (*)   ? HCT 38.4 (*)   ? MCV 79.2 (*)   ? MCH 24.9 (*)   ? RDW 16.4 (*)   ? Platelets 422 (*)   ? All other components within normal limits  ?HEPATIC FUNCTION PANEL - Abnormal; Notable for the following components:  ? Total Protein 9.1 (*)   ? Albumin 3.0 (*)   ? AST 57 (*)   ? ALT 51 (*)   ? Alkaline Phosphatase 198 (*)   ? All other components within normal limits  ?PROTIME-INR - Abnormal; Notable for the following components:  ? Prothrombin Time 16.4 (*)   ? INR 1.3 (*)   ? All other components within normal limits  ?URINALYSIS, COMPLETE (UACMP) WITH MICROSCOPIC - Abnormal; Notable for the following components:  ? Color, Urine YELLOW (*)   ? APPearance CLEAR (*)   ? All other components within normal limits  ?RESP PANEL BY RT-PCR (FLU A&B, COVID) [id number]  ?CULTURE,  BLOOD (ROUTINE X 2)  ?CULTURE, BLOOD (ROUTINE X 2)  ?EXPECTORATED SPUTUM ASSESSMENT W GRAM STAIN, RFLX TO RESP C  ?LACTIC ACID, PLASMA  ?APTT  ?PROCALCITONIN  ?URINE DRUG SCREEN, QUALITATIVE ([facility] ONLY)  ?HEMOGLOBIN A1C  ?LIPID PANEL  ?HIV ANTIBODY (ROUTINE TESTING W REFLEX)  ?STREP PNEUMONIAE URINARY ANTIGEN  ?LEGIONELLA PNEUMOPHILA SEROGP 1 UR AG  ?HEPATITIS PANEL, ACUTE  ?BASIC METABOLIC PANEL  ?BASIC METABOLIC PANEL  ?TROPONIN I (HIGH SENSITIVITY)  ?TROPONIN I (HIGH SENSITIVITY)  ? ? ? ?EKG ? ?ECG is remarkable for sinus tachycardia with a ventricular rate of 114, normal axis, unremarkable intervals with nonspecific ST change in V2 without any other clear evidence of acute ischemia or significant arrhythmia. ? ? ?RADIOLOGY ?CT head on my interpretation without evidence of acute hemorrhage.  I reviewed radiology interpretation and agree to findings of a possible lacunar infarct involving the right thalamus acute to subacute appearing and posterior limb of internal capsule. ? ?CTA chest my interpretation without evidence of PE, large effusion but there is evidence of bilateral opacities concerning for pneumonia.  I reviewed radiology interpretation and agree to findings of multifocal ill-defined confluent nodular opacities throughout both lungs as well as some mediastinal and right hilar lymphadenopathy and diffuse hepatic steatosis. ? ? ? ?PROCEDURES: ? ?Critical Care performed: Yes, see critical care procedure note(s) ? ?.Critical Care ?Performed by: Gilles Chiquito, MD ?Authorized by: Gilles Chiquito, MD  ? ?Critical care provider statement:  ?  Critical care time (minutes):  30 ?  Critical care was necessary to treat or prevent imminent or life-threatening deterioration of the following conditions:  Sepsis and CNS failure or compromise ?  Critical care was time spent personally by me on the following activities:  Development of treatment plan with patient or surrogate, discussions with consultants,  evaluation of patient's response to treatment, examination of patient, ordering and review of laboratory studies, ordering and review of radiographic studies, ordering and performing treatments and interventions, pulse oximetry, re-evaluation of patient's condition and review of old charts ?.1-3 Lead EKG Interpretation ?Performed by: Gilles Chiquito, MD ?Authorized by: Gilles Chiquito, MD  ? ?  Interpretation: non-specific   ?  ECG rate assessment: tachycardic   ?  Rhythm: sinus  rhythm   ?  Ectopy: none   ?  Conduction: normal   ? ?The patient is on the cardiac monitor to evaluate for evidence of arrhythmia and/or significant heart rate changes. ? ? ?MEDICATIONS ORDERED IN ED: ?Medications  ?albuterol (PROVENTIL) (2.5 MG/3ML) 0.083% nebulizer solution 2.5 mg (has no administration in time range)  ?dextromethorphan-guaiFENesin (MUCINEX DM) 30-600 MG per 12 hr tablet 1 tablet (has no administration in time range)  ?ondansetron (ZOFRAN) injection 4 mg (has no administration in time range)  ?hydrALAZINE (APRESOLINE) injection 5 mg (has no administration in time range)  ?0.9 %  sodium chloride infusion (0 mLs Intravenous Hold 06/01/21 1140)  ? stroke: early stages of recovery book (0 each Does not apply Hold 06/01/21 1204)  ?senna-docusate (Senokot-S) tablet 1 tablet (has no administration in time range)  ?enoxaparin (LOVENOX) injection 40 mg (has no administration in time range)  ?ibuprofen (ADVIL) tablet 200 mg (has no administration in time range)  ?insulin aspart (novoLOG) injection 0-5 Units (has no administration in time range)  ?insulin aspart (novoLOG) injection 0-9 Units (has no administration in time range)  ?acetaminophen (TYLENOL) tablet 1,000 mg (1,000 mg Oral Given 06/01/21 0939)  ?lactated ringers bolus 1,000 mL (0 mLs Intravenous Stopped 06/01/21 1113)  ?iohexol (OMNIPAQUE) 350 MG/ML injection 50 mL (50 mLs Intravenous Contrast Given 06/01/21 1009)  ?ketorolac (TORADOL) 30 MG/ML injection 15 mg (15 mg  Intravenous Given 06/01/21 1042)  ?aspirin chewable tablet 324 mg (324 mg Oral Given 06/01/21 1118)  ?cefTRIAXone (ROCEPHIN) 1 g in sodium chloride 0.9 % 100 mL IVPB (0 g Intravenous Stopped 06/01/21 1138)  ?lactated ringers bol

## 2021-06-01 NOTE — ED Notes (Signed)
Pt taken to MRI  

## 2021-06-01 NOTE — Sepsis Progress Note (Signed)
Code sepsis protocol being monitored by eLink. 

## 2021-06-01 NOTE — Progress Notes (Signed)
SLP Cancellation Note ? ?Patient Details ?Name: Patrick Perez ?MRN: [id number] ?DOB: Jun 22, 1965 ? ? ?Cancelled treatment:       Reason Eval/Treat Not Completed: SLP screened, no needs identified, will sign off (chart reviewed; consulted NSG re: pt's status then met w/ pt in room) ?Pt denied any difficulty swallowing and is currently on a regular diet; tolerates swallowing pills w/ water per NSG. Pt endorsed lack of appetite currently. Gave menu and encouraged him to re-order something of interest.  ?Pt conversed in general conversation w/out overt expressive/receptive deficits noted; pt denied any speech-language deficits. Speech clear and intelligible. Pt seemed to enjoy talking about various topics; friend present in room. Pt did endorse mild tingling and numbness in Left cheek, but no oral weakness or droop was noted. ?No further skilled ST services indicated as pt appears at his baseline re: swallowing and speech. Pt agreed. MD to reconsult if any change in status while admitted.   ? ? ? ? ?Orinda Kenner, MS, CCC-SLP ?Speech Language Pathologist ?[facility]; [place] ?[phone] ([phone]) ?Madalyn Legner ?06/01/2021, 6:53 PM ?

## 2021-06-01 NOTE — Evaluation (Signed)
Occupational Therapy Evaluation ?Patient Details ?Name: Patrick Perez ?MRN: [id number] ?DOB: 1965-07-26 ?Today's Date: 06/01/2021 ? ? ?History of Present Illness Pt is a 56 year old male with admitted with pneumonia and MRI significant for Acute infarct in the right thalamus. PMH significant diabetes mellitus, depression with anxiety, rheumatoid arthritis, marijuana abuse, who presents with chest pain, shortness breath, numbness in left cheek and left hand and forearm.  ? ?Clinical Impression ?  ?Chart reviewed, RN cleared pt for participation in OT evaluation. Pt is alert and oriented x4, agreeable to evaluation. PTA pt reports he lives in his truck, showers at his girlfriends house. He reports MOD I with ADL with increased time due to baseline RA. Pt presents with deficits in L arm sensation with deficits noted throughout medal aspect of forearm, D4-5. Grip/pinch is impaired at baseline but worsening this past week per pt report. Vision appears [facility] and unchanged as compared to PLOF. Pt is able to perform all ADL with MOD I-supervision with vcs for compensatory techniques with L sided pain. Pt educated re: HEP, compensatory strategies for mobility, will benefit from further education. At this time, pt would benefit from outpatient OT to address deficits. Pt is left as received, NAD, all needs met. OT will continue to follow.   ?   ? ?Recommendations for follow up therapy are one component of a multi-disciplinary discharge planning process, led by the attending physician.  Recommendations may be updated based on patient status, additional functional criteria and insurance authorization.  ? ?Follow Up Recommendations ? Outpatient OT  ?  ?Assistance Recommended at Discharge Intermittent Supervision/Assistance  ?Patient can return home with the following Assistance with cooking/housework;Assist for transportation ? ?  ?Functional Status Assessment ? Patient has had a recent decline in their functional status and  demonstrates the ability to make significant improvements in function in a reasonable and predictable amount of time.  ?Equipment Recommendations ? None recommended by OT  ?  ?Recommendations for Other Services   ? ? ?  ?Precautions / Restrictions Precautions ?Precautions: None ?Restrictions ?Weight Bearing Restrictions: No  ? ?  ? ?Mobility Bed Mobility ?Overal bed mobility: Needs Assistance ?Bed Mobility: Supine to Sit, Sit to Supine ?  ?  ?Supine to sit: Modified independent (Device/Increase time) ?Sit to supine: Modified independent (Device/Increase time) ?  ?General bed mobility comments: use of log roll technique due to pain ?  ? ?Transfers ?Overall transfer level: Modified independent ?  ?  ?  ?  ?  ?  ?  ?  ?  ?  ? ?  ?Balance Overall balance assessment: Needs assistance ?Sitting-balance support: Feet supported ?Sitting balance-Leahy Scale: Normal ?  ?  ?Standing balance support: During functional activity ?Standing balance-Leahy Scale: Good ?  ?  ?  ?  ?  ?  ?  ?  ?  ?  ?  ?  ?   ? ?ADL either performed or assessed with clinical judgement  ? ?ADL Overall ADL's : Needs assistance/impaired ?  ?  ?Grooming: Wash/dry hands;Wash/dry face;Standing;Supervision/safety ?  ?  ?  ?  ?  ?  ?  ?Lower Body Dressing: Set up;Sit to/from stand ?  ?Toilet Transfer: Supervision/safety;Ambulation ?  ?  ?  ?  ?  ?Functional mobility during ADLs: Modified independent;Supervision/safety (vcs for line/lead managment, approx 50 feet) ?General ADL Comments: increased time throughout due to RA sypmtoms  ? ? ? ?Vision Patient Visual Report: No change from baseline;Blurring of vision ?Additional Comments: pt reports intermittent blurring of  vision at baseline  ?   ?Perception Perception ?Perception: Within Functional Limits ?  ?Praxis Praxis ?Praxis: Intact ?  ? ?Pertinent Vitals/Pain Pain Assessment ?Pain Assessment: 0-10 ?Pain Score: 8  ?Pain Location: L flank ?Pain Descriptors / Indicators: Sharp, Stabbing ?Pain Intervention(s):  Limited activity within patient's tolerance, Monitored during session, Repositioned  ? ? ? ?Hand Dominance Right ?  ?Extremity/Trunk Assessment Upper Extremity Assessment ?Upper Extremity Assessment: LUE deficits/detail;RUE deficits/detail ?RUE Deficits / Details: pt with baseline defcits due to RA, AROM shoulder flexion to approx 90 degrees, elbow approx 3/4 full AROM, wrist 3/4 full AROM; weak grip strength ?LUE Deficits / Details: AROM: L shoudler flexion to approx 80 degrees, pain after 90 degrees flexion. Elbow appears WFL, wrist is limited due to pain. Baseline pt reports difficulties with grip strength in LUE due to RA, worsening over the past week. Pt unable to move digits more than 1/4 out of extension towards closed fist. PROM appears [facility] however assessment is limited due to reported pain. ?LUE: Unable to fully assess due to pain ?LUE Sensation: decreased light touch;decreased proprioception (pt with impaired light touch along medial forearm D 4-5) ?LUE Coordination: decreased fine motor;decreased gross motor ?  ?Lower Extremity Assessment ?Lower Extremity Assessment: Defer to PT evaluation ?  ?Cervical / Trunk Assessment ?Cervical / Trunk Assessment: Normal ?  ?Communication Communication ?Communication: No difficulties ?  ?Cognition Arousal/Alertness: Awake/alert ?Behavior During Therapy: [facility] for tasks assessed/performed ?Overall Cognitive Status: Within Functional Limits for tasks assessed ?  ?  ?  ?  ?  ?  ?  ?  ?  ?  ?  ?  ?  ?  ?  ?  ?General Comments: appropriate awareness of deficits ?  ?  ?General Comments  vss throughout ? ?  ?Exercises Other Exercises ?Other Exercises: edu re: role of OT, role of rehab, discharge recommendations, LUE HEP, home safety, falls prevention ?  ?Shoulder Instructions    ? ? ?Home Living Family/patient expects to be discharged to:: Private residence ?Living Arrangements: Other (Comment) (pt is homeless, sleeps in his car.  Reports he showers at his girlfriends but is  unable to stay there) ?Available Help at Discharge: Friend(s) ?Type of Home: Homeless (sleeps in his truck) ?  ?  ?  ?  ?  ?  ?  ?  ?  ?  ?  ?Home Equipment: Kasandra Knudsen - single point ?  ?Additional Comments: Pt is homeless and sleeps in his truck.  He reports he showers at his girlfriends but is unable to stay there. He reports 20 lb weight loss. MOD I for ADL with increased time due to baseline RA ?  ? ?  ?Prior Functioning/Environment Prior Level of Function : Independent/Modified Independent ?  ?  ?  ?  ?  ?  ?  ?  ?  ? ?  ?  ?OT Problem List: Decreased strength;Decreased knowledge of use of DME or AE;Decreased range of motion;Decreased activity tolerance;Cardiopulmonary status limiting activity ?  ?   ?OT Treatment/Interventions: Self-care/ADL training;Therapeutic exercise;Patient/family education;Modalities;Therapeutic activities;DME and/or AE instruction  ?  ?OT Goals(Current goals can be found in the care plan section) Acute Rehab OT Goals ?Patient Stated Goal: decrease pain ?OT Goal Formulation: With patient ?Time For Goal Achievement: 06/15/21 ?Potential to Achieve Goals: Good ?ADL Goals ?Pt Will Perform Lower Body Dressing: with modified independence;with adaptive equipment ?Pt Will Transfer to Toilet: with modified independence ?Pt/caregiver will Perform Home Exercise Program: Left upper extremity;With written HEP provided;Independently;Increased ROM;Increased strength  ?  OT Frequency: Min 3X/week ?  ? ?Co-evaluation   ?  ?  ?  ?  ? ?  ?AM-PAC OT "6 Clicks" Daily Activity     ?Outcome Measure Help from another person eating meals?: None ?Help from another person taking care of personal grooming?: None ?Help from another person toileting, which includes using toliet, bedpan, or urinal?: None ?Help from another person bathing (including washing, rinsing, drying)?: A Little ?Help from another person to put on and taking off regular upper body clothing?: None ?Help from another person to put on and taking off  regular lower body clothing?: None ?6 Click Score: 23 ?  ?End of Session Equipment Utilized During Treatment: Gait belt ?Nurse Communication: Mobility status ? ?Activity Tolerance: Patient tolerated treatment well ?Pati
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
## 2021-06-01 NOTE — Assessment & Plan Note (Signed)
Sepsis due to CAP: CT angiogram is negative for PE, but showed multifocal infiltration.  Patient has sepsis with WBC 12.6, tachycardia with heart rate 116, RR 21.  Lactic acid is normal 1.4. ? ?- IV Rocephin and azithromycin ?- Mucinex for cough  ?- Bronchodilators ?- Urine legionella and S. pneumococcal antigen ?- Follow up blood culture x2, sputum culture ?- will get Procalcitonin ?- IVF: 2L of LR bolus in ED, followed by 75 mL per hour of NS  ? ?

## 2021-06-01 NOTE — Assessment & Plan Note (Signed)
Patient used to be on methotrexate, currently not taking this medication.  Patient is taking naproxen sometimes. ? ?-As needed ibuprofen ?

## 2021-06-01 NOTE — Evaluation (Signed)
Physical Therapy Evaluation ?Patient Details ?Name: Patrick Perez ?MRN: [id number] ?DOB: 07-Jun-1965 ?Today's Date: 06/01/2021 ? ?History of Present Illness ? 56 year old male with admitted with pneumonia and MRI significant for Acute infarct in the right thalamus. PMH significant diabetes mellitus, depression with anxiety, rheumatoid arthritis, marijuana abuse, who presents with chest pain, shortness breath, numbness in left cheek and left hand and forearm.  ?Clinical Impression ? Reports that RA is flared up and L rib hurting from PNA but otherwise he was able to mobilize and move safely albeit with guarded limp that he reports is essentially baseline.  He does have significant L hand/UE weakness that is new and even during our relatively brief PT exam and mobility he dropped objects from L hand multiple times.  Will benefit from continued PT here and at discharge, he is not at his baseline and does not have a stable discharge venue and outpatient PT recommended.  ?   ? ?Recommendations for follow up therapy are one component of a multi-disciplinary discharge planning process, led by the attending physician.  Recommendations may be updated based on patient status, additional functional criteria and insurance authorization. ? ?Follow Up Recommendations Outpatient PT ? ?  ?Assistance Recommended at Discharge Set up Supervision/Assistance  ?Patient can return home with the following ? Assistance with cooking/housework;A little help with bathing/dressing/bathroom;Assist for transportation ? ?  ?Equipment Recommendations None recommended by PT (may benefit from walker?)  ?Recommendations for Other Services ?    ?  ?Functional Status Assessment Patient has had a recent decline in their functional status and demonstrates the ability to make significant improvements in function in a reasonable and predictable amount of time.  ? ?  ?Precautions / Restrictions Precautions ?Precautions: None ?Restrictions ?Weight Bearing  Restrictions: No  ? ?  ? ?Mobility ? Bed Mobility ?Overal bed mobility: Modified Independent ?Bed Mobility: Supine to Sit ?  ?  ?Supine to sit: Modified independent (Device/Increase time) ?  ?  ?  ?  ? ?Transfers ?Overall transfer level: Modified independent ?Equipment used: None ?  ?  ?  ?  ?  ?  ?  ?General transfer comment: able to rise w/o assist, showed good awareness and safety with transition despite clearly having LE pain/stiffness ?  ? ?Ambulation/Gait ?Ambulation/Gait assistance: Supervision ?Gait Distance (Feet): 150 Feet ?Assistive device: None ?  ?  ?  ?  ?General Gait Details: Pt was able to ambulate with slow, limping (near baseline) but otherwise functional cadence.  Vitals remain appropriate, reports increasing L LE soreness with increased activity ? ?Stairs ?Stairs: Yes ?Stairs assistance: Supervision ?Stair Management: No rails (leans hip back on rail) ?Number of Stairs: 8 ?General stair comments: step-to strategy up and down to protect L LE (baseline, not new from CVA) ? ?Wheelchair Mobility ?  ? ?Modified Rankin (Stroke Patients Only) ?  ? ?  ? ?Balance Overall balance assessment: Needs assistance ?Sitting-balance support: Feet supported ?Sitting balance-Leahy Scale: Normal ?  ?  ?Standing balance support: During functional activity ?Standing balance-Leahy Scale: Good ?  ?  ?  ?  ?  ?  ?  ?  ?  ?  ?  ?  ?   ? ? ? ?Pertinent Vitals/Pain Pain Assessment ?Pain Assessment: 0-10 ?Pain Score: 7  ?Pain Location: L ribs and chronic RA L>R knee pain  ? ? ?Home Living Family/patient expects to be discharged to:: Private residence ?Living Arrangements:  (lives in his truck, cannot stay at g-friends apartment) ?Available Help at Discharge: Friend(s) ?  Type of Home: Homeless ?  ?  ?  ?  ?  ?Home Equipment: Cane - single point;Crutches ?Additional Comments: Pt is homeless and sleeps in his truck.  He reports he showers at his girlfriends but is unable to stay there. He reports 20 lb weight loss. MOD I for ADL  with increased time due to baseline RA  ?  ?Prior Function Prior Level of Function : Independent/Modified Independent ?  ?  ?  ?  ?  ?  ?Mobility Comments: able to ambulate as much as needed when RA is flared up, limited limp when it is ?  ?  ? ? ?Hand Dominance  ? Dominant Hand: Right ? ?  ?Extremity/Trunk Assessment  ? Upper Extremity Assessment ?Upper Extremity Assessment: LUE deficits/detail;RUE deficits/detail ?RUE Deficits / Details: pt with baseline defcits due to RA, AROM shoulder flexion to approx 90 degrees, elbow approx 3/4 full AROM, wrist 3/4 full AROM; weak grip strength ?LUE Deficits / Details: AROM: L shoudler flexion to approx 80 degrees, pain after 90 degrees flexion. Elbow appears WFL, wrist is limited due to pain. Baseline pt reports difficulties with grip strength in LUE due to RA, worsening over the past week. Pt unable to move digits more than 1/4 out of extension towards closed fist. [facility] however assessment is limited due to reported pain. ?LUE: Unable to fully assess due to pain ?LUE Sensation: decreased light touch;decreased proprioception ?LUE Coordination: decreased fine motor;decreased gross motor ?  ? ?Lower Extremity Assessment ?Lower Extremity Assessment: Generalized weakness (L LE chronically weaker and more painful (RA) appears to have near baseline, functional ROM and strength) ?  ? ?Cervical / Trunk Assessment ?Cervical / Trunk Assessment: Normal  ?Communication  ? Communication: No difficulties  ?Cognition Arousal/Alertness: Awake/alert ?Behavior [facility]: [facility] for tasks assessed/performed ?Overall Cognitive Status: Within Functional Limits for tasks assessed ?  ?  ?  ?  ?  ?  ?  ?  ?  ?  ?  ?  ?  ?  ?  ?  ?  ?  ?  ? ?  ?General Comments General comments (skin integrity, edema, etc.): vss throughout ? ?  ?Exercises    ? ?Assessment/Plan  ?  ?PT Assessment Patient needs continued PT services  ?PT Problem List Decreased strength;Decreased range of motion;Decreased  activity tolerance;Decreased balance;Decreased mobility;Decreased coordination;Decreased knowledge of use of DME;Decreased safety awareness;Pain ? ?   ?  ?PT Treatment Interventions DME instruction;Gait training;Functional mobility training;Therapeutic exercise;Balance training;Neuromuscular re-education;Therapeutic activities;Patient/family education   ? ?PT Goals (Current goals can be found in the Care Plan section)  ?Acute Rehab PT Goals ?Patient Stated Goal: get L hand moving better ?PT Goal Formulation: With patient ?Time For Goal Achievement: 06/15/21 ?Potential to Achieve Goals: Good ? ?  ?Frequency 7X/week ?  ? ? ?Co-evaluation   ?  ?  ?  ?  ? ? ?  ?AM-PAC PT "6 Clicks" Mobility  ?Outcome Measure Help needed turning from your back to your side while in a flat bed without using bedrails?: None ?Help needed moving from lying on your back to sitting on the side of a flat bed without using bedrails?: None ?Help needed moving to and from a bed to a chair (including a wheelchair)?: None ?Help needed standing up from a chair using your arms (e.g., wheelchair or bedside chair)?: None ?Help needed to walk in hospital room?: None ?Help needed climbing 3-5 steps with a railing? : A Little ?6 Click Score: 23 ? ?  ?  End of Session   ?Activity Tolerance: Patient tolerated treatment well ?Patient left: in chair;with call bell/phone within reach ?Nurse Communication: Mobility status ?PT Visit Diagnosis: Unsteadiness on feet (R26.81);Muscle weakness (generalized) (M62.81);Hemiplegia and hemiparesis ?Hemiplegia - Right/Left: Left ?Hemiplegia - dominant/non-dominant: Non-dominant ?Hemiplegia - caused by: Cerebral infarction ?  ? ?Time: 8088-1103 ?PT Time Calculation (min) (ACUTE ONLY): 23 min ? ? ?Charges:   PT Evaluation ?$PT Eval Low Complexity: 1 Low ?PT Treatments ?$Therapeutic Activity: 8-22 mins ?  ?   ? ? ?Malachi Pro, DPT ?06/01/2021, 5:34 PM ? ?

## 2021-06-01 NOTE — H&P (Signed)
?History and Physical  ? ? ?[name] JXB:[id number] DOB: Feb 22, 1965 DOA: 06/01/2021 ? ?Referring MD/NP/PA:  ? ?PCP: Duanne Limerick, MD  ? ?Perez coming from:  Patrick Perez is coming from home.  At baseline, pt is independent for most of ADL.       ? ?Chief Complaint: Chest pain, shortness breath, numbness in left cheek and left hand and forearm ? ?HPI: Patrick Perez is a 56 y.o. male with medical history significant of diabetes mellitus, depression with anxiety, rheumatoid arthritis, marijuana abuse, who presents with chest pain, shortness breath, numbness in left cheek and left hand and forearm. ? ?Perez states that he has chest pain and shortness of breath for almost a week.  He has cough with dark-colored mucus production.  Denies fever or chills.  His chest pain is located in left side of Patrick chest, moderate, sharp, pleuritic, aggravated by deep breath, nonradiating.  Perez states that he was tested positive for COVID at home. Perez also reports numbness in left cheek and left hand and forearm since yesterday at about 3 PM.  He also has mild weakness in left hand and forearm.  No numbness or weakness in Patrick legs.  No facial droop or slurred speech.  No difficulty swallowing.  No hearing loss or vision loss.  Perez does not have nausea vomiting, diarrhea or abdominal pain.  No symptoms of UTI. ? ? ?Data Reviewed and ED Course: pt was found to have WBC 12.6, sodium 128, negative COVID PCR, electrolytes renal function okay, mild abnormal liver function (ALP 198, AST 57, ALT 51, total bilirubin 0.8), temperature normal, blood pressure 132/98, heart rate of 116, RR 21, oxygen saturation 98% on room air.  CT angiograms negative for PE, showed multifocal infiltration.  CT of Perez showed possible lacunar infarct.  Perez is admitted to MedSurg bed as inpatient ? ?CT scan of Perez: ?Suspected lacunar infarct involving Patrick right thalamus and posterior ?limb of Patrick internal capsule, which may be acute or  subacute in age. ?Consider brain MRI for further evaluation if clinically warranted. ? ?MRI-brain and MRA of neck and Perez ?1. Acute infarct in Patrick right thalamus. ?2. No intracranial large vessel occlusion or significant stenosis. ?3. No hemodynamically significant stenosis in Patrick neck. ? ? ?EKG: I have personally reviewed.  Sinus rhythm, QTc 427, LAE, nonspecific T wave change ? ? ?Review of Systems:  ? ?General: no fevers, chills, no body weight gain, has fatigue ?HEENT: no blurry vision, hearing changes or sore throat ?Respiratory: has dyspnea, coughing, no wheezing ?CV: has chest pain, no palpitations ?GI: no nausea, vomiting, abdominal pain, diarrhea, constipation ?GU: no dysuria, burning on urination, increased urinary frequency, hematuria  ?Ext: no leg edema ?Neuro: No vision change or hearing loss. Has numbness in left hand and left forearm, with minimal weakness in left hand and forearm. ?Skin: no rash, no skin tear. ?MSK: Bilateral knee pain. ?Heme: No easy bruising.  ?Travel history: No recent long distant travel. ? ? ?Allergy:  ?Allergies  ?Allergen Reactions  ? Oxycodone Hives  ? Oxycodone-Acetaminophen Anxiety  ? ? ?Past Medical History:  ?Diagnosis Date  ? Arthritis   ? rheumatoid  ? ? ?Past Surgical History:  ?Procedure Laterality Date  ? TIBIA FRACTURE SURGERY Left   ? TONSILLECTOMY    ? ? ?Social History:  reports that he quit smoking about 13 months ago. His smoking use included cigarettes. He has a 13.50 pack-year smoking history. He has never used smokeless tobacco. He reports current alcohol  use of about 2.0 standard drinks per week. He reports current drug use. Frequency: 7.00 times per week. Drug: Marijuana. ? ?Family History:  ?Family History  ?Problem Relation Age of Onset  ? Diabetes Mother   ? Cancer Father   ?     prostate  ? Cancer Paternal Grandfather   ?     colon  ?  ? ?Prior to Admission medications   ?Medication Sig Start Date End Date Taking? Authorizing Provider   ?clotrimazole-betamethasone (LOTRISONE) cream Apply 1 application topically daily. 02/16/21   Duanne Limerick, MD  ?cyclobenzaprine (FLEXERIL) 10 MG tablet Take 1 tablet (10 mg total) by mouth at bedtime. Dr Alfredo Batty 05/10/21   Duanne Limerick, MD  ?FLUoxetine (PROZAC) 10 MG capsule Take 1 capsule by mouth daily. Dr Keturah Barre ?Perez not taking: Reported on 02/16/2021 07/07/20   [provider]  ?folic acid (FOLVITE) 1 MG tablet Take 1 mg by mouth. 01/20/13   [provider]  ?gabapentin (NEURONTIN) 100 MG capsule Take 100 mg by mouth 2 (two) times daily.    [provider]  ?gabapentin (NEURONTIN) 300 MG capsule Take 1 capsule (300 mg total) by mouth at bedtime. Dr Alfredo Batty ?Perez not taking: Reported on 02/16/2021 05/11/20   Duanne Limerick, MD  ?glipiZIDE (GLIPIZIDE XL) 5 MG 24 hr tablet Take 1 tablet (5 mg total) by mouth daily with breakfast. 02/16/21   Duanne Limerick, MD  ?hydrOXYzine (VISTARIL) 25 MG capsule Take 1 capsule (25 mg total) by mouth 3 (three) times daily. ?Perez taking differently: Take 25 mg by mouth 3 (three) times daily. bagey 05/20/20   Duanne Limerick, MD  ?metFORMIN (GLUCOPHAGE) 500 MG tablet Take 1 tablet (500 mg total) by mouth 2 (two) times daily with a meal. 02/16/21   Duanne Limerick, MD  ?methotrexate 50 MG/2ML injection Inject 1 mL into Patrick muscle once a week. Dr Alfredo Batty ?Perez not taking: Reported on 02/16/2021 07/14/14   [provider]  ?mupirocin ointment (BACTROBAN) 2 % Apply 1 application topically 2 (two) times daily. 10/22/19   Duanne Limerick, MD  ?Naproxen Sodium 220 MG CAPS Aleve 220 mg capsule ? prn    [provider]  ?Omega-3 Fatty Acids (FISH OIL) 1000 MG CAPS Take 1 capsule daily    [provider]  ?OneTouch Delica Lancets 33G MISC USE TO TEST ONCE DAILY 02/16/21   Duanne Limerick, MD  ?[facility] ULTRA test strip USE TO TEST ONCE DAILY 09/13/20   Duanne Limerick, MD  ?QUEtiapine (SEROQUEL) 50 MG tablet Take 1 tablet (50 mg total) by  mouth 3 (three) times daily. ?Perez not taking: Reported on 02/16/2021 05/20/20   Duanne Limerick, MD  ?Vitamin D, Cholecalciferol, 400 units TABS Take 1 tablet by mouth 1 day or 1 dose.    [provider]  ? ? ?Physical Exam: ?Vitals:  ? 06/01/21 1043 06/01/21 1100 06/01/21 1130 06/01/21 1200  ?BP:  (!) 128/97 (!) 135/94 (!) 135/96  ?Pulse: 96 99 (!) 106 96  ?Resp: 14 (!) 23 (!) 21 20  ?Temp:      ?SpO2: 98% 98% 98% 97%  ?Weight:      ?Height:      ? ?General: Not in acute distress ?HEENT: ?      Eyes: PERRL, EOMI, no scleral icterus. ?      ENT: No discharge from Patrick ears and nose, no pharynx injection, no tonsillar enlargement.  ?      Neck:  No JVD, no bruit, no mass felt. ?Heme: No neck lymph node enlargement. ?Cardiac: S1/S2, RRR, No murmurs, No gallops or rubs. ?Respiratory: No rales, wheezing, rhonchi or rubs. ?GI: Soft, nondistended, nontender, no rebound pain, no organomegaly, BS present. ?GU: No hematuria ?Ext: No pitting leg edema bilaterally. 1+DP/PT pulse bilaterally. ?Musculoskeletal: No joint deformities, No joint redness or warmth, no limitation of ROM in spin. ?Skin: No rashes.  ?Neuro: Alert, oriented X3, cranial nerves II-XII grossly intact, moves all extremities normally. Sensation to light touch is decreased in left face and left hand. Brachial reflex 2+ bilaterally. ?Psych: Perez is not psychotic, no suicidal or hemocidal ideation. ? ?Labs on Admission: I have personally reviewed following labs and imaging studies ? ?CBC: ?Recent Labs  ?Lab 06/01/21 ?0920  ?WBC 12.6*  ?HGB 12.1*  ?HCT 38.4*  ?MCV 79.2*  ?PLT 422*  ? ?Basic Metabolic Panel: ?Recent Labs  ?Lab 06/01/21 ?0920 06/01/21 ?1214  ?NA 128* 132*  ?K 4.2 4.3  ?CL 95* 97*  ?CO2 26 25  ?GLUCOSE 136* 112*  ?BUN 7 7  ?CREATININE 0.77 0.77  ?CALCIUM 9.0 8.8*  ? ?GFR: ?Estimated Creatinine Clearance: 111.4 mL/min (by C-G formula based on SCr of 0.77 mg/dL). ?Liver Function Tests: ?Recent Labs  ?Lab 06/01/21 ?0920  ?AST 57*  ?ALT 51*   ?ALKPHOS 198*  ?BILITOT 0.8  ?PROT 9.1*  ?ALBUMIN 3.0*  ? ?No results for input(s): LIPASE, AMYLASE in Patrick last 168 hours. ?No results for input(s): AMMONIA in Patrick last 168 hours. ?Coagulation Profile: ?Recent Labs  ?Lab 04/1

## 2021-06-02 ENCOUNTER — Telehealth: Payer: Self-pay

## 2021-06-02 DIAGNOSIS — F418 Other specified anxiety disorders: Secondary | ICD-10-CM

## 2021-06-02 DIAGNOSIS — J189 Pneumonia, unspecified organism: Secondary | ICD-10-CM | POA: Diagnosis not present

## 2021-06-02 DIAGNOSIS — A419 Sepsis, unspecified organism: Secondary | ICD-10-CM | POA: Diagnosis not present

## 2021-06-02 DIAGNOSIS — I639 Cerebral infarction, unspecified: Secondary | ICD-10-CM | POA: Diagnosis not present

## 2021-06-02 LAB — GLUCOSE, CAPILLARY
Glucose-Capillary: 166 mg/dL — ABNORMAL HIGH (ref 70–99)
Glucose-Capillary: 110 mg/dL — ABNORMAL HIGH (ref 70–99)
Glucose-Capillary: 129 mg/dL — ABNORMAL HIGH (ref 70–99)
Glucose-Capillary: 131 mg/dL — ABNORMAL HIGH (ref 70–99)

## 2021-06-02 LAB — CBC
HCT: 33.7 % — ABNORMAL LOW (ref 39.0–52.0)
Hemoglobin: 10.8 g/dL — ABNORMAL LOW (ref 13.0–17.0)
MCH: 25.2 pg — ABNORMAL LOW (ref 26.0–34.0)
MCHC: 32 g/dL (ref 30.0–36.0)
MCV: 78.7 fL — ABNORMAL LOW (ref 80.0–100.0)
Platelets: 367 10*3/uL (ref 150–400)
RBC: 4.28 MIL/uL (ref 4.22–5.81)
RDW: 16.6 % — ABNORMAL HIGH (ref 11.5–15.5)
WBC: 10.7 10*3/uL — ABNORMAL HIGH (ref 4.0–10.5)
nRBC: 0 % (ref 0.0–0.2)

## 2021-06-02 LAB — BASIC METABOLIC PANEL
Anion gap: 7 (ref 5–15)
BUN: 7 mg/dL (ref 6–20)
CO2: 25 mmol/L (ref 22–32)
Calcium: 8.7 mg/dL — ABNORMAL LOW (ref 8.9–10.3)
Chloride: 102 mmol/L (ref 98–111)
Creatinine, Ser: 0.76 mg/dL (ref 0.61–1.24)
GFR, Estimated: >60 mL/min (ref >60)
Glucose, Bld: 109 mg/dL — ABNORMAL HIGH (ref 70–99)
Potassium: 4.3 mmol/L (ref 3.5–5.1)
Sodium: 134 mmol/L — ABNORMAL LOW (ref 135–145)

## 2021-06-02 LAB — EXPECTORATED SPUTUM ASSESSMENT W GRAM STAIN, RFLX TO RESP C

## 2021-06-02 LAB — STREP PNEUMONIAE URINARY ANTIGEN: Strep Pneumo Urinary Antigen: NEGATIVE

## 2021-06-02 MED ORDER — SALINE SPRAY 0.65 % NA SOLN
1.0000 | NASAL | Status: DC | PRN
  Filled 2021-06-02: qty 44

## 2021-06-02 MED ORDER — ASPIRIN 81 MG PO CHEW
81.0000 mg | CHEWABLE_TABLET | Freq: Every day | ORAL | Status: DC
  Administered 2021-06-03: 81 mg via ORAL
  Filled 2021-06-02: qty 1

## 2021-06-02 MED ORDER — ADULT MULTIVITAMIN W/MINERALS CH
1.0000 | ORAL_TABLET | Freq: Every day | ORAL | Status: DC
  Administered 2021-06-02 – 2021-06-03 (×2): 1 via ORAL
  Filled 2021-06-02 (×2): qty 1

## 2021-06-02 MED ORDER — MELATONIN 5 MG PO TABS
5.0000 mg | ORAL_TABLET | Freq: Every day | ORAL | Status: DC
  Administered 2021-06-02: 5 mg via ORAL
  Filled 2021-06-02: qty 1

## 2021-06-02 MED ORDER — INSULIN ASPART 100 UNIT/ML IJ SOLN
0.0000 [IU] | Freq: Three times a day (TID) | INTRAMUSCULAR | Status: DC
  Filled 2021-06-02: qty 1

## 2021-06-02 MED ORDER — INSULIN ASPART 100 UNIT/ML IJ SOLN: 0.0000 [IU] | Freq: Every day | INTRAMUSCULAR | Status: DC

## 2021-06-02 MED ORDER — INSULIN ASPART 100 UNIT/ML IJ SOLN: 0.0000 [IU] | INTRAMUSCULAR | Status: DC

## 2021-06-02 MED ORDER — METOPROLOL TARTRATE 25 MG PO TABS
25.0000 mg | ORAL_TABLET | Freq: Two times a day (BID) | ORAL | Status: DC
  Administered 2021-06-02 – 2021-06-03 (×3): 25 mg via ORAL
  Filled 2021-06-02 (×3): qty 1

## 2021-06-02 MED ORDER — CLOPIDOGREL BISULFATE 75 MG PO TABS
75.0000 mg | ORAL_TABLET | Freq: Every day | ORAL | Status: DC
  Administered 2021-06-02 – 2021-06-03 (×2): 75 mg via ORAL
  Filled 2021-06-02 (×2): qty 1

## 2021-06-02 NOTE — Progress Notes (Signed)
Physical Therapy Treatment ?Patient Details ?Name: Patrick Perez ?MRN: [id number] ?DOB: 1966-02-04 ?Today's Date: 06/02/2021 ? ? ?History of Present Illness 56 year old male with admitted with pneumonia and MRI significant for Acute infarct in the right thalamus. PMH significant diabetes mellitus, depression with anxiety, rheumatoid arthritis, marijuana abuse, who presents with chest pain, shortness breath, numbness in left cheek and left hand and forearm. ? ?  ?PT Comments  ? ? Pt was supine in bed, alert and oriented x 4. He is agreeable to session. Pt was easily and safely able top exit bed, stand, and ambulate without AD. Hr elevated to 130s with RR up to 30s but with standing rest HR quickly lowers and RR returns to normal range. He does not endorse any concerns or discomfort. Overall pt is doing well. Would benefit from outpatient PT at DC to continue to assist pt to PLOF. ?  ?Recommendations for follow up therapy are one component of a multi-disciplinary discharge planning process, led by the attending physician.  Recommendations may be updated based on patient status, additional functional criteria and insurance authorization. ? ?Follow Up Recommendations ? Outpatient PT ?  ?  ?Assistance Recommended at Discharge Set up Supervision/Assistance  ?Patient can return home with the following Assistance with cooking/housework;A little help with bathing/dressing/bathroom;Assist for transportation ?  ?Equipment Recommendations ? None recommended by PT  ?  ?   ?Precautions / Restrictions Precautions ?Precautions: None ?Restrictions ?Weight Bearing Restrictions: No  ?  ? ?Mobility ? Bed Mobility ?Overal bed mobility: Modified Independent ?  ?Transfers ?Overall transfer level: Modified independent ?  ?Ambulation/Gait ?Ambulation/Gait assistance: Supervision ?Gait Distance (Feet): 250 Feet ?Assistive device: None ?Gait Pattern/deviations: WFL(Within Functional Limits) ?Gait velocity: decreased ?  ?  ?General Gait Details:  pt HR elevated to 130s and RR into 30s but pt shows no signs of distress ? ?  ?Balance Overall balance assessment: Modified Independent ?  ?  ?  ?Cognition Arousal/Alertness: Awake/alert ?Behavior During Therapy: [facility] for tasks assessed/performed ?Overall Cognitive Status: Within Functional Limits for tasks assessed ?  ? General Comments: Pt is A and O x 4 ?  ?  ? ?  ?   ?   ? ?Pertinent Vitals/Pain Pain Assessment ?Pain Assessment: 0-10 ?Pain Score: 6  ?Pain Location: L arm/chest discomfort ?Pain Descriptors / Indicators: Discomfort ?Pain Intervention(s): Limited activity within patient's tolerance, Monitored during session, Repositioned, Patient requesting pain meds-RN notified  ? ? ? ?PT Goals (current goals can now be found in the care plan section) Acute Rehab PT Goals ?Patient Stated Goal: get my life back right ?Progress towards PT goals: Progressing toward goals ? ?  ?Frequency ? ? ? 7X/week ? ? ? ?  ?PT Plan Current plan remains appropriate  ? ? ?AM-PAC PT "6 Clicks" Mobility   ?Outcome Measure ? Help needed turning from your back to your side while in a flat bed without using bedrails?: None ?Help needed moving from lying on your back to sitting on the side of a flat bed without using bedrails?: None ?Help needed moving to and from a bed to a chair (including a wheelchair)?: None ?Help needed standing up from a chair using your arms (e.g., wheelchair or bedside chair)?: None ?Help needed to walk in hospital room?: None ?Help needed climbing 3-5 steps with a railing? : A Little ?6 Click Score: 23 ? ?  ?End of Session   ?Activity Tolerance: Patient tolerated treatment well ?Patient left:  (seated EOB with RN present to give medications) ?Nurse  Communication: Mobility status ?PT Visit Diagnosis: Unsteadiness on feet (R26.81);Muscle weakness (generalized) (M62.81);Hemiplegia and hemiparesis ?Hemiplegia - Right/Left: Left ?Hemiplegia - dominant/non-dominant: Non-dominant ?Hemiplegia - caused by: Cerebral  infarction ?  ? ? ?Time: [id number] ?PT Time Calculation (min) (ACUTE ONLY): 15 min ? ?Charges:  $Gait Training: 8-22 mins          ?          ?Julaine Fusi PTA ?06/02/21, 9:22 AM  ? ?

## 2021-06-02 NOTE — Progress Notes (Signed)
Initial Nutrition Assessment ? ?DOCUMENTATION CODES:  ? ?Not applicable ? ?INTERVENTION:  ? ?-Double protein portions with meals ?-MVI with minerals daily ? ?NUTRITION DIAGNOSIS:  ? ?Inadequate oral intake related to social / environmental circumstances as evidenced by per patient/family report. ? ?GOAL:  ? ?Patient will meet greater than or equal to 90% of their needs ? ?MONITOR:  ? ?PO intake, Supplement acceptance, Labs, Weight trends, Skin, I & O's ? ?REASON FOR ASSESSMENT:  ? ?Malnutrition Screening Tool ?  ? ?ASSESSMENT:  ? ?Patrick Perez is a 56 y.o. male with medical history significant of diabetes mellitus, depression with anxiety, rheumatoid arthritis, marijuana abuse, who presents with chest pain, shortness breath, numbness in left cheek and left hand and forearm. ? ?Pt admitted with acute right Thalamic infarct.  ? ?Reviewed I/O's: +1.2 L x 24 hours ? ?Spoke with pt at bedside, who reports feeling fair today. He reports he thinks he has lost about 50# over the past 2 weeks secondary to limited food access and homelessness. He shares his UBW is around 180#.  ? ?Per pt, his appetite has improved since hospitalization. He consumed 100% of his lunch. He denies any chewing or swallowing difficulties. His only complaint is numbness on his left side.  ? ?Reviewed wt hx; pt has experienced a 7.5% wt loss over the past 3 months, which is significant for time frame.  ? ?Medications reviewed and include 0.9% sodium chloride infusion @ 75 ml/hr.  ? ?Lab Results  ?Component Value Date  ? HGBA1C 6.4 (H) 06/01/2021  ? PTA DM medications are 500 mg metformin BID.  ? ?Labs reviewed: Na: 134, CBGS: 110-166 (inpatient orders for glycemic control are 0-5 units insulin aspart daily at bedtime and 0-9 units insulin aspart TID with meals).   ? ?NUTRITION - FOCUSED PHYSICAL EXAM: ? ?Flowsheet Row Most Recent Value  ?Orbital Region No depletion  ?Upper Arm Region No depletion  ?Thoracic and Lumbar Region No depletion  ?Buccal  Region No depletion  ?Temple Region No depletion  ?Clavicle Bone Region No depletion  ?Clavicle and Acromion Bone Region No depletion  ?Scapular Bone Region No depletion  ?Dorsal Hand No depletion  ?Patellar Region No depletion  ?Anterior Thigh Region No depletion  ?Posterior Calf Region No depletion  ?Edema (RD Assessment) None  ?Hair Reviewed  ?Eyes Reviewed  ?Mouth Reviewed  ?Skin Reviewed  ?Nails Reviewed  ? ?  ? ? ?Diet Order:   ?Diet Order   ? ?       ?  Diet regular Room service appropriate? Yes; Fluid consistency: Thin; Fluid restriction: 1200 mL Fluid  Diet effective now       ?  ? ?  ?  ? ?  ? ? ?EDUCATION NEEDS:  ? ?Education needs have been addressed ? ?Skin:  Skin Assessment: Reviewed RN Assessment ? ?Last BM:  06/01/21 ? ?Height:  ? ?Ht Readings from Last 1 Encounters:  ?06/01/21 5\' 8"  (1.727 m)  ? ? ?Weight:  ? ?Wt Readings from Last 1 Encounters:  ?06/01/21 83.9 kg  ? ? ?Ideal Body Weight:  70 kg ? ?BMI:  Body mass index is 28.13 kg/m?. ? ?Estimated Nutritional Needs:  ? ?Kcal:  1900-2100 ? ?Protein:  90-105 grams ? ?Fluid:  > 1.9 L ? ? ? ?Levada Schilling, RD, LDN, CDCES ?Registered Dietitian II ?Certified Diabetes Care and Education Specialist ?Please refer to [facility] for RD and/or RD on-call/weekend/after hours pager  ?

## 2021-06-02 NOTE — Clinical Social Work Note (Signed)
Occupational Therapy * Physical Therapy * Speech Therapy ?       ? ? ?DATE __4/20/23_________________ ?PATIENT NAME___[name]_______ ?PATIENT MRN___[id number]__________ ? ?DIAGNOSIS/DIAGNOSIS CODE ___Stroke I63.9___________ ?DATE OF DISCHARGE: ___04/20/23___________ ? ?PRIMARY CARE PHYSICIAN ___Deanna Yetta BarreJones _______________________ ?PCP PHONE/FAX_____[phone]______ ? ?  ? ?Dear Provider (Name: __________________   ?Fax: ___________________________): ?  ?I certify that I have examined this patient and that occupational/physical/speech therapy is necessary on an outpatient basis.   ? ?The patient has expressed interest in completing their recommended course of therapy at your location.  Once a formal order from the patient's primary care physician has been obtained, please contact him/her to schedule an appointment for evaluation at your earliest convenience. ? ? ?[ X ]  Physical Therapy Evaluate and Treat ? [ X ]  Occupational Therapy Evaluate and Treat ?[  ]  Speech Therapy Evaluate and Treat ? ? ? ? ? ? ?The patient's primary care physician (listed above) must furnish and be responsible for a formal order such that the recommended services may be furnished while under the primary physician's care, and that the plan of care will be established and reviewed every 30 days (or more often if condition necessitates).  ? ?MD electronic signature noted below ?

## 2021-06-02 NOTE — Telephone Encounter (Signed)
I returned pt's call. He is in hospital s/p stroke. Has left side facial drooping and left arm weakness. I advised him to speak to social worker while in hospital to see if maybe they can help him get into a housing situation. I had called and talked to [facility] with Mike Craze previously and he was moved up 2 places on list. Maybe the hospital can try to get him moved up as well. I asked that he call us back and let us know what he is told by Education officer, museum and if needed I will call Baker Janus back as well. ?

## 2021-06-02 NOTE — Progress Notes (Signed)
Occupational Therapy Treatment ?Patient Details ?Name: Patrick Perez ?MRN: [id number] ?DOB: [birth year] ?Today's Date: 06/02/2021 ? ? ?History of present illness 56 year old male with admitted with pneumonia and MRI significant for Acute infarct in the right thalamus. PMH significant diabetes mellitus, depression with anxiety, rheumatoid arthritis, marijuana abuse, who presents with chest pain, shortness breath, numbness in left cheek and left hand and forearm. ?  ?OT comments ? Chart reviewed, pt greeted in bed agreeable to OT tx session. Tx session targeted HEP review. Hand out provided re: [facility] and AROM HEP for LUE to address deficits. Pt with good understanding and demo. Pt declines further mobility on this date. Pt left as received, NAD, all needs met. OT will continue to follow while admitted.   ? ?Recommendations for follow up therapy are one component of a multi-disciplinary discharge planning process, led by the attending physician.  Recommendations may be updated based on patient status, additional functional criteria and insurance authorization. ?   ?Follow Up Recommendations ? Outpatient OT  ?  ?Assistance Recommended at Discharge Intermittent Supervision/Assistance  ?Patient can return home with the following ? Assistance with cooking/housework;Assist for transportation ?  ?Equipment Recommendations ? None recommended by OT  ?  ?Recommendations for Other Services   ? ?  ?Precautions / Restrictions    ? ? ?  ? ?Mobility Bed Mobility ?  ?  ?  ?  ?  ?  ?  ?  ?  ? ?Transfers ?  ?  ?  ?  ?  ?  ?  ?  ?  ?  ?  ?  ?Balance   ?  ?  ?  ?  ?  ?  ?  ?  ?  ?  ?  ?  ?  ?  ?  ?  ?  ?  ?   ? ?ADL either performed or assessed with clinical judgement  ? ?ADL Overall ADL's : Needs assistance/impaired ?  ?  ?  ?  ?  ?  ?  ?  ?  ?  ?  ?  ?  ?  ?  ?  ?  ?  ?  ?General ADL Comments: supine>sit with MOD I ?  ? ?Extremity/Trunk Assessment Upper Extremity Assessment ?LUE Deficits / Details: improved compared to evaluation- pt able to  make a fist and to perform finger opposition with increased time. Weakness noted throughout. ?LUE Sensation: decreased light touch;decreased proprioception ?  ?  ?  ?  ?  ? ?Vision   ?  ?  ?Perception   ?  ?Praxis   ?  ? ?Cognition Arousal/Alertness: Awake/alert ?Behavior During Therapy: [facility] for tasks assessed/performed ?Overall Cognitive Status: Within Functional Limits for tasks assessed ?  ?  ?  ?  ?  ?  ?  ?  ?  ?  ?  ?  ?  ?  ?  ?  ?  ?  ?  ?   ?Exercises Hand Exercises ?Forearm Supination: AROM ?Forearm Pronation: AROM ?Wrist Flexion: AROM ?Wrist Extension: AROM ?Digit Composite Flexion: AROM ?Composite Extension: AROM ?Opposition: AROM ?Other Exercises ?Other Exercises: edu re: HEP and compensatory strategies for ease of ADL completion ? ?  ?Shoulder Instructions   ? ? ?  ?General Comments    ? ? ?Pertinent Vitals/ Pain       Pain Assessment ?Pain Assessment: Faces ?Faces Pain Scale: Hurts even more ?Pain Location: generalized, L chest ?Pain Descriptors / Indicators: Guarding, Grimacing ?Pain Intervention(s): Limited  activity within patient's tolerance, Monitored during session, Repositioned ? ?Home Living   ?  ?  ?  ?  ?  ?  ?  ?  ?  ?  ?  ?  ?  ?  ?  ?  ?  ?  ? ?  ?Prior Functioning/Environment    ?  ?  ?  ?   ? ?Frequency ? Min 3X/week  ? ? ? ? ?  ?Progress Toward Goals ? ?OT Goals(current goals can now be found in the care plan section) ? Progress towards OT goals: Progressing toward goals ? ?Acute Rehab OT Goals ?Patient Stated Goal: discharge from hospital ?OT Goal Formulation: With patient ?Time For Goal Achievement: 06/16/21 ?Potential to Achieve Goals: Good  ?Plan Discharge plan remains appropriate   ? ?Co-evaluation ? ? ?   ?  ?  ?  ?  ? ?  ?AM-PAC OT "6 Clicks" Daily Activity     ?Outcome Measure ? ? Help from another person eating meals?: None ?Help from another person taking care of personal grooming?: None ?Help from another person toileting, which includes using toliet, bedpan, or urinal?:  None ?Help from another person bathing (including washing, rinsing, drying)?: A Little ?Help from another person to put on and taking off regular upper body clothing?: None ?Help from another person to put on and taking off regular lower body clothing?: None ?6 Click Score: 23 ? ?  ?End of Session   ? ?OT Visit Diagnosis: Unsteadiness on feet (R26.81);Other symptoms and signs involving the nervous system (R29.898) ?  ?Activity Tolerance Patient tolerated treatment well ?  ?Patient Left in bed;with call bell/phone within reach ?  ?Nurse Communication Mobility status ?  ? ?   ? ?Time: 1219-7588 ?OT Time Calculation (min): 13 min ? ?Charges: OT General Charges ?$OT Visit: 1 Visit ?OT Treatments ?$Therapeutic Exercise: 8-22 mins ? ?Shanon Payor, OTD OTR/L  ?06/02/21, 3:25 PM  ?

## 2021-06-02 NOTE — Progress Notes (Signed)
?   06/01/21 2111  ?Assess: MEWS Score  ?Temp 99.9 ?F (37.7 ?C)  ?BP (!) 138/92  ?Pulse Rate (!) 114  ?Resp 19  ?Level of Consciousness Alert  ?SpO2 98 %  ?O2 Device Room Air  ?Assess: MEWS Score  ?MEWS Temp 0  ?MEWS Systolic 0  ?MEWS Pulse 2  ?MEWS RR 0  ?MEWS LOC 0  ?MEWS Score 2  ?MEWS Score Color Yellow  ?Assess: if the MEWS score is Yellow or Red  ?Were vital signs taken at a resting state? Yes  ?Focused Assessment No change from prior assessment  ?Does the patient meet 2 or more of the SIRS criteria? Yes  ?Does the patient have a confirmed or suspected source of infection? Yes  ?Provider and Rapid Response Notified? No  ?MEWS guidelines implemented *See Row Information* Yes  ?Treat  ?MEWS Interventions Escalated (See documentation below)  ?Pain Scale 0-10  ?Pain Score 9  ?Pain Type Acute pain  ?Pain Location Rib cage  ?Pain Orientation Left  ?Pain Descriptors / Indicators Sharp  ?Pain Onset Other (Comment) ?(deep breaths)  ?Patients Stated Pain Goal 7  ?Pain Intervention(s) Other (Comment) ?(scheduled meds)  ?Neuro symptoms relieved by Anti-anxiety medication  ?Take Vital Signs  ?Increase Vital Sign Frequency  Yellow: Q 2hr X 2 then Q 4hr X 2, if remains yellow, continue Q 4hrs  ?Escalate  ?MEWS: Escalate Yellow: discuss with charge nurse/RN and consider discussing with provider and RRT  ?Notify: Charge Nurse/RN  ?Name of Charge Nurse/RN Notified Kennyth Lose RN  ?Date Charge Nurse/RN Notified 06/01/21  ?Time Charge Nurse/RN Notified 2217  ?Assess: SIRS CRITERIA  ?SIRS Temperature  0  ?SIRS Pulse 1  ?SIRS Respirations  0  ?SIRS WBC 1  ?SIRS Score Sum  2  ? ? ?

## 2021-06-02 NOTE — Telephone Encounter (Signed)
Copied from CRM [id number]. Topic: General - Other ?>> Jun 02, 2021  9:53 AM Payton Spark N wrote: ?Reason for CRM: Pt called in wanting to speak with Delice Bison stating he has pneumonia and has had a stroke, pt is currently in the ER, and stated he needed to speak with her. Please advise. ?

## 2021-06-02 NOTE — Progress Notes (Signed)
Cross Cover ?Melatonin ordered as request for sleep medicine ? ?

## 2021-06-02 NOTE — Progress Notes (Signed)
[facility]  - [place] [facility] ? ? ?PATIENT NAME: Patrick Perez   ? ?MR#:  [id number] ? ?DATE OF BIRTH:  [birth year] ? ?SUBJECTIVE:  ?patient came in with facial numbness and left upper extremity numbness. Some shortness of breath. Cough. No fever. Overall feels better. Was little bit tachycardic earlier. No family at bedside. Found to have CVA and pneumonia. ? ?VITALS:  ?Blood pressure (!) 131/96, pulse (!) 106, temperature (!) 97.5 ?F (36.4 ?C), temperature source Oral, resp. rate 19, height  (1.727 m), weight 83.9 kg, SpO2 97 %. ? ?PHYSICAL EXAMINATION:  ? ?GENERAL:  56 y.o.-year-old patient lying in the bed with no acute distress.  ?LUNGS: Normal breath sounds bilaterally, no wheezing, rales, rhonchi.  ?CARDIOVASCULAR: S1, S2 normal. No murmurs, rubs, or gallops. Tachycardia+ ?ABDOMEN: Soft, nontender, nondistended. Bowel sounds present.  ?EXTREMITIES: DJD with swelling fingers    ?NEUROLOGIC: nonfocal  patient is alert and awake ?SKIN: No obvious rash, lesion, or ulcer.  ? ?LABORATORY PANEL:  ?CBC ?Recent Labs  ?Lab 06/02/21 ?0405  ?WBC 10.7*  ?HGB 10.8*  ?HCT 33.7*  ?PLT 367  ? ? ?Chemistries  ?Recent Labs  ?Lab 06/01/21 ?0920 06/01/21 ?1214 06/02/21 ?0405  ?NA 128*   < > 134*  ?K 4.2   < > 4.3  ?CL 95*   < > 102  ?CO2 26   < > 25  ?GLUCOSE 136*   < > 109*  ?BUN 7   < > 7  ?CREATININE 0.77   < > 0.76  ?CALCIUM 9.0   < > 8.7*  ?AST 57*  --   --   ?ALT 51*  --   --   ?ALKPHOS 198*  --   --   ?BILITOT 0.8  --   --   ? < > = values in this interval not displayed.  ? ?Cardiac Enzymes ?No results for input(s): TROPONINI in the last 168 hours. ?RADIOLOGY:  ?CT HEAD WO CONTRAST ( ) ? ?Result Date: 06/01/2021 ?CLINICAL DATA:  Acute left-sided facial and left upper extremity numbness beginning yesterday. EXAM: CT HEAD WITHOUT CONTRAST TECHNIQUE: Contiguous axial images were obtained from the base of the skull through the vertex without intravenous contrast. RADIATION DOSE REDUCTION: This exam  was performed according to the departmental dose-optimization program which includes automated exposure control, adjustment of the mA and/or kV according to patient size and/or use of iterative reconstruction technique. COMPARISON:  None. FINDINGS: Brain: No evidence of intracranial hemorrhage, hydrocephalus, extra-axial collection, or mass lesion/mass effect. A small poorly defined area of decreased attenuation is seen involving the right thalamus and posterior limb of the internal capsule, suspicious for a lacunar infarct which may be acute or subacute in age. Vascular:  No hyperdense vessel or other acute findings. Skull: No evidence of fracture or other significant bone abnormality. Sinuses/Orbits:  No acute findings. Other: None. IMPRESSION: Suspected lacunar infarct involving the right thalamus and posterior limb of the internal capsule, which may be acute or subacute in age. Consider brain MRI for further evaluation if clinically warranted. Electronically Signed   By: Danae Orleans M.D.   On: 06/01/2021 10:30  ? ?CT Angio Chest PE W and/or Wo Contrast ? ?Result Date: 06/01/2021 ?CLINICAL DATA:  Left-sided chest pain for several days. Dyspnea. High clinical suspicion for pulmonary embolism. EXAM: CT ANGIOGRAPHY CHEST WITH CONTRAST TECHNIQUE: Multidetector CT imaging of the chest was performed using the standard protocol during bolus administration of intravenous contrast. Multiplanar CT image reconstructions and MIPs were  obtained to evaluate the vascular anatomy. RADIATION DOSE REDUCTION: This exam was performed according to the departmental dose-optimization program which includes automated exposure control, adjustment of the mA and/or kV according to patient size and/or use of iterative reconstruction technique. CONTRAST:  50mL OMNIPAQUE IOHEXOL 350 MG/ML SOLN COMPARISON:  None. FINDINGS: Cardiovascular: Satisfactory opacification of pulmonary arteries noted, and no pulmonary emboli identified. No evidence of  thoracic aortic dissection or aneurysm. Mediastinum/Nodes: Mild mediastinal lymphadenopathy is seen in the right paratracheal, lateral aortic and subcarinal regions, with largest lymph node in the right paratracheal region measuring 1.3 cm. Mildly enlarged right hilar lymph node is seen measuring 1.8 cm short axis. Lungs/Pleura: Multifocal ill-defined confluent nodular opacities ovarian size are seen in both lungs, with predominant involvement of the right lower lobe and left upper lobe. Largest opacity is seen in the anterior left upper lobe measuring 2.5 x 1.7 cm. Mild left-sided pleural thickening is seen, however there is no evidence of pleural effusion. Upper abdomen: Diffuse hepatic steatosis noted. Musculoskeletal: No suspicious bone lesions identified. Review of the MIP images confirms the above findings. IMPRESSION: No evidence of pulmonary embolism. Multifocal ill-defined confluent nodular opacities throughout both lungs, with predominant involvement of right lower lobe and left upper lobe, most likely inflammatory or infectious in etiology. Recommend short-term follow-up by CT in 1-2 months. Mild mediastinal and right hilar lymphadenopathy, which is nonspecific and may be reactive in etiology. Continued attention also recommended on follow-up CT. Diffuse hepatic steatosis. Electronically Signed   By: Danae OrleansJohn A Stahl M.D.   On: 06/01/2021 10:27  ? ?MR ANGIO HEAD WO CONTRAST ? ?Result Date: 06/01/2021 ?CLINICAL DATA:  Numbness in left hand, stroke suspected EXAM: MRI HEAD WITHOUT CONTRAST MRA HEAD WITHOUT CONTRAST MRA NECK WITHOUT AND WITH CONTRAST TECHNIQUE: Multiplanar, multi-echo pulse sequences of the brain and surrounding structures were acquired without intravenous contrast. Angiographic images of the Circle of Willis were acquired using MRA technique without intravenous contrast. Angiographic images of the neck were acquired using MRA technique without and with intravenous contrast. Carotid stenosis  measurements (when applicable) are obtained utilizing NASCET criteria, using the distal internal carotid diameter as the denominator. CONTRAST:  7.15mL GADAVIST GADOBUTROL 1 MMOL/ML IV SOLN COMPARISON:  No prior MRI, correlation is made with CT head 06/01/2021 FINDINGS: MRI HEAD FINDINGS Brain: Restricted diffusion with ADC correlate in the right thalamus (series 5, image 85 and series 6, image 26), measuring approximately 1.1 x 0.7 cm. No additional foci of restricted diffusion are seen. No acute hemorrhage, mass, mass effect, or midline shift. No hydrocephalus or extra-axial collection. Vascular: Please see MRA findings below. Skull and upper cervical spine: Normal marrow signal. Sinuses/Orbits: Mild mucosal thickening in the ethmoid air cells. The orbits are unremarkable. Other: The mastoids are well aerated. MRA HEAD FINDINGS Anterior circulation: Both internal carotid arteries are patent to the termini, without significant stenosis. A1 segments patent. Normal anterior communicating artery. Anterior cerebral arteries are patent to their distal aspects. No M1 stenosis or occlusion. Normal MCA bifurcations. Distal MCA branches perfused and symmetric. Posterior circulation: Vertebral arteries patent to the vertebrobasilar junction without stenosis. Posterior inferior cerebral arteries patent bilaterally. Basilar patent to its distal aspect. Superior cerebellar arteries patent bilaterally. Patent P1 segments. PCAs perfused to their distal aspects without stenosis. The bilateral posterior communicating arteries are visualized. Anatomic variants: None significant MRA NECK FINDINGS Common, internal, and external carotid arteries are patent, without hemodynamically significant stenosis. Extracranial vertebral arteries are patent, without hemodynamically significant stenosis. Other: None IMPRESSION: 1. Acute infarct  in the right thalamus. 2. No intracranial large vessel occlusion or significant stenosis. 3. No  hemodynamically significant stenosis in the neck. These results were called by telephone at the time of interpretation on 06/01/2021 at 1:22 pm to provider NIU, who verbally acknowledged these results. Electronically

## 2021-06-02 NOTE — Care Management Important Message (Signed)
Important Message ? ?Patient Details  ?Name: Patrick HsuMichael Cross ?MRN: [id number] ?Date of Birth: 07-Jul-1965 ? ? ?Medicare Important Message Given:  N/A - LOS <3 / Initial given by admissions ? ? ? ? ?Johnell ComingsHolly Wilmer Berryhill ?06/02/2021, 4:41 PM ?

## 2021-06-02 NOTE — Telephone Encounter (Signed)
I spoke to Patrick Perez, who will follow up with Baker Janus and patient concerning getting into [facility] for a better living condition. ?

## 2021-06-03 ENCOUNTER — Telehealth: Payer: Self-pay | Admitting: Family Medicine

## 2021-06-03 DIAGNOSIS — E119 Type 2 diabetes mellitus without complications: Secondary | ICD-10-CM

## 2021-06-03 DIAGNOSIS — I639 Cerebral infarction, unspecified: Secondary | ICD-10-CM | POA: Diagnosis not present

## 2021-06-03 DIAGNOSIS — J189 Pneumonia, unspecified organism: Secondary | ICD-10-CM | POA: Diagnosis not present

## 2021-06-03 DIAGNOSIS — F418 Other specified anxiety disorders: Secondary | ICD-10-CM | POA: Diagnosis not present

## 2021-06-03 LAB — GLUCOSE, CAPILLARY: Glucose-Capillary: 127 mg/dL — ABNORMAL HIGH (ref 70–99)

## 2021-06-03 MED ORDER — ATORVASTATIN CALCIUM 40 MG PO TABS: 40.0000 mg | ORAL_TABLET | Freq: Every day | ORAL | 1 refills | Status: DC

## 2021-06-03 MED ORDER — GABAPENTIN 300 MG PO CAPS: 300.0000 mg | ORAL_CAPSULE | Freq: Every day | ORAL | 1 refills | Status: AC

## 2021-06-03 MED ORDER — CLOPIDOGREL BISULFATE 75 MG PO TABS: 75.0000 mg | ORAL_TABLET | Freq: Every day | ORAL | 0 refills | Status: DC

## 2021-06-03 MED ORDER — AMOXICILLIN-POT CLAVULANATE 875-125 MG PO TABS
1.0000 | ORAL_TABLET | Freq: Two times a day (BID) | ORAL | Status: DC
  Administered 2021-06-03: 1 via ORAL
  Filled 2021-06-03: qty 1

## 2021-06-03 MED ORDER — AMOXICILLIN-POT CLAVULANATE 875-125 MG PO TABS: 1.0000 | ORAL_TABLET | Freq: Two times a day (BID) | ORAL | 0 refills | Status: AC

## 2021-06-03 MED ORDER — METFORMIN HCL 500 MG PO TABS: 500.0000 mg | ORAL_TABLET | Freq: Two times a day (BID) | ORAL | 1 refills | Status: DC

## 2021-06-03 MED ORDER — METOPROLOL TARTRATE 25 MG PO TABS: 25.0000 mg | ORAL_TABLET | Freq: Two times a day (BID) | ORAL | 1 refills | Status: DC

## 2021-06-03 MED ORDER — CYCLOBENZAPRINE HCL 10 MG PO TABS: 10.0000 mg | ORAL_TABLET | Freq: Every day | ORAL | 0 refills | Status: DC

## 2021-06-03 MED ORDER — ASPIRIN 81 MG PO CHEW: 81.0000 mg | CHEWABLE_TABLET | Freq: Every day | ORAL | 3 refills | Status: DC

## 2021-06-03 NOTE — Progress Notes (Signed)
Reviewed  MD order to discharge patient to home.  Reviewed discharge instructions follow up appointments, prescriptions,  and home  meds with patient and patient verbalized understanding  ?

## 2021-06-03 NOTE — TOC Progression Note (Addendum)
Transition of Care (TOC) - Progression Note  ? ? ?Patient Details  ?Name: Elyse HsuMichael Mcguffee ?MRN: [id number] ?Date of Birth: 1965/05/08 ? ?Transition of Care (TOC) CM/SW Contact  ?Chaka Boyson A Kenichi Cassada, LCSW ?Phone Number: ?06/03/2021, 9:50 AM ? ?Clinical Narrative:   CSW [place] [facility] [place] to see if pt could be moved up on waiting list. Left a voicemail will front office. Awaiting a call back. [facility] to [facility] in [place]. ? ? ? ?  ?  ? ?Expected Discharge Plan and Services ?  ?  ?  ?  ?  ?                ?  ?  ?  ?  ?  ?  ?  ?  ?  ?  ? ? ?Social Determinants of Health (SDOH) Interventions ?  ? ?Readmission Risk Interventions ?   ? View : No data to display.  ?  ?  ?  ? ? ?

## 2021-06-03 NOTE — Discharge Summary (Signed)
?Physician Discharge Summary ?  ?Patient: Patrick Perez MRN: [id number] DOB: 1965/09/12  ?Admit date:     06/01/2021  ?Discharge date: 06/03/21  ?Discharge Physician: Enedina Finner  ? ?PCP: Duanne Limerick, MD  ? ?Recommendations at discharge:  ? ? F/u Dr Trisha Mangle in 1-2 weeks ?Keep your appt for [facility] rheumatology in November 2023 ? ?Discharge Diagnoses: ?Acute right thalamic stroke ?community acquired pneumonia ? ?Hospital Course: ? ?Patrick Perez is a 56 y.o. male with medical history significant of diabetes mellitus, depression with anxiety, rheumatoid arthritis, marijuana abuse, who presents with chest pain, shortness breath, numbness in left cheek and left hand and forearm. ?  ?Patient states that he has chest pain and shortness of breath for almost a week.  He has cough with dark-colored mucus production.  Denies fever or chills.  ?  ?CT scan of head: ?Suspected lacunar infarct involving the right thalamus and posterior ?limb of the internal capsule, which may be acute or subacute in age. ?Consider brain MRI for further evaluation if clinically warranted. ?  ?MRI-brain and MRA of neck and head ?1. Acute infarct in the right thalamus. ?2. No intracranial large vessel occlusion or significant stenosis. ?3. No hemodynamically significant stenosis in the neck. ?  ?Acute right Thalamic infarct ?-- came in with left cheek left forearm numbness ?-- MRI positive for stroke ?-- seen by neurology Dr. Otelia Limes. Recommends aspiri plus Plavix for 21 days and then continue aspirin indefinitely ?-- continue statins ?-- outpatient PT OT ?-- echo of the heart reviewed ?  ?Sepsis secondary to community acquired pneumonia--POA ?-- came in with shortness of breath, tachycardia, elevated white count, elevated Pro calcitonin and CT chest showing multifocal infiltration ?-- IV Rocephin and Zithromax--change to oral Augmenting, prn bronchodilators ?-- blood culture negative ?--white count normal ?-- patient afebrile ?-- sepsis improved ?   ?Depression with anxiety ?-- PRN vistaril ?  ?Hypertension ?mild tachycardia ?-- metoprolol 25 BID ?  ?type II diabetes without complication ?-- A1c 7.1 ?-- will resume metformin at discharge ?  ?rheumatoid arthritis ?-- patient used to be on methotrexate. Currently not taking this medicine. ?-- Patient tells me he has follow-up appointment with [facility] rheumatology November ?  ?discharge today. Patient in agreement ? ?Procedures: none ?Family communication : none ?Consults : neurology ?CODE STATUS: full code DVT Prophylaxis : Lovenox ?  ? ? ? ?Disposition: Home ?Diet recommendation:  ?Discharge Diet Orders (From admission, onward)  ? ?  Start     Ordered  ? 06/03/21 0000  Diet - low sodium heart healthy       ? 06/03/21 1017  ? 06/03/21 0000  Diet Carb Modified       ? 06/03/21 1017  ? ?  ?  ? ?  ? ?Cardiac and Carb modified diet ?DISCHARGE MEDICATION: ?Allergies as of 06/03/2021   ? ?   Reactions  ? Oxycodone Hives  ? Oxycodone-acetaminophen Anxiety  ? ?  ? ?  ?Medication List  ?  ? ?STOP taking these medications   ? ?hydrOXYzine 25 MG capsule ?Commonly known as: VISTARIL ?  ? ?  ? ?TAKE these medications   ? ?amoxicillin-clavulanate 875-125 MG tablet ?Commonly known as: AUGMENTIN ?Take 1 tablet by mouth every 12 (twelve) hours for 5 days. ?  ?aspirin 81 MG chewable tablet ?Chew 1 tablet (81 mg total) by mouth daily. ?Start taking on: June 04, 2021 ?  ?atorvastatin 40 MG tablet ?Commonly known as: LIPITOR ?Take 1 tablet (40 mg total) by mouth  daily. ?Start taking on: June 04, 2021 ?  ?clopidogrel 75 MG tablet ?Commonly known as: PLAVIX ?Take 1 tablet (75 mg total) by mouth daily. ?Start taking on: June 04, 2021 ?  ?clotrimazole-betamethasone cream ?Commonly known as: LOTRISONE ?Apply 1 application topically daily. ?  ?cyclobenzaprine 10 MG tablet ?Commonly known as: FLEXERIL ?Take 1 tablet (10 mg total) by mouth at bedtime. Dr Alfredo Batty ?  ?gabapentin 300 MG capsule ?Commonly known as: NEURONTIN ?Take 1 capsule (300  mg total) by mouth at bedtime. Dr Alfredo Batty ?  ?metFORMIN 500 MG tablet ?Commonly known as: GLUCOPHAGE ?Take 1 tablet (500 mg total) by mouth 2 (two) times daily with a meal. ?  ?metoprolol tartrate 25 MG tablet ?Commonly known as: LOPRESSOR ?Take 1 tablet (25 mg total) by mouth 2 (two) times daily. ?  ?Naproxen Sodium 220 MG Caps ?Aleve 220 mg capsule ? prn ?  ?OneTouch Delica Lancets 33G Misc ?USE TO TEST ONCE DAILY ?  ?OneTouch Ultra test strip ?Generic drug: glucose blood ?USE TO TEST ONCE DAILY ?  ? ?  ? ? ?Discharge Exam: ?Filed Weights  ? 06/01/21 1610  ?Weight: 83.9 kg  ? ? ? ?Condition at discharge: fair ? ?The results of significant diagnostics from this hospitalization (including imaging, microbiology, ancillary and laboratory) are listed below for reference.  ? ?Imaging Studies: ?CT HEAD WO CONTRAST ( ) ? ?Result Date: 06/01/2021 ?CLINICAL DATA:  Acute left-sided facial and left upper extremity numbness beginning yesterday. EXAM: CT HEAD WITHOUT CONTRAST TECHNIQUE: Contiguous axial images were obtained from the base of the skull through the vertex without intravenous contrast. RADIATION DOSE REDUCTION: This exam was performed according to the departmental dose-optimization program which includes automated exposure control, adjustment of the mA and/or kV according to patient size and/or use of iterative reconstruction technique. COMPARISON:  None. FINDINGS: Brain: No evidence of intracranial hemorrhage, hydrocephalus, extra-axial collection, or mass lesion/mass effect. A small poorly defined area of decreased attenuation is seen involving the right thalamus and posterior limb of the internal capsule, suspicious for a lacunar infarct which may be acute or subacute in age. Vascular:  No hyperdense vessel or other acute findings. Skull: No evidence of fracture or other significant bone abnormality. Sinuses/Orbits:  No acute findings. Other: None. IMPRESSION: Suspected lacunar infarct involving the right  thalamus and posterior limb of the internal capsule, which may be acute or subacute in age. Consider brain MRI for further evaluation if clinically warranted. Electronically Signed   By: Danae Orleans M.D.   On: 06/01/2021 10:30  ? ?CT Angio Chest PE W and/or Wo Contrast ? ?Result Date: 06/01/2021 ?CLINICAL DATA:  Left-sided chest pain for several days. Dyspnea. High clinical suspicion for pulmonary embolism. EXAM: CT ANGIOGRAPHY CHEST WITH CONTRAST TECHNIQUE: Multidetector CT imaging of the chest was performed using the standard protocol during bolus administration of intravenous contrast. Multiplanar CT image reconstructions and MIPs were obtained to evaluate the vascular anatomy. RADIATION DOSE REDUCTION: This exam was performed according to the departmental dose-optimization program which includes automated exposure control, adjustment of the mA and/or kV according to patient size and/or use of iterative reconstruction technique. CONTRAST:  50mL OMNIPAQUE IOHEXOL 350 MG/ML SOLN COMPARISON:  None. FINDINGS: Cardiovascular: Satisfactory opacification of pulmonary arteries noted, and no pulmonary emboli identified. No evidence of thoracic aortic dissection or aneurysm. Mediastinum/Nodes: Mild mediastinal lymphadenopathy is seen in the right paratracheal, lateral aortic and subcarinal regions, with largest lymph node in the right paratracheal region measuring 1.3 cm. Mildly enlarged right hilar lymph node is  seen measuring 1.8 cm short axis. Lungs/Pleura: Multifocal ill-defined confluent nodular opacities ovarian size are seen in both lungs, with predominant involvement of the right lower lobe and left upper lobe. Largest opacity is seen in the anterior left upper lobe measuring 2.5 x 1.7 cm. Mild left-sided pleural thickening is seen, however there is no evidence of pleural effusion. Upper abdomen: Diffuse hepatic steatosis noted. Musculoskeletal: No suspicious bone lesions identified. Review of the MIP images  confirms the above findings. IMPRESSION: No evidence of pulmonary embolism. Multifocal ill-defined confluent nodular opacities throughout both lungs, with predominant involvement of right lower lobe and left up

## 2021-06-03 NOTE — Telephone Encounter (Signed)
Copied from CRM [id number]. Topic: General - Other ?>> Jun 03, 2021 10:54 AM Jaquita Rector A wrote: ?Reason for CRM: [facility] called in to inform Dr Yetta Barre and nurse that they received the referral and to say that they do physical therapy but does not do occupational therapy. Please call Cala Bradford at Ph#  [phone] ?

## 2021-06-04 LAB — CULTURE, RESPIRATORY W GRAM STAIN
Culture: NORMAL
Gram Stain: NONE SEEN

## 2021-06-06 ENCOUNTER — Telehealth: Payer: Self-pay

## 2021-06-06 LAB — CULTURE, BLOOD (ROUTINE X 2)
Culture: NO GROWTH
Culture: NO GROWTH
Special Requests: ADEQUATE
Special Requests: ADEQUATE

## 2021-06-06 NOTE — Telephone Encounter (Signed)
Pt called in wanting to speak with Delice Bison about some new information and concerns he is having, pt requested a call back, please advise.  ?

## 2021-06-06 NOTE — Telephone Encounter (Signed)
Transition Care Management Follow-up Telephone Call ?Date of discharge and from where: 06/03/21 ?How have you been since you were released from the hospital? Pt c/o weight loss - states approx 25 lbs in the last 2 weeks, still having some breathing trouble. Pt c/o "blood bubbles" on multiple parts of his body. Pt still has left side weakness and numbness and c/o muscle atrophy. Pt advised to discuss with Dr. Yetta Barre.  ?Any questions or concerns? No ? ?Items Reviewed: ?Did the pt receive and understand the discharge instructions provided? Yes  ?Medications obtained and verified? Yes  ?Other? No  ?Any new allergies since your discharge? No  ?Dietary orders reviewed? Yes ?Do you have support at home? Yes  ? ?Home Care and Equipment/Supplies: ?Were home health services ordered? No - pt had outpatient PT/OT [facility]. Pt declines PT and OT.  ?  ? ?Were any new equipment or medical supplies ordered?  No ? ? ?Functional Questionnaire: (I = Independent and D = Dependent) ?ADLs: I ? ?Bathing/Dressing- I ? ?Meal Prep- I ? ?Eating- I ? ?Maintaining continence- i ? ?Transferring/Ambulation- I  ? ?Managing Meds- I ? ?Follow up appointments reviewed: ? ?PCP Hospital f/u appt confirmed? Yes  Scheduled to see Dr. Yetta Barre on 06/09/21 @ 10:00. ?Are transportation arrangements needed? No  ?If their condition worsens, is the pt aware to call PCP or go to the Emergency Dept.? Yes ?Was the patient provided with contact information for the PCP's office or ED? Yes ?Was to pt encouraged to call back with questions or concerns? Yes ? ?

## 2021-06-07 LAB — LEGIONELLA PNEUMOPHILA SEROGP 1 UR AG: L. pneumophila Serogp 1 Ur Ag: NEGATIVE

## 2021-06-08 ENCOUNTER — Ambulatory Visit: Admit: 2021-06-08 | Discharge: 2021-06-17 | Payer: MEDICARE

## 2021-06-08 ENCOUNTER — Ambulatory Visit: Admit: 2021-06-08 | Payer: MEDICARE

## 2021-06-08 ENCOUNTER — Ambulatory Visit
Admit: 2021-06-08 | Discharge: 2021-06-17 | Disposition: A | Discharge status: Rehabilitation Facility | Payer: MEDICARE | Admitting: Neurology

## 2021-06-08 ENCOUNTER — Encounter: Admit: 2021-06-08 | Payer: MEDICARE | Attending: Emergency Medicine

## 2021-06-08 ENCOUNTER — Telehealth: Payer: Self-pay | Admitting: Family Medicine

## 2021-06-08 DIAGNOSIS — Z7984 Long term (current) use of oral hypoglycemic drugs: Secondary | ICD-10-CM | POA: Diagnosis not present

## 2021-06-08 DIAGNOSIS — G936 Cerebral edema: Secondary | ICD-10-CM | POA: Diagnosis not present

## 2021-06-08 DIAGNOSIS — M6281 Muscle weakness (generalized): Secondary | ICD-10-CM | POA: Diagnosis not present

## 2021-06-08 DIAGNOSIS — F17210 Nicotine dependence, cigarettes, uncomplicated: Secondary | ICD-10-CM | POA: Diagnosis not present

## 2021-06-08 DIAGNOSIS — R531 Weakness: Secondary | ICD-10-CM | POA: Diagnosis not present

## 2021-06-08 DIAGNOSIS — J189 Pneumonia, unspecified organism: Secondary | ICD-10-CM | POA: Diagnosis not present

## 2021-06-08 DIAGNOSIS — Z20822 Contact with and (suspected) exposure to covid-19: Secondary | ICD-10-CM | POA: Diagnosis present

## 2021-06-08 DIAGNOSIS — Z7982 Long term (current) use of aspirin: Secondary | ICD-10-CM | POA: Diagnosis not present

## 2021-06-08 DIAGNOSIS — R4781 Slurred speech: Secondary | ICD-10-CM | POA: Diagnosis not present

## 2021-06-08 DIAGNOSIS — R1032 Left lower quadrant pain: Secondary | ICD-10-CM | POA: Diagnosis not present

## 2021-06-08 DIAGNOSIS — R079 Chest pain, unspecified: Secondary | ICD-10-CM | POA: Diagnosis not present

## 2021-06-08 DIAGNOSIS — I69320 Aphasia following cerebral infarction: Secondary | ICD-10-CM | POA: Diagnosis not present

## 2021-06-08 DIAGNOSIS — I69354 Hemiplegia and hemiparesis following cerebral infarction affecting left non-dominant side: Secondary | ICD-10-CM | POA: Diagnosis not present

## 2021-06-08 DIAGNOSIS — I10 Essential (primary) hypertension: Secondary | ICD-10-CM | POA: Diagnosis not present

## 2021-06-08 DIAGNOSIS — R5381 Other malaise: Secondary | ICD-10-CM | POA: Diagnosis not present

## 2021-06-08 DIAGNOSIS — R791 Abnormal coagulation profile: Secondary | ICD-10-CM | POA: Diagnosis not present

## 2021-06-08 DIAGNOSIS — J690 Pneumonitis due to inhalation of food and vomit: Secondary | ICD-10-CM | POA: Diagnosis not present

## 2021-06-08 DIAGNOSIS — I610 Nontraumatic intracerebral hemorrhage in hemisphere, subcortical: Secondary | ICD-10-CM | POA: Diagnosis not present

## 2021-06-08 DIAGNOSIS — R509 Fever, unspecified: Secondary | ICD-10-CM | POA: Diagnosis not present

## 2021-06-08 DIAGNOSIS — F418 Other specified anxiety disorders: Secondary | ICD-10-CM | POA: Diagnosis not present

## 2021-06-08 DIAGNOSIS — I619 Nontraumatic intracerebral hemorrhage, unspecified: Secondary | ICD-10-CM | POA: Diagnosis not present

## 2021-06-08 DIAGNOSIS — J9811 Atelectasis: Secondary | ICD-10-CM | POA: Diagnosis not present

## 2021-06-08 DIAGNOSIS — I161 Hypertensive emergency: Secondary | ICD-10-CM | POA: Diagnosis not present

## 2021-06-08 DIAGNOSIS — R000 Tachycardia, unspecified: Secondary | ICD-10-CM | POA: Diagnosis not present

## 2021-06-08 DIAGNOSIS — I618 Other nontraumatic intracerebral hemorrhage: Secondary | ICD-10-CM | POA: Diagnosis not present

## 2021-06-08 DIAGNOSIS — E1165 Type 2 diabetes mellitus with hyperglycemia: Secondary | ICD-10-CM | POA: Diagnosis not present

## 2021-06-08 DIAGNOSIS — R29810 Facial weakness: Secondary | ICD-10-CM | POA: Diagnosis not present

## 2021-06-08 DIAGNOSIS — R29714 NIHSS score 14: Secondary | ICD-10-CM | POA: Diagnosis not present

## 2021-06-08 DIAGNOSIS — J984 Other disorders of lung: Secondary | ICD-10-CM | POA: Diagnosis not present

## 2021-06-08 DIAGNOSIS — Z7902 Long term (current) use of antithrombotics/antiplatelets: Secondary | ICD-10-CM | POA: Diagnosis not present

## 2021-06-08 DIAGNOSIS — D509 Iron deficiency anemia, unspecified: Secondary | ICD-10-CM | POA: Diagnosis present

## 2021-06-08 DIAGNOSIS — I69322 Dysarthria following cerebral infarction: Secondary | ICD-10-CM | POA: Diagnosis not present

## 2021-06-08 DIAGNOSIS — R2689 Other abnormalities of gait and mobility: Secondary | ICD-10-CM | POA: Diagnosis not present

## 2021-06-08 DIAGNOSIS — M069 Rheumatoid arthritis, unspecified: Secondary | ICD-10-CM | POA: Diagnosis present

## 2021-06-08 DIAGNOSIS — I6389 Other cerebral infarction: Secondary | ICD-10-CM | POA: Diagnosis not present

## 2021-06-08 DIAGNOSIS — M160 Bilateral primary osteoarthritis of hip: Secondary | ICD-10-CM | POA: Diagnosis not present

## 2021-06-08 DIAGNOSIS — E119 Type 2 diabetes mellitus without complications: Secondary | ICD-10-CM | POA: Diagnosis not present

## 2021-06-08 DIAGNOSIS — E871 Hypo-osmolality and hyponatremia: Secondary | ICD-10-CM | POA: Diagnosis not present

## 2021-06-08 DIAGNOSIS — G935 Compression of brain: Secondary | ICD-10-CM | POA: Diagnosis not present

## 2021-06-08 DIAGNOSIS — R414 Neurologic neglect syndrome: Secondary | ICD-10-CM | POA: Diagnosis not present

## 2021-06-08 DIAGNOSIS — I69328 Other speech and language deficits following cerebral infarction: Secondary | ICD-10-CM | POA: Diagnosis not present

## 2021-06-08 NOTE — Telephone Encounter (Signed)
Noted  KP 

## 2021-06-08 NOTE — Telephone Encounter (Signed)
Called Patient to remind him that his appt was for tomorrow, but was told by someone that he had a stroke and was in the hospital. fyi ?

## 2021-06-09 ENCOUNTER — Ambulatory Visit: Payer: Medicare Other | Admitting: Family Medicine

## 2021-06-17 ENCOUNTER — Ambulatory Visit
Admission: TF | Admit: 2021-06-17 | Disposition: A | Discharge status: Still In Hospital | Payer: MEDICARE | Source: Intra-hospital | Admitting: Physical Medicine & Rehabilitation

## 2021-06-17 ENCOUNTER — Ambulatory Visit
Admission: TF | Admit: 2021-06-17 | Discharge: 2021-07-01 | Disposition: A | Discharge status: Home Health | Payer: MEDICARE | Source: Intra-hospital | Admitting: Physical Medicine & Rehabilitation

## 2021-06-17 DIAGNOSIS — E119 Type 2 diabetes mellitus without complications: Secondary | ICD-10-CM | POA: Diagnosis not present

## 2021-06-17 DIAGNOSIS — I69322 Dysarthria following cerebral infarction: Secondary | ICD-10-CM | POA: Diagnosis not present

## 2021-06-17 DIAGNOSIS — M160 Bilateral primary osteoarthritis of hip: Secondary | ICD-10-CM | POA: Diagnosis not present

## 2021-06-17 DIAGNOSIS — G5792 Unspecified mononeuropathy of left lower limb: Secondary | ICD-10-CM | POA: Diagnosis not present

## 2021-06-17 DIAGNOSIS — I69392 Facial weakness following cerebral infarction: Secondary | ICD-10-CM | POA: Diagnosis not present

## 2021-06-17 DIAGNOSIS — M6281 Muscle weakness (generalized): Secondary | ICD-10-CM | POA: Diagnosis not present

## 2021-06-17 DIAGNOSIS — Z7982 Long term (current) use of aspirin: Secondary | ICD-10-CM | POA: Diagnosis not present

## 2021-06-17 DIAGNOSIS — R5381 Other malaise: Secondary | ICD-10-CM | POA: Diagnosis not present

## 2021-06-17 DIAGNOSIS — I10 Essential (primary) hypertension: Secondary | ICD-10-CM | POA: Diagnosis not present

## 2021-06-17 DIAGNOSIS — G8929 Other chronic pain: Secondary | ICD-10-CM | POA: Diagnosis not present

## 2021-06-17 DIAGNOSIS — R112 Nausea with vomiting, unspecified: Secondary | ICD-10-CM | POA: Diagnosis not present

## 2021-06-17 DIAGNOSIS — Z7984 Long term (current) use of oral hypoglycemic drugs: Secondary | ICD-10-CM | POA: Diagnosis not present

## 2021-06-17 DIAGNOSIS — I69154 Hemiplegia and hemiparesis following nontraumatic intracerebral hemorrhage affecting left non-dominant side: Secondary | ICD-10-CM | POA: Diagnosis not present

## 2021-06-17 DIAGNOSIS — R29714 NIHSS score 14: Secondary | ICD-10-CM | POA: Diagnosis not present

## 2021-06-17 DIAGNOSIS — J9811 Atelectasis: Secondary | ICD-10-CM | POA: Diagnosis not present

## 2021-06-17 DIAGNOSIS — F99 Mental disorder, not otherwise specified: Secondary | ICD-10-CM | POA: Diagnosis not present

## 2021-06-17 DIAGNOSIS — R059 Cough, unspecified: Secondary | ICD-10-CM | POA: Diagnosis not present

## 2021-06-17 DIAGNOSIS — I610 Nontraumatic intracerebral hemorrhage in hemisphere, subcortical: Secondary | ICD-10-CM | POA: Diagnosis not present

## 2021-06-17 DIAGNOSIS — R2689 Other abnormalities of gait and mobility: Secondary | ICD-10-CM | POA: Diagnosis not present

## 2021-06-17 DIAGNOSIS — I69112 Visuospatial deficit and spatial neglect following nontraumatic intracerebral hemorrhage: Secondary | ICD-10-CM | POA: Diagnosis not present

## 2021-06-17 DIAGNOSIS — G2581 Restless legs syndrome: Secondary | ICD-10-CM | POA: Diagnosis not present

## 2021-06-17 DIAGNOSIS — E871 Hypo-osmolality and hyponatremia: Secondary | ICD-10-CM | POA: Diagnosis not present

## 2021-06-17 DIAGNOSIS — R519 Headache, unspecified: Secondary | ICD-10-CM | POA: Diagnosis not present

## 2021-06-17 DIAGNOSIS — M0689 Other specified rheumatoid arthritis, multiple sites: Secondary | ICD-10-CM | POA: Diagnosis not present

## 2021-06-17 DIAGNOSIS — I6389 Other cerebral infarction: Secondary | ICD-10-CM | POA: Diagnosis not present

## 2021-06-17 DIAGNOSIS — F17210 Nicotine dependence, cigarettes, uncomplicated: Secondary | ICD-10-CM | POA: Diagnosis not present

## 2021-06-17 DIAGNOSIS — I618 Other nontraumatic intracerebral hemorrhage: Secondary | ICD-10-CM | POA: Diagnosis not present

## 2021-06-17 DIAGNOSIS — Z7902 Long term (current) use of antithrombotics/antiplatelets: Secondary | ICD-10-CM | POA: Diagnosis not present

## 2021-06-17 DIAGNOSIS — F418 Other specified anxiety disorders: Secondary | ICD-10-CM | POA: Diagnosis not present

## 2021-06-17 DIAGNOSIS — F4310 Post-traumatic stress disorder, unspecified: Secondary | ICD-10-CM | POA: Diagnosis not present

## 2021-06-17 DIAGNOSIS — R279 Unspecified lack of coordination: Secondary | ICD-10-CM | POA: Diagnosis not present

## 2021-06-17 DIAGNOSIS — F1090 Alcohol use, unspecified, uncomplicated: Secondary | ICD-10-CM | POA: Diagnosis not present

## 2021-06-17 DIAGNOSIS — F32A Depression, unspecified: Secondary | ICD-10-CM | POA: Diagnosis not present

## 2021-06-17 DIAGNOSIS — D500 Iron deficiency anemia secondary to blood loss (chronic): Secondary | ICD-10-CM | POA: Diagnosis not present

## 2021-06-17 DIAGNOSIS — I619 Nontraumatic intracerebral hemorrhage, unspecified: Secondary | ICD-10-CM | POA: Diagnosis not present

## 2021-06-21 ENCOUNTER — Ambulatory Visit: Payer: Medicare Other | Admitting: Family Medicine

## 2021-06-28 MED ORDER — ASPIRIN 81 MG CHEWABLE TABLET
ORAL_TABLET | Freq: Every day | ORAL | 1 refills | 30 days | Status: CP
Start: 2021-06-28 — End: 2021-07-28
  Filled 2021-07-01: qty 30, 30d supply, fill #0

## 2021-06-28 MED ORDER — ONDANSETRON 4 MG DISINTEGRATING TABLET
ORAL_TABLET | Freq: Two times a day (BID) | ORAL | 0 refills | 7 days | Status: CP | PRN
Start: 2021-06-28 — End: 2021-07-05
  Filled 2021-07-01: qty 14, 7d supply, fill #0

## 2021-06-28 MED ORDER — ATORVASTATIN 80 MG TABLET
ORAL_TABLET | Freq: Every evening | ORAL | 1 refills | 30 days | Status: CP
Start: 2021-06-28 — End: 2021-07-28
  Filled 2021-07-01: qty 30, 30d supply, fill #0

## 2021-06-28 MED ORDER — NITROGLYCERIN 0.4 MG SUBLINGUAL TABLET
ORAL_TABLET | SUBLINGUAL | 0 refills | 1 days | Status: CP | PRN
Start: 2021-06-28 — End: 2022-06-28
  Filled 2021-07-01: qty 25, 9d supply, fill #0

## 2021-06-28 MED ORDER — DULOXETINE 30 MG CAPSULE,DELAYED RELEASE
ORAL_CAPSULE | Freq: Every day | ORAL | 1 refills | 30 days | Status: CP
Start: 2021-06-28 — End: 2021-07-28
  Filled 2021-07-01: qty 30, 30d supply, fill #0

## 2021-06-28 MED ORDER — METFORMIN 500 MG TABLET
ORAL_TABLET | Freq: Two times a day (BID) | ORAL | 1 refills | 30 days | Status: CP
Start: 2021-06-28 — End: 2021-07-28

## 2021-06-28 MED ORDER — GABAPENTIN 300 MG CAPSULE
ORAL_CAPSULE | Freq: Three times a day (TID) | ORAL | 1 refills | 30 days | Status: CP
Start: 2021-06-28 — End: 2021-07-28
  Filled 2021-07-01: qty 90, 30d supply, fill #0

## 2021-06-28 MED ORDER — CARVEDILOL 6.25 MG TABLET
ORAL_TABLET | Freq: Two times a day (BID) | ORAL | 1 refills | 30 days | Status: CP
Start: 2021-06-28 — End: 2021-07-28
  Filled 2021-07-01: qty 60, 30d supply, fill #0

## 2021-06-28 MED ORDER — CYCLOBENZAPRINE 5 MG TABLET
ORAL_TABLET | Freq: Every evening | ORAL | 1 refills | 30 days | Status: CP
Start: 2021-06-28 — End: 2021-07-28
  Filled 2021-07-01: qty 30, 30d supply, fill #0

## 2021-07-01 ENCOUNTER — Telehealth: Payer: Self-pay | Admitting: Family Medicine

## 2021-07-01 NOTE — Telephone Encounter (Signed)
Home Health Verbal Orders - [facility]  Callback Number: [phone]  Requesting OT/PT/Skilled Nursing/Social Work/Speech Therapy: Continued PT, OT, Speech  Frequency: Evaluation (Sunday 07/03/21)

## 2021-07-04 ENCOUNTER — Telehealth: Payer: Self-pay

## 2021-07-04 NOTE — Telephone Encounter (Signed)
Transition Care Management Follow-up Telephone Call Date of discharge and from where: 07/01/21 [facility] How have you been since you were released from the hospital? Pt states he still feels terrible and has a headache. He is doing PT, OT and ST Any questions or concerns? No  Items Reviewed: Did the pt receive and understand the discharge instructions provided? Yes  Medications obtained and verified? Yes  Other? No  Any new allergies since your discharge? No  Dietary orders reviewed? Yes Do you have support at home? Yes   Home Care and Equipment/Supplies: Were home health services ordered? yes If so, what is the name of the agency? [facility] Has the agency set up a time to come to the patient's home? No - awaiting hosp fu orders from PCP Were any new equipment or medical supplies ordered?  Yes: bedside commode and wheelchair Were you able to get the supplies/equipment? yes Do you have any questions related to the use of the equipment or supplies? No  Functional Questionnaire: (I = Independent and D = Dependent) ADLs: I with assistance  Bathing/Dressing- D  Meal Prep- D  Eating- I  Maintaining continence- I  Transferring/Ambulation- I with assistance  Managing Meds- I   Follow up appointments reviewed:  PCP Hospital f/u appt confirmed? Yes  Scheduled to see Dr. Yetta Barre on 07/07/21 @ 1:40. Specialist Hospital f/u appt confirmed? No  awaiting neurology appt Are transportation arrangements needed? No  If their condition worsens, is the pt aware to call PCP or go to the Emergency Dept.? Yes Was the patient provided with contact information for the PCP's office or ED? Yes Was to pt encouraged to call back with questions or concerns? Yes

## 2021-07-05 ENCOUNTER — Ambulatory Visit: Admit: 2021-07-05 | Payer: MEDICARE

## 2021-07-05 ENCOUNTER — Encounter: Admit: 2021-07-05 | Payer: MEDICARE

## 2021-07-05 ENCOUNTER — Inpatient Hospital Stay: Admit: 2021-07-05 | Payer: MEDICARE

## 2021-07-05 DIAGNOSIS — E119 Type 2 diabetes mellitus without complications: Secondary | ICD-10-CM | POA: Diagnosis not present

## 2021-07-05 DIAGNOSIS — M0589 Other rheumatoid arthritis with rheumatoid factor of multiple sites: Secondary | ICD-10-CM | POA: Diagnosis not present

## 2021-07-05 DIAGNOSIS — E871 Hypo-osmolality and hyponatremia: Secondary | ICD-10-CM | POA: Diagnosis not present

## 2021-07-05 DIAGNOSIS — R1310 Dysphagia, unspecified: Secondary | ICD-10-CM | POA: Diagnosis not present

## 2021-07-05 DIAGNOSIS — F419 Anxiety disorder, unspecified: Secondary | ICD-10-CM | POA: Diagnosis not present

## 2021-07-05 DIAGNOSIS — I69198 Other sequelae of nontraumatic intracerebral hemorrhage: Secondary | ICD-10-CM | POA: Diagnosis not present

## 2021-07-05 DIAGNOSIS — I69152 Hemiplegia and hemiparesis following nontraumatic intracerebral hemorrhage affecting left dominant side: Secondary | ICD-10-CM | POA: Diagnosis not present

## 2021-07-05 DIAGNOSIS — I69122 Dysarthria following nontraumatic intracerebral hemorrhage: Secondary | ICD-10-CM | POA: Diagnosis not present

## 2021-07-05 DIAGNOSIS — F4310 Post-traumatic stress disorder, unspecified: Secondary | ICD-10-CM | POA: Diagnosis not present

## 2021-07-05 DIAGNOSIS — Z993 Dependence on wheelchair: Secondary | ICD-10-CM | POA: Diagnosis not present

## 2021-07-05 DIAGNOSIS — Z9181 History of falling: Secondary | ICD-10-CM | POA: Diagnosis not present

## 2021-07-05 DIAGNOSIS — M160 Bilateral primary osteoarthritis of hip: Secondary | ICD-10-CM | POA: Diagnosis not present

## 2021-07-05 DIAGNOSIS — I69192 Facial weakness following nontraumatic intracerebral hemorrhage: Secondary | ICD-10-CM | POA: Diagnosis not present

## 2021-07-05 DIAGNOSIS — I69119 Unspecified symptoms and signs involving cognitive functions following nontraumatic intracerebral hemorrhage: Secondary | ICD-10-CM | POA: Diagnosis not present

## 2021-07-05 DIAGNOSIS — G2581 Restless legs syndrome: Secondary | ICD-10-CM | POA: Diagnosis not present

## 2021-07-05 DIAGNOSIS — D509 Iron deficiency anemia, unspecified: Secondary | ICD-10-CM | POA: Diagnosis not present

## 2021-07-05 DIAGNOSIS — Z7984 Long term (current) use of oral hypoglycemic drugs: Secondary | ICD-10-CM | POA: Diagnosis not present

## 2021-07-05 DIAGNOSIS — F32A Depression, unspecified: Secondary | ICD-10-CM | POA: Diagnosis not present

## 2021-07-05 DIAGNOSIS — H539 Unspecified visual disturbance: Secondary | ICD-10-CM | POA: Diagnosis not present

## 2021-07-05 DIAGNOSIS — G8929 Other chronic pain: Secondary | ICD-10-CM | POA: Diagnosis not present

## 2021-07-05 DIAGNOSIS — I69191 Dysphagia following nontraumatic intracerebral hemorrhage: Secondary | ICD-10-CM | POA: Diagnosis not present

## 2021-07-05 DIAGNOSIS — I10 Essential (primary) hypertension: Secondary | ICD-10-CM | POA: Diagnosis not present

## 2021-07-05 DIAGNOSIS — M25512 Pain in left shoulder: Secondary | ICD-10-CM | POA: Diagnosis not present

## 2021-07-05 DIAGNOSIS — Z7982 Long term (current) use of aspirin: Secondary | ICD-10-CM | POA: Diagnosis not present

## 2021-07-05 DIAGNOSIS — M25511 Pain in right shoulder: Secondary | ICD-10-CM | POA: Diagnosis not present

## 2021-07-06 DIAGNOSIS — F4312 Post-traumatic stress disorder, chronic: Secondary | ICD-10-CM | POA: Diagnosis not present

## 2021-07-06 DIAGNOSIS — F54 Psychological and behavioral factors associated with disorders or diseases classified elsewhere: Secondary | ICD-10-CM | POA: Diagnosis not present

## 2021-07-07 ENCOUNTER — Ambulatory Visit (INDEPENDENT_AMBULATORY_CARE_PROVIDER_SITE_OTHER): Payer: Medicare Other | Admitting: Family Medicine

## 2021-07-07 ENCOUNTER — Encounter: Payer: Self-pay | Admitting: Family Medicine

## 2021-07-07 VITALS — BP 124/84 | Ht 68.0 in | Wt 170.0 lb

## 2021-07-07 DIAGNOSIS — E785 Hyperlipidemia, unspecified: Secondary | ICD-10-CM

## 2021-07-07 DIAGNOSIS — D508 Other iron deficiency anemias: Secondary | ICD-10-CM

## 2021-07-07 DIAGNOSIS — I10 Essential (primary) hypertension: Secondary | ICD-10-CM

## 2021-07-07 DIAGNOSIS — G8194 Hemiplegia, unspecified affecting left nondominant side: Secondary | ICD-10-CM

## 2021-07-07 DIAGNOSIS — E119 Type 2 diabetes mellitus without complications: Secondary | ICD-10-CM | POA: Diagnosis not present

## 2021-07-07 NOTE — Progress Notes (Signed)
Date:  07/07/2021   Name:  Patrick Perez   DOB:  1966-01-15   MRN:  [id number]   Chief Complaint: Diabetes (Needs foot exam) and Anemia  [facility] and was discharged on 5/5. Transition of care call placed on none..     Diabetes He presents for his follow-up diabetic visit. He has type 2 diabetes mellitus. His disease course has been stable. There are no hypoglycemic associated symptoms. Pertinent negatives for hypoglycemia include no dizziness. Associated symptoms include weakness. Pertinent negatives for diabetes include no blurred vision, no chest pain, no fatigue, no foot paresthesias, no foot ulcerations, no polydipsia, no polyphagia, no polyuria, no visual change and no weight loss. There are no hypoglycemic complications. Symptoms are stable. There are no diabetic complications. Current diabetic treatment includes oral agent (monotherapy). He participates in exercise daily. His breakfast blood glucose is taken between 8-9 am. His breakfast blood glucose range is generally 110-130 mg/dl. An ACE inhibitor/angiotensin II receptor blocker is being taken.  Anemia Presents for follow-up visit. There has been no abdominal pain, light-headedness or weight loss. Signs of blood loss that are not present include hematemesis, hematochezia and melena. There is no history of chronic renal disease or hypothyroidism. There are no compliance problems.   Hyperlipidemia This is a chronic problem. The current episode started more than 1 year ago. The problem is controlled. Recent lipid tests were reviewed and are normal. He has no history of chronic renal disease, diabetes, hypothyroidism, liver disease, obesity or nephrotic syndrome. There are no known factors aggravating his hyperlipidemia. Pertinent negatives include no chest pain, focal sensory loss, focal weakness, leg pain, myalgias or shortness of breath. Current antihyperlipidemic treatment includes statins. The current  treatment provides moderate improvement of lipids. There are no compliance problems.   Hypertension This is a chronic problem. The current episode started more than 1 year ago. The problem has been gradually improving since onset. The problem is controlled. Pertinent negatives include no blurred vision, chest pain or shortness of breath. There is no history of chronic renal disease.   Lab Results  Component Value Date   NA 134 (L) 06/02/2021   K 4.3 06/02/2021   CO2 25 06/02/2021   GLUCOSE 109 (H) 06/02/2021   BUN 7 06/02/2021   CREATININE 0.76 06/02/2021   CALCIUM 8.7 (L) 06/02/2021   EGFR 103 02/16/2021   GFRNONAA >60 06/02/2021   Lab Results  Component Value Date   CHOL 123 06/01/2021   HDL 14 (L) 06/01/2021   LDLCALC 86 06/01/2021   TRIG 115 06/01/2021   CHOLHDL 8.8 06/01/2021   Lab Results  Component Value Date   TSH 2.24 07/09/2020   Lab Results  Component Value Date   HGBA1C 6.4 (H) 06/01/2021   Lab Results  Component Value Date   WBC 10.7 (H) 06/02/2021   HGB 10.8 (L) 06/02/2021   HCT 33.7 (L) 06/02/2021   MCV 78.7 (L) 06/02/2021   PLT 367 06/02/2021   Lab Results  Component Value Date   ALT 51 (H) 06/01/2021   AST 57 (H) 06/01/2021   ALKPHOS 198 (H) 06/01/2021   BILITOT 0.8 06/01/2021   No results found for: 25OHVITD2, 25OHVITD3, VD25OH   Review of Systems  Constitutional:  Negative for fatigue and weight loss.  Eyes:  Negative for blurred vision.  Respiratory:  Negative for shortness of breath.   Cardiovascular:  Negative for chest pain.  Gastrointestinal:  Negative for abdominal pain, hematemesis, hematochezia  and melena.  Endocrine: Negative for polydipsia, polyphagia and polyuria.  Musculoskeletal:  Negative for myalgias.  Neurological:  Positive for facial asymmetry, weakness and numbness. Negative for dizziness, focal weakness and light-headedness.   Patient Active Problem List   Diagnosis Date Noted   Stroke [facility]) 06/01/2021   CAP  (community acquired pneumonia) 06/01/2021   Hyponatremia 06/01/2021   Sepsis ([place]) 06/01/2021   Abnormal LFTs 06/01/2021   Diabetes mellitus without complication ([place]) 26/20/3559   Depression with anxiety 06/01/2021   RA (rheumatoid arthritis) ([place]) 09/13/2015   Rheumatoid arthritis ([place]) 09/13/2015   Laceration of right hand with complication 74/16/3845    Allergies  Allergen Reactions   Oxycodone Hives   Oxycodone-Acetaminophen Anxiety    Past Surgical History:  Procedure Laterality Date   TIBIA FRACTURE SURGERY Left    TONSILLECTOMY      Social History   Tobacco Use   Smoking status: Former    Packs/day: 0.50    Years: 27.00    Pack years: 13.50    Types: Cigarettes    Quit date: 04/27/2020    Years since quitting: 1.1   Smokeless tobacco: Never   Tobacco comments:    using nicorett gum  Vaping Use   Vaping Use: Never used  Substance Use Topics   Alcohol use: Yes    Alcohol/week: 2.0 standard drinks    Types: 2 Cans of beer per week   Drug use: Yes    Frequency: 7.0 times per week    Types: Marijuana    Comment: smokes once a day      Medication list has been reviewed and updated.  Current Meds  Medication Sig   aspirin 81 MG chewable tablet Chew 1 tablet (81 mg total) by mouth daily.   atorvastatin (LIPITOR) 80 MG tablet Take 1 tablet by mouth daily.   Cholecalciferol (VITAMIN D3) 10 MCG (400 UNIT) tablet Take by mouth.   cyclobenzaprine (FLEXERIL) 5 MG tablet Take 1 tablet by mouth at bedtime.   DULoxetine (CYMBALTA) 30 MG capsule Take 1 capsule by mouth daily.   ferrous sulfate 325 (65 FE) MG tablet Take by mouth.   folic acid (FOLVITE) 1 MG tablet Take by mouth.   gabapentin (NEURONTIN) 300 MG capsule Take 1 capsule (300 mg total) by mouth at bedtime. Dr Sunday Shams (Patient taking differently: Take 300 mg by mouth 3 (three) times daily. Dr Sunday Shams)   melatonin 3 MG TABS tablet Take by mouth.   metFORMIN (GLUCOPHAGE) 500 MG tablet Take 1 tablet (500 mg  total) by mouth 2 (two) times daily with a meal.   OneTouch Delica Lancets 36I MISC USE TO TEST ONCE DAILY   ONETOUCH ULTRA test strip USE TO TEST ONCE DAILY   [DISCONTINUED] atorvastatin (LIPITOR) 40 MG tablet Take 1 tablet (40 mg total) by mouth daily.   [DISCONTINUED] cyclobenzaprine (FLEXERIL) 10 MG tablet Take 1 tablet (10 mg total) by mouth at bedtime. Dr Sunday Shams       07/07/2021   11:12 AM 02/16/2021    9:32 AM 07/13/2020    8:27 AM 10/22/2019    9:53 AM  GAD 7 : Generalized Anxiety Score  Nervous, Anxious, on Edge 1 0 0 1  Control/stop worrying 2 0 0 0  Worry too much - different things 2 0 0 0  Trouble relaxing 1 0 0 1  Restless 2 0 0 1  Easily annoyed or irritable 0 0 0 1  Afraid - awful might happen 0 0 0 0  Total GAD 7 Score 8 0 0 4  Anxiety Difficulty Not difficult at all   Somewhat difficult       07/07/2021   11:11 AM  Depression screen PHQ 2/9  Decreased Interest 3  Down, Depressed, Hopeless 1  PHQ - 2 Score 4  Altered sleeping 1  Tired, decreased energy 0  Change in appetite 1  Feeling bad or failure about yourself  1  Trouble concentrating 0  Moving slowly or fidgety/restless 2  Suicidal thoughts 0  PHQ-9 Score 9  Difficult doing work/chores Somewhat difficult    BP Readings from Last 3 Encounters:  07/07/21 124/84  06/03/21 133/84  02/16/21 (!) 142/96    Physical Exam Vitals and nursing note reviewed.  Cardiovascular:     Heart sounds: Normal heart sounds, S1 normal and S2 normal. No murmur heard. No systolic murmur is present.  No diastolic murmur is present.    No gallop. No S3 or S4 sounds.  Pulmonary:     Breath sounds: No decreased breath sounds, wheezing, rhonchi or rales.  Musculoskeletal:     Right lower leg: No edema.     Left lower leg: No edema.    Wt Readings from Last 3 Encounters:  07/07/21 170 lb (77.1 kg)  06/01/21 185 lb (83.9 kg)  02/16/21 201 lb (91.2 kg)    BP 124/84   Ht _0  (1.727 m)   Wt 170 lb (77.1 kg)    BMI 25.85 kg/m   Assessment and Plan: Patient has follow-up for hospital discharge for hemorrhagic stroke with residual left hemiparesis. 1. Left hemiparesis (HCC) Chronic.  Persistent.  Stable.  Patient has left hemiparesis status post hemorrhagic stroke.  Patient is having some movement in the left foot and is gradually improving.  Patient is followed by neurology at [facility].  2. Diabetes mellitus, new onset ([place]) Chronic.  Persistent.  Stable.  Fasting blood sugars in the 120 range.  Currently on metformin 500 mg twice a day.  3. Hyperlipidemia, unspecified hyperlipidemia type Chronic.  Controlled.  Stable.  Recently increased atorvastatin to 80 mg while holding on recheck until is in system for couple of weeks.  4. Primary hypertension Chronic.  Controlled.  Stable.  Patient is currently controlling with sodium restriction and current blood pressure is acceptable at 124/84.  5. Other iron deficiency anemia Patient has anemia which is of an iron deficiency and folic acid etiology.  Iron has recently been resumed and we will wait until patient returns in 6 weeks to recheck hemoglobin.

## 2021-07-08 DIAGNOSIS — R1310 Dysphagia, unspecified: Secondary | ICD-10-CM | POA: Diagnosis not present

## 2021-07-08 DIAGNOSIS — I69192 Facial weakness following nontraumatic intracerebral hemorrhage: Secondary | ICD-10-CM | POA: Diagnosis not present

## 2021-07-08 DIAGNOSIS — I69152 Hemiplegia and hemiparesis following nontraumatic intracerebral hemorrhage affecting left dominant side: Secondary | ICD-10-CM | POA: Diagnosis not present

## 2021-07-08 DIAGNOSIS — I69191 Dysphagia following nontraumatic intracerebral hemorrhage: Secondary | ICD-10-CM | POA: Diagnosis not present

## 2021-07-08 DIAGNOSIS — I69122 Dysarthria following nontraumatic intracerebral hemorrhage: Secondary | ICD-10-CM | POA: Diagnosis not present

## 2021-07-08 DIAGNOSIS — I69198 Other sequelae of nontraumatic intracerebral hemorrhage: Secondary | ICD-10-CM | POA: Diagnosis not present

## 2021-07-12 ENCOUNTER — Ambulatory Visit (INDEPENDENT_AMBULATORY_CARE_PROVIDER_SITE_OTHER): Payer: Medicare Other | Admitting: Family Medicine

## 2021-07-12 ENCOUNTER — Ambulatory Visit: Payer: Self-pay

## 2021-07-12 ENCOUNTER — Telehealth: Payer: Self-pay

## 2021-07-12 DIAGNOSIS — I69119 Unspecified symptoms and signs involving cognitive functions following nontraumatic intracerebral hemorrhage: Secondary | ICD-10-CM | POA: Diagnosis not present

## 2021-07-12 DIAGNOSIS — F32A Depression, unspecified: Secondary | ICD-10-CM | POA: Diagnosis not present

## 2021-07-12 DIAGNOSIS — I69198 Other sequelae of nontraumatic intracerebral hemorrhage: Secondary | ICD-10-CM | POA: Diagnosis not present

## 2021-07-12 DIAGNOSIS — I69192 Facial weakness following nontraumatic intracerebral hemorrhage: Secondary | ICD-10-CM | POA: Diagnosis not present

## 2021-07-12 DIAGNOSIS — I69152 Hemiplegia and hemiparesis following nontraumatic intracerebral hemorrhage affecting left dominant side: Secondary | ICD-10-CM | POA: Diagnosis not present

## 2021-07-12 DIAGNOSIS — F419 Anxiety disorder, unspecified: Secondary | ICD-10-CM | POA: Diagnosis not present

## 2021-07-12 DIAGNOSIS — R1310 Dysphagia, unspecified: Secondary | ICD-10-CM | POA: Diagnosis not present

## 2021-07-12 DIAGNOSIS — I10 Essential (primary) hypertension: Secondary | ICD-10-CM | POA: Diagnosis not present

## 2021-07-12 DIAGNOSIS — H539 Unspecified visual disturbance: Secondary | ICD-10-CM | POA: Diagnosis not present

## 2021-07-12 DIAGNOSIS — I69191 Dysphagia following nontraumatic intracerebral hemorrhage: Secondary | ICD-10-CM | POA: Diagnosis not present

## 2021-07-12 DIAGNOSIS — E119 Type 2 diabetes mellitus without complications: Secondary | ICD-10-CM | POA: Diagnosis not present

## 2021-07-12 DIAGNOSIS — I69122 Dysarthria following nontraumatic intracerebral hemorrhage: Secondary | ICD-10-CM | POA: Diagnosis not present

## 2021-07-12 NOTE — Telephone Encounter (Signed)
  Chief Complaint: Pain 8/10  - stroke related Symptoms: Pain Frequency: since 2nd stroke Pertinent Negatives: Patient denies Chest pain, sob, fever Disposition: [x] ED /[] Urgent Care (no appt availability in office) / [] Appointment(In office/virtual)/ []  [facility]/ [] Home Care/ [] Refused Recommended Disposition /[] [place] [facility]/ []  Follow-up with PCP Additional Notes: Pt refuses Ed . Pt would like to be evaluated by Dr. Ronnald Ramp. Pt would take virtual or phone appointment if possible, but will come into office to be seen.  PT states that pain is so bad he cannot sleep at night, nor can he participate in PT.  Please return call if Dr. Ronnald Ramp wants to have a virtual appt with pt. Reason for Disposition  [1] SEVERE pain AND [2] not improved 2 hours after pain medicine  Answer Assessment - Initial Assessment Questions 1. ONSET: "When did the pain start?"     Since stroke 2. LOCATION: "Where is the pain located?"     Left arm and leg 3. PAIN: "How bad is the pain?" (Scale 1-10; or mild, moderate, severe)   - MILD (1-3): doesn't interfere with normal activities   - MODERATE (4-7): interferes with normal activities (e.g., work or school) or awakens from sleep   - SEVERE (8-10): excruciating pain, unable to do any normal activities, unable to hold a cup of water     8/10 4. WORK OR EXERCISE: "Has there been any recent work or exercise that involved this part of the body?"     Strokes 5. CAUSE: "What do you think is causing the arm pain?"     Stroke 8/10 - sensitive to touch 6. OTHER SYMPTOMS: "Do you have any other symptoms?" (e.g., neck pain, swelling, rash, fever, numbness, weakness)     na 7. PREGNANCY: "Is there any chance you are pregnant?" "When was your last menstrual period?"     na  Protocols used: Arm Pain-A-AH

## 2021-07-12 NOTE — Telephone Encounter (Signed)
Called pt after he spoke to RN/ [facility]- he was advised by her to go to ER for further evaluation. After reading symptoms and knowing that he has had recent stroke- he needs to go to ER. I left a message stating he should go to [facility] ER since that is where his last admission was

## 2021-07-12 NOTE — Progress Notes (Signed)
Pt came in c/o increased pain s/p stroke. He did not want to proceed with appt. After understanding that we could not increase or prescribe further pain medications. I called [facility] and got an appt. With Dr. Jomarie Longs in August/ [name] (wife) was given the appt date and time. Pt was told in the meantime to go to ER for further evaluation and to see neurologist on call

## 2021-07-13 ENCOUNTER — Emergency Department: Admit: 2021-07-13 | Discharge: 2021-07-13 | Disposition: A | Discharge status: Home / Self Care | Payer: MEDICARE

## 2021-07-13 ENCOUNTER — Ambulatory Visit: Admit: 2021-07-13 | Discharge: 2021-07-13 | Disposition: A | Discharge status: Home / Self Care | Payer: MEDICARE

## 2021-07-13 DIAGNOSIS — R519 Nonintractable headache, unspecified chronicity pattern, unspecified headache type: Principal | ICD-10-CM

## 2021-07-13 DIAGNOSIS — I619 Nontraumatic intracerebral hemorrhage, unspecified: Principal | ICD-10-CM

## 2021-07-13 DIAGNOSIS — G2581 Restless legs syndrome: Principal | ICD-10-CM

## 2021-07-13 DIAGNOSIS — H538 Other visual disturbances: Secondary | ICD-10-CM | POA: Diagnosis not present

## 2021-07-13 DIAGNOSIS — R200 Anesthesia of skin: Secondary | ICD-10-CM | POA: Diagnosis not present

## 2021-07-13 DIAGNOSIS — F4310 Post-traumatic stress disorder, unspecified: Secondary | ICD-10-CM | POA: Diagnosis not present

## 2021-07-13 DIAGNOSIS — I10 Essential (primary) hypertension: Secondary | ICD-10-CM | POA: Diagnosis not present

## 2021-07-13 DIAGNOSIS — Z7982 Long term (current) use of aspirin: Secondary | ICD-10-CM | POA: Diagnosis not present

## 2021-07-13 DIAGNOSIS — Z888 Allergy status to other drugs, medicaments and biological substances status: Secondary | ICD-10-CM | POA: Diagnosis not present

## 2021-07-13 DIAGNOSIS — R9431 Abnormal electrocardiogram [ECG] [EKG]: Secondary | ICD-10-CM | POA: Diagnosis not present

## 2021-07-13 DIAGNOSIS — Z79899 Other long term (current) drug therapy: Secondary | ICD-10-CM | POA: Diagnosis not present

## 2021-07-13 DIAGNOSIS — I610 Nontraumatic intracerebral hemorrhage in hemisphere, subcortical: Secondary | ICD-10-CM | POA: Diagnosis not present

## 2021-07-13 DIAGNOSIS — H539 Unspecified visual disturbance: Secondary | ICD-10-CM | POA: Diagnosis not present

## 2021-07-13 DIAGNOSIS — Z885 Allergy status to narcotic agent status: Secondary | ICD-10-CM | POA: Diagnosis not present

## 2021-07-13 DIAGNOSIS — Z8673 Personal history of transient ischemic attack (TIA), and cerebral infarction without residual deficits: Secondary | ICD-10-CM | POA: Diagnosis not present

## 2021-07-13 DIAGNOSIS — F17210 Nicotine dependence, cigarettes, uncomplicated: Secondary | ICD-10-CM | POA: Diagnosis not present

## 2021-07-13 DIAGNOSIS — E119 Type 2 diabetes mellitus without complications: Secondary | ICD-10-CM | POA: Diagnosis not present

## 2021-07-13 DIAGNOSIS — M069 Rheumatoid arthritis, unspecified: Secondary | ICD-10-CM | POA: Diagnosis not present

## 2021-07-13 DIAGNOSIS — Z7984 Long term (current) use of oral hypoglycemic drugs: Secondary | ICD-10-CM | POA: Diagnosis not present

## 2021-07-13 MED ORDER — OXYCODONE 5 MG TABLET
ORAL_TABLET | ORAL | 0 refills | 2 days | Status: CP | PRN
Start: 2021-07-13 — End: 2021-07-18

## 2021-07-14 DIAGNOSIS — I69152 Hemiplegia and hemiparesis following nontraumatic intracerebral hemorrhage affecting left dominant side: Secondary | ICD-10-CM | POA: Diagnosis not present

## 2021-07-14 DIAGNOSIS — R1310 Dysphagia, unspecified: Secondary | ICD-10-CM | POA: Diagnosis not present

## 2021-07-14 DIAGNOSIS — R519 Headache, unspecified: Secondary | ICD-10-CM | POA: Diagnosis not present

## 2021-07-14 DIAGNOSIS — I69192 Facial weakness following nontraumatic intracerebral hemorrhage: Secondary | ICD-10-CM | POA: Diagnosis not present

## 2021-07-14 DIAGNOSIS — I69198 Other sequelae of nontraumatic intracerebral hemorrhage: Secondary | ICD-10-CM | POA: Diagnosis not present

## 2021-07-14 DIAGNOSIS — I69191 Dysphagia following nontraumatic intracerebral hemorrhage: Secondary | ICD-10-CM | POA: Diagnosis not present

## 2021-07-14 DIAGNOSIS — I69122 Dysarthria following nontraumatic intracerebral hemorrhage: Secondary | ICD-10-CM | POA: Diagnosis not present

## 2021-07-14 MED ORDER — OXYCODONE 5 MG TABLET
ORAL_TABLET | ORAL | 0 refills | 2 days | Status: CP | PRN
Start: 2021-07-14 — End: 2021-07-19

## 2021-07-15 DIAGNOSIS — I619 Nontraumatic intracerebral hemorrhage, unspecified: Principal | ICD-10-CM

## 2021-07-15 DIAGNOSIS — G2581 Restless legs syndrome: Principal | ICD-10-CM

## 2021-07-15 DIAGNOSIS — R1310 Dysphagia, unspecified: Secondary | ICD-10-CM | POA: Diagnosis not present

## 2021-07-15 DIAGNOSIS — I69152 Hemiplegia and hemiparesis following nontraumatic intracerebral hemorrhage affecting left dominant side: Secondary | ICD-10-CM | POA: Diagnosis not present

## 2021-07-15 DIAGNOSIS — I69122 Dysarthria following nontraumatic intracerebral hemorrhage: Secondary | ICD-10-CM | POA: Diagnosis not present

## 2021-07-15 DIAGNOSIS — I69191 Dysphagia following nontraumatic intracerebral hemorrhage: Secondary | ICD-10-CM | POA: Diagnosis not present

## 2021-07-15 DIAGNOSIS — I69198 Other sequelae of nontraumatic intracerebral hemorrhage: Secondary | ICD-10-CM | POA: Diagnosis not present

## 2021-07-15 DIAGNOSIS — I69192 Facial weakness following nontraumatic intracerebral hemorrhage: Secondary | ICD-10-CM | POA: Diagnosis not present

## 2021-07-18 DIAGNOSIS — G2581 Restless legs syndrome: Principal | ICD-10-CM

## 2021-07-18 DIAGNOSIS — I619 Nontraumatic intracerebral hemorrhage, unspecified: Principal | ICD-10-CM

## 2021-07-18 DIAGNOSIS — Z7409 Other reduced mobility: Secondary | ICD-10-CM | POA: Diagnosis not present

## 2021-07-18 DIAGNOSIS — Z789 Other specified health status: Secondary | ICD-10-CM | POA: Diagnosis not present

## 2021-07-18 DIAGNOSIS — I69192 Facial weakness following nontraumatic intracerebral hemorrhage: Secondary | ICD-10-CM | POA: Diagnosis not present

## 2021-07-18 DIAGNOSIS — I69152 Hemiplegia and hemiparesis following nontraumatic intracerebral hemorrhage affecting left dominant side: Secondary | ICD-10-CM | POA: Diagnosis not present

## 2021-07-18 DIAGNOSIS — R1310 Dysphagia, unspecified: Secondary | ICD-10-CM | POA: Diagnosis not present

## 2021-07-18 DIAGNOSIS — I69198 Other sequelae of nontraumatic intracerebral hemorrhage: Secondary | ICD-10-CM | POA: Diagnosis not present

## 2021-07-18 DIAGNOSIS — I69122 Dysarthria following nontraumatic intracerebral hemorrhage: Secondary | ICD-10-CM | POA: Diagnosis not present

## 2021-07-18 DIAGNOSIS — I69191 Dysphagia following nontraumatic intracerebral hemorrhage: Secondary | ICD-10-CM | POA: Diagnosis not present

## 2021-07-20 DIAGNOSIS — I619 Nontraumatic intracerebral hemorrhage, unspecified: Principal | ICD-10-CM

## 2021-07-20 DIAGNOSIS — G2581 Restless legs syndrome: Principal | ICD-10-CM

## 2021-07-20 DIAGNOSIS — I69191 Dysphagia following nontraumatic intracerebral hemorrhage: Secondary | ICD-10-CM | POA: Diagnosis not present

## 2021-07-20 DIAGNOSIS — I69122 Dysarthria following nontraumatic intracerebral hemorrhage: Secondary | ICD-10-CM | POA: Diagnosis not present

## 2021-07-20 DIAGNOSIS — F4312 Post-traumatic stress disorder, chronic: Secondary | ICD-10-CM | POA: Diagnosis not present

## 2021-07-20 DIAGNOSIS — I69152 Hemiplegia and hemiparesis following nontraumatic intracerebral hemorrhage affecting left dominant side: Secondary | ICD-10-CM | POA: Diagnosis not present

## 2021-07-20 DIAGNOSIS — R1310 Dysphagia, unspecified: Secondary | ICD-10-CM | POA: Diagnosis not present

## 2021-07-20 DIAGNOSIS — F54 Psychological and behavioral factors associated with disorders or diseases classified elsewhere: Secondary | ICD-10-CM | POA: Diagnosis not present

## 2021-07-20 DIAGNOSIS — I69192 Facial weakness following nontraumatic intracerebral hemorrhage: Secondary | ICD-10-CM | POA: Diagnosis not present

## 2021-07-20 DIAGNOSIS — I69198 Other sequelae of nontraumatic intracerebral hemorrhage: Secondary | ICD-10-CM | POA: Diagnosis not present

## 2021-07-21 ENCOUNTER — Emergency Department: Admit: 2021-07-21 | Discharge: 2021-07-21 | Disposition: A | Discharge status: Home / Self Care | Payer: MEDICARE

## 2021-07-21 ENCOUNTER — Ambulatory Visit: Admit: 2021-07-21 | Discharge: 2021-07-21 | Disposition: A | Discharge status: Home / Self Care | Payer: MEDICARE

## 2021-07-21 DIAGNOSIS — M25552 Pain in left hip: Principal | ICD-10-CM

## 2021-07-21 DIAGNOSIS — M25512 Pain in left shoulder: Principal | ICD-10-CM

## 2021-07-21 DIAGNOSIS — G8929 Other chronic pain: Principal | ICD-10-CM

## 2021-07-21 DIAGNOSIS — F4310 Post-traumatic stress disorder, unspecified: Secondary | ICD-10-CM | POA: Diagnosis not present

## 2021-07-21 DIAGNOSIS — Z7982 Long term (current) use of aspirin: Secondary | ICD-10-CM | POA: Diagnosis not present

## 2021-07-21 DIAGNOSIS — M069 Rheumatoid arthritis, unspecified: Secondary | ICD-10-CM | POA: Diagnosis not present

## 2021-07-21 DIAGNOSIS — R000 Tachycardia, unspecified: Secondary | ICD-10-CM | POA: Diagnosis not present

## 2021-07-21 DIAGNOSIS — M19012 Primary osteoarthritis, left shoulder: Secondary | ICD-10-CM | POA: Diagnosis not present

## 2021-07-21 DIAGNOSIS — Z791 Long term (current) use of non-steroidal anti-inflammatories (NSAID): Secondary | ICD-10-CM | POA: Diagnosis not present

## 2021-07-21 DIAGNOSIS — Z7984 Long term (current) use of oral hypoglycemic drugs: Secondary | ICD-10-CM | POA: Diagnosis not present

## 2021-07-21 DIAGNOSIS — I10 Essential (primary) hypertension: Secondary | ICD-10-CM | POA: Diagnosis not present

## 2021-07-21 DIAGNOSIS — S4992XA Unspecified injury of left shoulder and upper arm, initial encounter: Secondary | ICD-10-CM | POA: Diagnosis not present

## 2021-07-21 DIAGNOSIS — E119 Type 2 diabetes mellitus without complications: Secondary | ICD-10-CM | POA: Diagnosis not present

## 2021-07-22 DIAGNOSIS — I69192 Facial weakness following nontraumatic intracerebral hemorrhage: Secondary | ICD-10-CM | POA: Diagnosis not present

## 2021-07-22 DIAGNOSIS — I69198 Other sequelae of nontraumatic intracerebral hemorrhage: Secondary | ICD-10-CM | POA: Diagnosis not present

## 2021-07-22 DIAGNOSIS — I69152 Hemiplegia and hemiparesis following nontraumatic intracerebral hemorrhage affecting left dominant side: Secondary | ICD-10-CM | POA: Diagnosis not present

## 2021-07-22 DIAGNOSIS — I69191 Dysphagia following nontraumatic intracerebral hemorrhage: Secondary | ICD-10-CM | POA: Diagnosis not present

## 2021-07-22 DIAGNOSIS — I69122 Dysarthria following nontraumatic intracerebral hemorrhage: Secondary | ICD-10-CM | POA: Diagnosis not present

## 2021-07-22 DIAGNOSIS — R1310 Dysphagia, unspecified: Secondary | ICD-10-CM | POA: Diagnosis not present

## 2021-07-25 DIAGNOSIS — I619 Nontraumatic intracerebral hemorrhage, unspecified: Principal | ICD-10-CM

## 2021-07-25 DIAGNOSIS — G2581 Restless legs syndrome: Principal | ICD-10-CM

## 2021-07-25 DIAGNOSIS — R1310 Dysphagia, unspecified: Secondary | ICD-10-CM | POA: Diagnosis not present

## 2021-07-25 DIAGNOSIS — I69198 Other sequelae of nontraumatic intracerebral hemorrhage: Secondary | ICD-10-CM | POA: Diagnosis not present

## 2021-07-25 DIAGNOSIS — I69191 Dysphagia following nontraumatic intracerebral hemorrhage: Secondary | ICD-10-CM | POA: Diagnosis not present

## 2021-07-25 DIAGNOSIS — I69122 Dysarthria following nontraumatic intracerebral hemorrhage: Secondary | ICD-10-CM | POA: Diagnosis not present

## 2021-07-25 DIAGNOSIS — I69152 Hemiplegia and hemiparesis following nontraumatic intracerebral hemorrhage affecting left dominant side: Secondary | ICD-10-CM | POA: Diagnosis not present

## 2021-07-25 DIAGNOSIS — I69192 Facial weakness following nontraumatic intracerebral hemorrhage: Secondary | ICD-10-CM | POA: Diagnosis not present

## 2021-07-26 DIAGNOSIS — G2581 Restless legs syndrome: Principal | ICD-10-CM

## 2021-07-26 DIAGNOSIS — I619 Nontraumatic intracerebral hemorrhage, unspecified: Principal | ICD-10-CM

## 2021-07-27 DIAGNOSIS — I619 Nontraumatic intracerebral hemorrhage, unspecified: Principal | ICD-10-CM

## 2021-07-27 DIAGNOSIS — G2581 Restless legs syndrome: Principal | ICD-10-CM

## 2021-07-28 DIAGNOSIS — I69191 Dysphagia following nontraumatic intracerebral hemorrhage: Secondary | ICD-10-CM | POA: Diagnosis not present

## 2021-07-28 DIAGNOSIS — I69192 Facial weakness following nontraumatic intracerebral hemorrhage: Secondary | ICD-10-CM | POA: Diagnosis not present

## 2021-07-28 DIAGNOSIS — I69122 Dysarthria following nontraumatic intracerebral hemorrhage: Secondary | ICD-10-CM | POA: Diagnosis not present

## 2021-07-28 DIAGNOSIS — R1310 Dysphagia, unspecified: Secondary | ICD-10-CM | POA: Diagnosis not present

## 2021-07-28 DIAGNOSIS — I69152 Hemiplegia and hemiparesis following nontraumatic intracerebral hemorrhage affecting left dominant side: Secondary | ICD-10-CM | POA: Diagnosis not present

## 2021-07-28 DIAGNOSIS — I69198 Other sequelae of nontraumatic intracerebral hemorrhage: Secondary | ICD-10-CM | POA: Diagnosis not present

## 2021-08-01 ENCOUNTER — Telehealth: Payer: Self-pay | Admitting: Family Medicine

## 2021-08-01 NOTE — Telephone Encounter (Signed)
Requested medication (s) are due for refill today: NO, not on some meds, dose inconsistent with Gabapentin   Requested medication (s) are on the active medication list: No  Last refill:  Plavix, Lipitor, and Cymbalta not on current med list (Historical Provider) Gabapentin at different dose and signature.  Future visit scheduled: 08/23/21, seen 07/07/21  Notes to clinic:  Historical Provider, please assess.       Requested Prescriptions  Pending Prescriptions Disp Refills   clopidogrel (PLAVIX) 75 MG tablet 21 tablet 0    Sig: Take 1 tablet (75 mg total) by mouth daily.     Hematology: Antiplatelets - clopidogrel Failed - 08/01/2021  8:37 AM      Failed - HCT in normal range and within 180 days    HCT  Date Value Ref Range Status  06/02/2021 33.7 (L) 39.0 - 52.0 % Final  10/29/2011 46.2 40.0 - 52.0 % Final         Failed - HGB in normal range and within 180 days    Hemoglobin  Date Value Ref Range Status  06/02/2021 10.8 (L) 13.0 - 17.0 g/dL Final   HGB  Date Value Ref Range Status  10/29/2011 16.2 13.0 - 18.0 g/dL Final         Passed - PLT in normal range and within 180 days    Platelets  Date Value Ref Range Status  06/02/2021 367 150 - 400 K/uL Final   Platelet  Date Value Ref Range Status  10/29/2011 255 150 - 440 x10 3/mm 3 Final         Passed - Cr in normal range and within 360 days    Creatinine  Date Value Ref Range Status  10/29/2011 0.96 0.60 - 1.30 mg/dL Final   Creatinine, Ser  Date Value Ref Range Status  06/02/2021 0.76 0.61 - 1.24 mg/dL Final         Passed - Valid encounter within last 6 months    Recent Outpatient Visits           2 weeks ago Erroneous encounter - disregard   [facility] Juline Patch, [facility]   3 weeks ago Left hemiparesis [facility])   [facility] Juline Patch, MD   5 months ago Diabetes mellitus, new onset ([place])   [facility] Jul[facility] Patch, MD   11 months ago Diabetes mellitus, new onset  ([place])   [facility] Jul[facility], MD   1 year ago Hyperglycemia   [facility]       Future Appointments             In 3 weeks Juline Patch, MD [facility]             gabapentin (NEURONTIN) 300 MG capsule 30 capsule 1    Sig: Take 1 capsule (300 mg total) by mouth at bedtime. Dr Sunday Shams     Neurology: Anticonvulsants - gabapentin Passed - 08/01/2021  8:37 AM      Passed - Cr in normal range and within 360 days    Creatinine  Date Value Ref Range Status  10/29/2011 0.96 0.60 - 1.30 mg/dL Final   Creatinine, Ser  Date Value Ref Range Status  06/02/2021 0.76 0.61 - 1.24 mg/dL Final         Passed - Completed PHQ-2 or PHQ-9 in the last 360 days      Passed - Valid encounter  within last 12 months    Recent Outpatient Visits           2 weeks ago Erroneous encounter - disregard   [facility], MD   3 weeks ago Left hemiparesis [facility])   [facility]   5 months ago Diabetes mellitus, new onset [facility])   [facility] Juline Patch, MD   11 months ago Diabetes mellitus, new onset [facility])   [facility] Juline Patch, MD   1 year ago Hyperglycemia   [facility] Juline Patch, MD       Future Appointments             In 3 weeks Juline Patch, MD [facility]             atorvastatin (LIPITOR) 80 MG tablet 30 tablet 1    Sig: Take 1 tablet (80 mg total) by mouth daily.     Cardiovascular:  Antilipid - Statins Failed - 08/01/2021  8:37 AM      Failed - Lipid Panel in normal range within the last 12 months    Cholesterol, Total  Date Value Ref Range Status  10/18/2016 183 100 - 199 mg/dL Final   Cholesterol  Date Value Ref Range Status  06/01/2021 123 0 - 200 mg/dL Final   LDL Calculated  Date Value Ref Range Status  10/18/2016 123 (H) 0 - 99 mg/dL Final   LDL Cholesterol  Date Value Ref Range  Status  06/01/2021 86 0 - 99 mg/dL Final    Comment:           Total Cholesterol/HDL:CHD Risk Coronary Heart Disease Risk Table                     Men   Women  1/2 Average Risk   3.4   3.3  Average Risk       5.0   4.4  2 X Average Risk   9.6   7.1  3 X Average Risk  23.4   11.0        Use the calculated Patient Ratio above and the CHD Risk Table to determine the patient's CHD Risk.        ATP III CLASSIFICATION (LDL):  <100     mg/dL   Optimal  100-129  mg/dL   Near or Above                    Optimal  130-159  mg/dL   Borderline  160-189  mg/dL   High  >190     mg/dL   Very High Performed at [facility], [place], [place] [address]    HDL  Date Value Ref Range Status  06/01/2021 14 (L) >40 mg/dL Final  10/18/2016 45 >39 mg/dL Final   Triglycerides  Date Value Ref Range Status  06/01/2021 115 <150 mg/dL Final         Passed - Patient is not pregnant      Passed - Valid encounter within last 12 months    Recent Outpatient Visits           2 weeks ago Erroneous encounter - disregard   [facility] Patch, MD   3 weeks ago Left hemiparesis [facility])   [facility] Juline Patch, MD   5 months ago Diabetes mellitus, new onset (  [place])   [facility] Juline Patch, MD   11 months ago Diabetes mellitus, new onset [facility])   [facility], MD   1 year ago Hyperglycemia   [facility], MD       Future Appointments             In 3 weeks Juline Patch, MD [facility]             DULoxetine (CYMBALTA) 30 MG capsule 30 capsule 1    Sig: Take 1 capsule (30 mg total) by mouth daily.     Psychiatry: Antidepressants - SNRI - duloxetine Passed - 08/01/2021  8:37 AM      Passed - Cr in normal range and within 360 days    Creatinine  Date Value Ref Range Status  10/29/2011 0.96 0.60 - 1.30 mg/dL Final   Creatinine, Ser  Date Value Ref  Range Status  06/02/2021 0.76 0.61 - 1.24 mg/dL Final         Passed - eGFR is 30 or above and within 360 days    EGFR (African American)  Date Value Ref Range Status  10/29/2011 >60  Final   GFR calc Af Amer  Date Value Ref Range Status  02/25/2019 >60 >60 mL/min Final   EGFR (Non-African Amer.)  Date Value Ref Range Status  10/29/2011 >60  Final    Comment:    eGFR values <61m/min/1.73 m2 may be an indication of chronic kidney disease (CKD). Calculated eGFR is useful in patients with stable renal function. The eGFR calculation will not be reliable in acutely ill patients when serum creatinine is changing rapidly. It is not useful in  patients on dialysis. The eGFR calculation may not be applicable to patients at the low and high extremes of body sizes, pregnant women, and vegetarians.    GFR, Estimated  Date Value Ref Range Status  06/02/2021 >60 >60 mL/min Final    Comment:    (NOTE) Calculated using the CKD-EPI Creatinine Equation (2021)    eGFR  Date Value Ref Range Status  02/16/2021 103 >59 mL/min/1.73 Final         Passed - Completed PHQ-2 or PHQ-9 in the last 360 days      Passed - Last BP in normal range    BP Readings from Last 1 Encounters:  07/07/21 124/84         Passed - Valid encounter within last 6 months    Recent Outpatient Visits           2 weeks ago [facility]oneous encounter - disregard   [facility]   3 weeks ago [facility] ([facility]   [facility]   5 months ago Diabetes mellitus, new onset ([facility]   [facility]   11 months ago Diabetes mellitus, new onset ([facility]   [facility]   1 year ago Hyperglycemia   [facility]       Future Appointments             In 3 weeks [facility]

## 2021-08-01 NOTE — Telephone Encounter (Signed)
Medication Refill - Medication: Carvedilol 6.25MG   gabapentin (NEURONTIN) 300 MG capsule   Atorvastatin 80MG   Duloxetine 30MG   Chlopidogrel 75MG     Has the patient contacted their pharmacy? No / new medication from hospital  (Agent: If no, request that the patient contact the pharmacy for the refill. If patient does not wish to contact the pharmacy document the reason why and proceed with request.) (Agent: If yes, when and what did the pharmacy advise?)  Preferred Pharmacy (with phone number or street name): [phone] [place] - [address]  [address] [place], [place] [address] [place]  Phone:  [phone]  Fax:  [phone]  Has the patient been seen for an appointment in the last year OR does the patient have an upcoming appointment? Yes.    Agent: Please be advised that RX refills may take up to 3 business days. We ask that you follow-up with your pharmacy.

## 2021-08-02 MED ORDER — DULOXETINE HCL 30 MG PO CPEP: 30.0000 mg | ORAL_CAPSULE | Freq: Every day | ORAL | 1 refills | Status: DC

## 2021-08-02 MED ORDER — ATORVASTATIN CALCIUM 80 MG PO TABS: 80.0000 mg | ORAL_TABLET | Freq: Every day | ORAL | 1 refills | Status: DC

## 2021-08-02 MED ORDER — CLOPIDOGREL BISULFATE 75 MG PO TABS: 75.0000 mg | ORAL_TABLET | Freq: Every day | ORAL | 0 refills | Status: DC

## 2021-08-03 ENCOUNTER — Other Ambulatory Visit: Payer: Self-pay | Admitting: Family Medicine

## 2021-08-03 ENCOUNTER — Telehealth: Payer: Self-pay

## 2021-08-03 DIAGNOSIS — F4312 Post-traumatic stress disorder, chronic: Secondary | ICD-10-CM | POA: Diagnosis not present

## 2021-08-03 DIAGNOSIS — F54 Psychological and behavioral factors associated with disorders or diseases classified elsewhere: Secondary | ICD-10-CM | POA: Diagnosis not present

## 2021-08-03 NOTE — Telephone Encounter (Signed)
I returned pt's call concerning Gabapentin. He was seen in the ER on 6/8 and the physician put in referrals to pain management as well as ortho for him. He was suppose to call those places and get appts. With those physicians. I also gave him the doctors' names and numbers at neurology and rheumatology. He has upcoming appts with both, but can call and see if he can get in sooner from a cancellation

## 2021-08-03 NOTE — Telephone Encounter (Signed)
Medication Refill - Medication: ggabapentin (NEURONTIN) 300 MG  Has the patient contacted their pharmacy? No still pending because states dosage inconsistent and signature also, prescribed by another doctor. Patients states doesn't have contact with the other Dr. And he is all out of this medication (Agent: If no, request that the patient contact the pharmacy for the refill. If patient does not wish to contact the pharmacy document the reason why and proceed with request.) (Agent: If yes, when and what did the pharmacy advise? Still being worked on  Manufacturing systems engineer (with phone number or street name): [id number] [place], [place] - [address]  Phone:  [phone] Has the patient been seen for an appointment in the last year OR does the patient have an upcoming appointment? yes  Agent: Please be advised that RX refills may take up to 3 business days. We ask that you follow-up with your pharmacy.

## 2021-08-04 ENCOUNTER — Encounter: Admit: 2021-08-04 | Discharge: 2021-08-04 | Payer: MEDICARE

## 2021-08-10 DIAGNOSIS — F4312 Post-traumatic stress disorder, chronic: Secondary | ICD-10-CM | POA: Diagnosis not present

## 2021-08-10 DIAGNOSIS — F54 Psychological and behavioral factors associated with disorders or diseases classified elsewhere: Secondary | ICD-10-CM | POA: Diagnosis not present

## 2021-08-17 DIAGNOSIS — F4312 Post-traumatic stress disorder, chronic: Secondary | ICD-10-CM | POA: Diagnosis not present

## 2021-08-17 DIAGNOSIS — F54 Psychological and behavioral factors associated with disorders or diseases classified elsewhere: Secondary | ICD-10-CM | POA: Diagnosis not present

## 2021-08-18 ENCOUNTER — Other Ambulatory Visit: Payer: Self-pay | Admitting: Family Medicine

## 2021-08-18 ENCOUNTER — Telehealth: Payer: Self-pay | Admitting: Family Medicine

## 2021-08-18 DIAGNOSIS — E119 Type 2 diabetes mellitus without complications: Secondary | ICD-10-CM

## 2021-08-18 NOTE — Telephone Encounter (Signed)
PC to pt, would like to speak to you about a Referral to pain management, and eye doctor. Didn't want to give to much more details, informed him I would send the message for you to see when you returned.

## 2021-08-18 NOTE — Telephone Encounter (Signed)
Copied from CRM [id number]. Topic: Referral - Status >> Aug 18, 2021 11:45 AM Patrick Perez wrote: Reason for CRM: Pt requests that Patrick Perez return his call because he has some questions regarding referrals. Pt did not provide any other details. Cb# [phone]

## 2021-08-21 ENCOUNTER — Ambulatory Visit
Admit: 2021-08-21 | Discharge: 2021-08-21 | Disposition: A | Discharge status: Home / Self Care | Payer: MEDICARE | Attending: Student in an Organized Health Care Education/Training Program

## 2021-08-21 ENCOUNTER — Emergency Department
Admit: 2021-08-21 | Discharge: 2021-08-21 | Disposition: A | Discharge status: Home / Self Care | Payer: MEDICARE | Attending: Student in an Organized Health Care Education/Training Program

## 2021-08-21 DIAGNOSIS — R0789 Other chest pain: Principal | ICD-10-CM

## 2021-08-21 DIAGNOSIS — S301XXA Contusion of abdominal wall, initial encounter: Secondary | ICD-10-CM | POA: Diagnosis not present

## 2021-08-21 DIAGNOSIS — Z885 Allergy status to narcotic agent status: Secondary | ICD-10-CM | POA: Diagnosis not present

## 2021-08-21 DIAGNOSIS — S299XXA Unspecified injury of thorax, initial encounter: Secondary | ICD-10-CM | POA: Diagnosis not present

## 2021-08-21 DIAGNOSIS — R0781 Pleurodynia: Secondary | ICD-10-CM | POA: Diagnosis not present

## 2021-08-21 DIAGNOSIS — M5136 Other intervertebral disc degeneration, lumbar region: Secondary | ICD-10-CM | POA: Diagnosis not present

## 2021-08-21 DIAGNOSIS — F17210 Nicotine dependence, cigarettes, uncomplicated: Secondary | ICD-10-CM | POA: Diagnosis not present

## 2021-08-21 DIAGNOSIS — M47816 Spondylosis without myelopathy or radiculopathy, lumbar region: Secondary | ICD-10-CM | POA: Diagnosis not present

## 2021-08-21 DIAGNOSIS — Z7984 Long term (current) use of oral hypoglycemic drugs: Secondary | ICD-10-CM | POA: Diagnosis not present

## 2021-08-21 DIAGNOSIS — I249 Acute ischemic heart disease, unspecified: Secondary | ICD-10-CM | POA: Diagnosis not present

## 2021-08-21 DIAGNOSIS — Z791 Long term (current) use of non-steroidal anti-inflammatories (NSAID): Secondary | ICD-10-CM | POA: Diagnosis not present

## 2021-08-21 DIAGNOSIS — S3992XA Unspecified injury of lower back, initial encounter: Secondary | ICD-10-CM | POA: Diagnosis not present

## 2021-08-21 DIAGNOSIS — M069 Rheumatoid arthritis, unspecified: Secondary | ICD-10-CM | POA: Diagnosis not present

## 2021-08-21 DIAGNOSIS — M25561 Pain in right knee: Secondary | ICD-10-CM | POA: Diagnosis not present

## 2021-08-21 DIAGNOSIS — S29009A Unspecified injury of muscle and tendon of unspecified wall of thorax, initial encounter: Secondary | ICD-10-CM | POA: Diagnosis not present

## 2021-08-21 DIAGNOSIS — R918 Other nonspecific abnormal finding of lung field: Secondary | ICD-10-CM | POA: Diagnosis not present

## 2021-08-21 DIAGNOSIS — E119 Type 2 diabetes mellitus without complications: Secondary | ICD-10-CM | POA: Diagnosis not present

## 2021-08-21 DIAGNOSIS — R000 Tachycardia, unspecified: Secondary | ICD-10-CM | POA: Diagnosis not present

## 2021-08-21 DIAGNOSIS — Z7982 Long term (current) use of aspirin: Secondary | ICD-10-CM | POA: Diagnosis not present

## 2021-08-21 DIAGNOSIS — S7002XA Contusion of left hip, initial encounter: Secondary | ICD-10-CM | POA: Diagnosis not present

## 2021-08-21 MED ORDER — OXYCODONE 5 MG TABLET
ORAL_TABLET | Freq: Four times a day (QID) | ORAL | 0 refills | 3 days | Status: CP | PRN
Start: 2021-08-21 — End: ?

## 2021-08-21 MED ORDER — LIDOCAINE 5 % TOPICAL PATCH
MEDICATED_PATCH | TRANSDERMAL | 0 refills | 30 days | Status: CP
Start: 2021-08-21 — End: 2021-09-20

## 2021-08-21 MED ORDER — LIDOCAINE 4 % TOPICAL PATCH
MEDICATED_PATCH | Freq: Every day | TRANSDERMAL | 0 refills | 7 days | Status: CP
Start: 2021-08-21 — End: 2021-08-28

## 2021-08-22 ENCOUNTER — Telehealth: Payer: Self-pay

## 2021-08-22 ENCOUNTER — Other Ambulatory Visit: Payer: Self-pay

## 2021-08-22 DIAGNOSIS — Z0100 Encounter for examination of eyes and vision without abnormal findings: Secondary | ICD-10-CM

## 2021-08-22 NOTE — Telephone Encounter (Signed)
I returned pt's call concerning Him being put on Metoprolol "while in hospital" Pt's spouse states his "pulse rate is going up when he is in pain." I offered an appointment to follow up with patient and check b/p. Pt and spouse declined stating "I can't keep paying and going to all these doctors." I explained I could put in a referral to cardiology, but I don't have notes to support why we are sending him. He declined appointment. He also wanted referral to pain management. I explained this usually comes from the specialist, he sees neurology. I gave him and spouse all three upcoming appts. To physical med, neurology and rheumatology- docs' names and numbers. I also advised him to call and see if they have cancellations to get in sooner if needed.

## 2021-08-23 ENCOUNTER — Ambulatory Visit: Payer: Medicare Other | Admitting: Family Medicine

## 2021-08-24 ENCOUNTER — Other Ambulatory Visit: Payer: Self-pay | Admitting: Family Medicine

## 2021-08-24 DIAGNOSIS — F54 Psychological and behavioral factors associated with disorders or diseases classified elsewhere: Secondary | ICD-10-CM | POA: Diagnosis not present

## 2021-08-24 DIAGNOSIS — F4312 Post-traumatic stress disorder, chronic: Secondary | ICD-10-CM | POA: Diagnosis not present

## 2021-08-24 NOTE — Telephone Encounter (Signed)
Requested medication (s) are due for refill today: no  Requested medication (s) are on the active medication list: yes  Last refill:  08/02/21  Future visit scheduled: no  Notes to clinic:  Unable to refill per protocol, last refill by provider 08/02/21 for 30 days. Atorvastatin was refilled for 30 days and 1 refill. Reques is too soon.      Requested Prescriptions  Pending Prescriptions Disp Refills   clopidogrel (PLAVIX) 75 MG tablet [Pharmacy Med Name: CLOPIDOGREL 75 MG TABLET] 30 tablet 0    Sig: TAKE 1 TABLET BY MOUTH EVERY DAY     Hematology: Antiplatelets - clopidogrel Failed - 08/24/2021  2:33 PM      Failed - HCT in normal range and within 180 days    HCT  Date Value Ref Range Status  06/02/2021 33.7 (L) 39.0 - 52.0 % Final  10/29/2011 46.2 40.0 - 52.0 % Final         Failed - HGB in normal range and within 180 days    Hemoglobin  Date Value Ref Range Status  06/02/2021 10.8 (L) 13.0 - 17.0 g/dL Final   HGB  Date Value Ref Range Status  10/29/2011 16.2 13.0 - 18.0 g/dL Final         Passed - PLT in normal range and within 180 days    Platelets  Date Value Ref Range Status  06/02/2021 367 150 - 400 K/uL Final   Platelet  Date Value Ref Range Status  10/29/2011 255 150 - 440 x10 3/mm 3 Final         Passed - Cr in normal range and within 360 days    Creatinine  Date Value Ref Range Status  10/29/2011 0.96 0.60 - 1.30 mg/dL Final   Creatinine, Ser  Date Value Ref Range Status  06/02/2021 0.76 0.61 - 1.24 mg/dL Final         Passed - Valid encounter within last 6 months    Recent Outpatient Visits           1 month ago Erroneous encounter - disregard   [facility] Duanne Limerick, MD   1 month ago Left hemiparesis (HCC)   [facility] Duanne Limerick, MD   6 months ago Diabetes mellitus, new onset (HCC)   [facility] Duanne Limerick, MD   1 year ago Diabetes mellitus, new onset (HCC)   [facility] Duanne Limerick, MD   1 year ago Hyperglycemia   [facility] Duanne Limerick, MD               atorvastatin (LIPITOR) 80 MG tablet [Pharmacy Med Name: ATORVASTATIN 80 MG TABLET] 30 tablet 1    Sig: TAKE 1 TABLET BY MOUTH EVERY DAY     Cardiovascular:  Antilipid - Statins Failed - 08/24/2021  2:33 PM      Failed - Lipid Panel in normal range within the last 12 months    Cholesterol, Total  Date Value Ref Range Status  10/18/2016 183 100 - 199 mg/dL Final   Cholesterol  Date Value Ref Range Status  06/01/2021 123 0 - 200 mg/dL Final   LDL Calculated  Date Value Ref Range Status  10/18/2016 123 (H) 0 - 99 mg/dL Final   LDL Cholesterol  Date Value Ref Range Status  06/01/2021 86 0 - 99 mg/dL Final    Comment:           Total Cholesterol/HDL:CHD  Risk Coronary Heart Disease Risk Table                     Men   Women  1/2 Average Risk   3.4   3.3  Average Risk       5.0   4.4  2 X Average Risk   9.6   7.1  3 X Average Risk  23.4   11.0        Use the calculated Patient Ratio above and the CHD Risk Table to determine the patient's CHD Risk.        ATP III CLASSIFICATION (LDL):  <100     mg/dL   Optimal  127-517  mg/dL   Near or Above                    Optimal  130-159  mg/dL   Borderline  001-749  mg/dL   High  >449     mg/dL   Very High Performed at [facility], [address] [place], [place] [address]    HDL  Date Value Ref Range Status  06/01/2021 14 (L) >40 mg/dL Final  63/84/6659 45 >93 mg/dL Final   Triglycerides  Date Value Ref Range Status  06/01/2021 115 <150 mg/dL Final         Passed - Patient is not pregnant      Passed - Valid encounter within last 12 months    Recent Outpatient Visits           1 month ago Erroneous encounter - disregard   [facility] Duanne Limerick, MD   1 month ago Left hemiparesis [facility])   [facility] Duanne Limerick, MD   6 months ago Diabetes mellitus, new onset [facility])   [facility] Duanne Limerick, MD   1 year ago Diabetes mellitus, new onset [facility])   [facility] Duanne Limerick, MD   1 year ago Hyperglycemia   [facility] Duanne Limerick, MD

## 2021-08-24 NOTE — Telephone Encounter (Signed)
Requested medication (s) are due for refill today: no  Requested medication (s) are on the active medication list: yes  Last refill:  08/02/21  Future visit scheduled:yes  Notes to clinic:  Unable to refill per protocol, last refill by provider 08/02/21 for 30 and 1 refill. Pharmacy note request 90 day supply.     Requested Prescriptions  Pending Prescriptions Disp Refills   DULoxetine (CYMBALTA) 30 MG capsule [Pharmacy Med Name: DULOXETINE HCL DR 30 MG CAP] 90 capsule 1    Sig: TAKE 1 CAPSULE BY MOUTH EVERY DAY     Psychiatry: Antidepressants - SNRI - duloxetine Passed - 08/24/2021  2:32 PM      Passed - Cr in normal range and within 360 days    Creatinine  Date Value Ref Range Status  10/29/2011 0.96 0.60 - 1.30 mg/dL Final   Creatinine, Ser  Date Value Ref Range Status  06/02/2021 0.76 0.61 - 1.24 mg/dL Final         Passed - eGFR is 30 or above and within 360 days    EGFR (African American)  Date Value Ref Range Status  10/29/2011 >60  Final   GFR calc Af Amer  Date Value Ref Range Status  02/25/2019 >60 >60 mL/min Final   EGFR (Non-African Amer.)  Date Value Ref Range Status  10/29/2011 >60  Final    Comment:    eGFR values <23m/min/1.73 m2 may be an indication of chronic kidney disease (CKD). Calculated eGFR is useful in patients with stable renal function. The eGFR calculation will not be reliable in acutely ill patients when serum creatinine is changing rapidly. It is not useful in  patients on dialysis. The eGFR calculation may not be applicable to patients at the low and high extremes of body sizes, pregnant women, and vegetarians.    GFR, Estimated  Date Value Ref Range Status  06/02/2021 >60 >60 mL/min Final    Comment:    (NOTE) Calculated using the CKD-EPI Creatinine Equation (2021)    eGFR  Date Value Ref Range Status  02/16/2021 103 >59 mL/min/1.73 Final         Passed - Completed PHQ-2 or PHQ-9 in the last 360 days      Passed - Last  BP in normal range    BP Readings from Last 1 Encounters:  07/07/21 124/84         Passed - Valid encounter within last 6 months    Recent Outpatient Visits           1 month ago [facility]one[facility]   [facility]   6 months ago Diabetes mellitus, new onset ([facility]   [facility] JJuline Patch MD   1 year ago Diabetes mellitus, new onset ([facility]   [facility] JJuline Patch MD   1 year ago Hyperglycemia   [facility] JJuline Patch MD

## 2021-08-31 ENCOUNTER — Ambulatory Visit: Admit: 2021-08-31 | Discharge: 2021-09-01 | Payer: MEDICARE

## 2021-08-31 DIAGNOSIS — I10 Essential (primary) hypertension: Principal | ICD-10-CM

## 2021-08-31 DIAGNOSIS — Z8673 Personal history of transient ischemic attack (TIA), and cerebral infarction without residual deficits: Principal | ICD-10-CM

## 2021-08-31 DIAGNOSIS — F4312 Post-traumatic stress disorder, chronic: Secondary | ICD-10-CM | POA: Diagnosis not present

## 2021-08-31 DIAGNOSIS — F54 Psychological and behavioral factors associated with disorders or diseases classified elsewhere: Secondary | ICD-10-CM | POA: Diagnosis not present

## 2021-08-31 MED ORDER — DULOXETINE 30 MG CAPSULE,DELAYED RELEASE
ORAL_CAPSULE | Freq: Every day | ORAL | 3 refills | 30 days | Status: CP
Start: 2021-08-31 — End: ?

## 2021-08-31 MED ORDER — METFORMIN 500 MG TABLET
ORAL_TABLET | Freq: Two times a day (BID) | ORAL | 3 refills | 30 days | Status: CP
Start: 2021-08-31 — End: ?

## 2021-08-31 MED ORDER — ATORVASTATIN 80 MG TABLET
ORAL_TABLET | Freq: Every evening | ORAL | 3 refills | 30 days | Status: CP
Start: 2021-08-31 — End: ?

## 2021-08-31 MED ORDER — METOPROLOL TARTRATE 25 MG TABLET
ORAL_TABLET | Freq: Two times a day (BID) | ORAL | 3 refills | 30 days | Status: CP
Start: 2021-08-31 — End: ?

## 2021-08-31 MED ORDER — ASPIRIN 81 MG CHEWABLE TABLET
ORAL_TABLET | Freq: Every day | ORAL | 3 refills | 30 days | Status: CP
Start: 2021-08-31 — End: ?

## 2021-08-31 MED ORDER — GABAPENTIN 300 MG CAPSULE
ORAL_CAPSULE | Freq: Three times a day (TID) | ORAL | 3 refills | 30 days | Status: CP
Start: 2021-08-31 — End: ?

## 2021-09-04 ENCOUNTER — Other Ambulatory Visit: Payer: Self-pay | Admitting: Family Medicine

## 2021-09-04 DIAGNOSIS — I10 Essential (primary) hypertension: Principal | ICD-10-CM

## 2021-09-04 DIAGNOSIS — G894 Chronic pain syndrome: Principal | ICD-10-CM

## 2021-09-04 DIAGNOSIS — I619 Nontraumatic intracerebral hemorrhage, unspecified: Principal | ICD-10-CM

## 2021-09-04 DIAGNOSIS — M069 Rheumatoid arthritis, unspecified: Principal | ICD-10-CM

## 2021-09-06 ENCOUNTER — Encounter: Admit: 2021-09-06 | Discharge: 2021-10-05 | Payer: MEDICARE

## 2021-09-06 ENCOUNTER — Encounter: Admit: 2021-09-06 | Payer: MEDICARE

## 2021-09-06 ENCOUNTER — Inpatient Hospital Stay: Admit: 2021-09-06 | Payer: MEDICARE

## 2021-09-08 ENCOUNTER — Other Ambulatory Visit: Payer: Self-pay | Admitting: Family Medicine

## 2021-09-08 DIAGNOSIS — I10 Essential (primary) hypertension: Principal | ICD-10-CM

## 2021-09-08 DIAGNOSIS — I619 Nontraumatic intracerebral hemorrhage, unspecified: Principal | ICD-10-CM

## 2021-09-08 DIAGNOSIS — M069 Rheumatoid arthritis, unspecified: Principal | ICD-10-CM

## 2021-09-08 DIAGNOSIS — G894 Chronic pain syndrome: Principal | ICD-10-CM

## 2021-09-08 DIAGNOSIS — M0579 Rheumatoid arthritis with rheumatoid factor of multiple sites without organ or systems involvement: Secondary | ICD-10-CM

## 2021-09-12 DIAGNOSIS — I619 Nontraumatic intracerebral hemorrhage, unspecified: Principal | ICD-10-CM

## 2021-09-12 DIAGNOSIS — G894 Chronic pain syndrome: Principal | ICD-10-CM

## 2021-09-12 DIAGNOSIS — I10 Essential (primary) hypertension: Principal | ICD-10-CM

## 2021-09-12 DIAGNOSIS — M069 Rheumatoid arthritis, unspecified: Principal | ICD-10-CM

## 2021-09-13 DIAGNOSIS — G894 Chronic pain syndrome: Principal | ICD-10-CM

## 2021-09-13 DIAGNOSIS — I619 Nontraumatic intracerebral hemorrhage, unspecified: Principal | ICD-10-CM

## 2021-09-13 DIAGNOSIS — M069 Rheumatoid arthritis, unspecified: Principal | ICD-10-CM

## 2021-09-13 DIAGNOSIS — I10 Essential (primary) hypertension: Principal | ICD-10-CM

## 2021-09-14 DIAGNOSIS — F4312 Post-traumatic stress disorder, chronic: Secondary | ICD-10-CM | POA: Diagnosis not present

## 2021-09-14 DIAGNOSIS — F54 Psychological and behavioral factors associated with disorders or diseases classified elsewhere: Secondary | ICD-10-CM | POA: Diagnosis not present

## 2021-09-21 DIAGNOSIS — F4312 Post-traumatic stress disorder, chronic: Secondary | ICD-10-CM | POA: Diagnosis not present

## 2021-09-21 DIAGNOSIS — F54 Psychological and behavioral factors associated with disorders or diseases classified elsewhere: Secondary | ICD-10-CM | POA: Diagnosis not present

## 2021-09-27 DIAGNOSIS — I619 Nontraumatic intracerebral hemorrhage, unspecified: Principal | ICD-10-CM

## 2021-09-27 DIAGNOSIS — I10 Essential (primary) hypertension: Principal | ICD-10-CM

## 2021-09-27 DIAGNOSIS — M069 Rheumatoid arthritis, unspecified: Principal | ICD-10-CM

## 2021-09-27 DIAGNOSIS — G894 Chronic pain syndrome: Principal | ICD-10-CM

## 2021-09-28 ENCOUNTER — Ambulatory Visit
Admit: 2021-09-28 | Discharge: 2021-09-29 | Payer: MEDICARE | Attending: Student in an Organized Health Care Education/Training Program | Primary: Student in an Organized Health Care Education/Training Program

## 2021-09-28 DIAGNOSIS — I619 Nontraumatic intracerebral hemorrhage, unspecified: Principal | ICD-10-CM

## 2021-09-28 DIAGNOSIS — I10 Essential (primary) hypertension: Principal | ICD-10-CM

## 2021-09-28 DIAGNOSIS — Z789 Other specified health status: Principal | ICD-10-CM

## 2021-09-28 DIAGNOSIS — Z7409 Other reduced mobility: Principal | ICD-10-CM

## 2021-09-28 DIAGNOSIS — F4312 Post-traumatic stress disorder, chronic: Secondary | ICD-10-CM | POA: Diagnosis not present

## 2021-09-28 DIAGNOSIS — G890 Central pain syndrome: Secondary | ICD-10-CM | POA: Diagnosis not present

## 2021-09-28 DIAGNOSIS — E119 Type 2 diabetes mellitus without complications: Secondary | ICD-10-CM | POA: Diagnosis not present

## 2021-09-28 DIAGNOSIS — F54 Psychological and behavioral factors associated with disorders or diseases classified elsewhere: Secondary | ICD-10-CM | POA: Diagnosis not present

## 2021-09-28 DIAGNOSIS — I639 Cerebral infarction, unspecified: Secondary | ICD-10-CM | POA: Diagnosis not present

## 2021-09-28 MED ORDER — ATORVASTATIN 80 MG TABLET
ORAL_TABLET | Freq: Every evening | ORAL | 11 refills | 30 days | Status: CP
Start: 2021-09-28 — End: 2022-09-28

## 2021-09-28 MED ORDER — AMITRIPTYLINE 25 MG TABLET
ORAL_TABLET | Freq: Every evening | ORAL | 3 refills | 90 days | Status: CP
Start: 2021-09-28 — End: 2022-09-28

## 2021-09-30 DIAGNOSIS — I619 Nontraumatic intracerebral hemorrhage, unspecified: Principal | ICD-10-CM

## 2021-09-30 DIAGNOSIS — M069 Rheumatoid arthritis, unspecified: Principal | ICD-10-CM

## 2021-09-30 DIAGNOSIS — G894 Chronic pain syndrome: Principal | ICD-10-CM

## 2021-09-30 DIAGNOSIS — I10 Essential (primary) hypertension: Principal | ICD-10-CM

## 2021-10-05 DIAGNOSIS — F54 Psychological and behavioral factors associated with disorders or diseases classified elsewhere: Secondary | ICD-10-CM | POA: Diagnosis not present

## 2021-10-05 DIAGNOSIS — F4312 Post-traumatic stress disorder, chronic: Secondary | ICD-10-CM | POA: Diagnosis not present

## 2021-10-06 ENCOUNTER — Encounter: Admit: 2021-10-06 | Payer: MEDICARE

## 2021-10-20 ENCOUNTER — Ambulatory Visit: Admit: 2021-10-20 | Payer: MEDICARE

## 2021-10-26 DIAGNOSIS — F4312 Post-traumatic stress disorder, chronic: Secondary | ICD-10-CM | POA: Diagnosis not present

## 2021-10-26 DIAGNOSIS — F54 Psychological and behavioral factors associated with disorders or diseases classified elsewhere: Secondary | ICD-10-CM | POA: Diagnosis not present

## 2021-11-09 DIAGNOSIS — F54 Psychological and behavioral factors associated with disorders or diseases classified elsewhere: Secondary | ICD-10-CM | POA: Diagnosis not present

## 2021-11-09 DIAGNOSIS — F4312 Post-traumatic stress disorder, chronic: Secondary | ICD-10-CM | POA: Diagnosis not present

## 2021-11-12 ENCOUNTER — Emergency Department
Admit: 2021-11-12 | Discharge: 2021-11-12 | Disposition: A | Discharge status: Home / Self Care | Payer: MEDICARE | Attending: Student in an Organized Health Care Education/Training Program

## 2021-11-12 ENCOUNTER — Ambulatory Visit
Admit: 2021-11-12 | Discharge: 2021-11-12 | Disposition: A | Discharge status: Home / Self Care | Payer: MEDICARE | Attending: Student in an Organized Health Care Education/Training Program

## 2021-11-12 DIAGNOSIS — S52602A Unspecified fracture of lower end of left ulna, initial encounter for closed fracture: Principal | ICD-10-CM

## 2021-11-12 DIAGNOSIS — Z79899 Other long term (current) drug therapy: Secondary | ICD-10-CM | POA: Diagnosis not present

## 2021-11-12 DIAGNOSIS — M79632 Pain in left forearm: Secondary | ICD-10-CM | POA: Diagnosis not present

## 2021-11-12 DIAGNOSIS — S52692A Other fracture of lower end of left ulna, initial encounter for closed fracture: Secondary | ICD-10-CM | POA: Diagnosis not present

## 2021-11-12 DIAGNOSIS — I10 Essential (primary) hypertension: Secondary | ICD-10-CM | POA: Diagnosis not present

## 2021-11-12 DIAGNOSIS — Z87891 Personal history of nicotine dependence: Secondary | ICD-10-CM | POA: Diagnosis not present

## 2021-11-12 DIAGNOSIS — E119 Type 2 diabetes mellitus without complications: Secondary | ICD-10-CM | POA: Diagnosis not present

## 2021-11-12 DIAGNOSIS — Z7982 Long term (current) use of aspirin: Secondary | ICD-10-CM | POA: Diagnosis not present

## 2021-11-12 DIAGNOSIS — S4992XA Unspecified injury of left shoulder and upper arm, initial encounter: Secondary | ICD-10-CM | POA: Diagnosis not present

## 2021-11-12 DIAGNOSIS — M25512 Pain in left shoulder: Secondary | ICD-10-CM | POA: Diagnosis not present

## 2021-11-12 DIAGNOSIS — Z8673 Personal history of transient ischemic attack (TIA), and cerebral infarction without residual deficits: Secondary | ICD-10-CM | POA: Diagnosis not present

## 2021-11-12 DIAGNOSIS — M069 Rheumatoid arthritis, unspecified: Secondary | ICD-10-CM | POA: Diagnosis not present

## 2021-11-12 DIAGNOSIS — Z7984 Long term (current) use of oral hypoglycemic drugs: Secondary | ICD-10-CM | POA: Diagnosis not present

## 2021-11-12 DIAGNOSIS — S52252A Displaced comminuted fracture of shaft of ulna, left arm, initial encounter for closed fracture: Secondary | ICD-10-CM | POA: Diagnosis not present

## 2021-11-12 DIAGNOSIS — M6281 Muscle weakness (generalized): Secondary | ICD-10-CM | POA: Diagnosis not present

## 2021-11-12 MED ORDER — OXYCODONE 5 MG TABLET
ORAL_TABLET | ORAL | 0 refills | 2 days | Status: CP | PRN
Start: 2021-11-12 — End: 2021-11-17

## 2021-11-14 ENCOUNTER — Ambulatory Visit
Admit: 2021-11-14 | Discharge: 2021-11-15 | Payer: MEDICARE | Attending: Student in an Organized Health Care Education/Training Program | Primary: Student in an Organized Health Care Education/Training Program

## 2021-11-14 DIAGNOSIS — Z1329 Encounter for screening for other suspected endocrine disorder: Principal | ICD-10-CM

## 2021-11-14 DIAGNOSIS — Z130 Encounter for screening for diseases of the blood and blood-forming organs and certain disorders involving the immune mechanism: Principal | ICD-10-CM

## 2021-11-14 DIAGNOSIS — D509 Iron deficiency anemia, unspecified: Principal | ICD-10-CM

## 2021-11-14 DIAGNOSIS — Z1211 Encounter for screening for malignant neoplasm of colon: Principal | ICD-10-CM

## 2021-11-14 DIAGNOSIS — Z1159 Encounter for screening for other viral diseases: Principal | ICD-10-CM

## 2021-11-14 DIAGNOSIS — E1165 Type 2 diabetes mellitus with hyperglycemia: Principal | ICD-10-CM

## 2021-11-14 DIAGNOSIS — F1920 Other psychoactive substance dependence, uncomplicated: Principal | ICD-10-CM

## 2021-11-14 DIAGNOSIS — Z13228 Encounter for screening for other metabolic disorders: Principal | ICD-10-CM

## 2021-11-14 DIAGNOSIS — Z7409 Other reduced mobility: Principal | ICD-10-CM

## 2021-11-14 DIAGNOSIS — M069 Rheumatoid arthritis, unspecified: Principal | ICD-10-CM

## 2021-11-14 DIAGNOSIS — Z13220 Encounter for screening for lipoid disorders: Principal | ICD-10-CM

## 2021-11-14 DIAGNOSIS — Z125 Encounter for screening for malignant neoplasm of prostate: Principal | ICD-10-CM

## 2021-11-14 DIAGNOSIS — Z789 Other specified health status: Principal | ICD-10-CM

## 2021-11-14 DIAGNOSIS — Z114 Encounter for screening for human immunodeficiency virus [HIV]: Principal | ICD-10-CM

## 2021-11-14 DIAGNOSIS — I10 Essential (primary) hypertension: Principal | ICD-10-CM

## 2021-11-14 DIAGNOSIS — Z23 Encounter for immunization: Secondary | ICD-10-CM | POA: Diagnosis not present

## 2021-11-15 ENCOUNTER — Telehealth: Payer: Self-pay

## 2021-11-15 NOTE — Telephone Encounter (Signed)
ERROR

## 2021-11-16 DIAGNOSIS — F54 Psychological and behavioral factors associated with disorders or diseases classified elsewhere: Secondary | ICD-10-CM | POA: Diagnosis not present

## 2021-11-16 DIAGNOSIS — F4312 Post-traumatic stress disorder, chronic: Secondary | ICD-10-CM | POA: Diagnosis not present

## 2021-11-26 DIAGNOSIS — I10 Essential (primary) hypertension: Principal | ICD-10-CM

## 2021-11-26 DIAGNOSIS — Z8673 Personal history of transient ischemic attack (TIA), and cerebral infarction without residual deficits: Principal | ICD-10-CM

## 2021-11-30 MED ORDER — METFORMIN 500 MG TABLET
ORAL_TABLET | Freq: Two times a day (BID) | ORAL | 1 refills | 90.00000 days | Status: CP
Start: 2021-11-30 — End: 2021-11-30

## 2021-11-30 MED ORDER — CYCLOBENZAPRINE 10 MG TABLET
ORAL_TABLET | Freq: Three times a day (TID) | ORAL | 1 refills | 30 days | Status: CP | PRN
Start: 2021-11-30 — End: 2022-11-30

## 2021-12-07 ENCOUNTER — Ambulatory Visit: Admit: 2021-12-07 | Discharge: 2021-12-08 | Payer: MEDICARE

## 2021-12-07 DIAGNOSIS — S52202D Unspecified fracture of shaft of left ulna, subsequent encounter for closed fracture with routine healing: Secondary | ICD-10-CM | POA: Diagnosis not present

## 2021-12-07 DIAGNOSIS — S52602A Unspecified fracture of lower end of left ulna, initial encounter for closed fracture: Secondary | ICD-10-CM | POA: Diagnosis not present

## 2021-12-07 DIAGNOSIS — S52202A Unspecified fracture of shaft of left ulna, initial encounter for closed fracture: Secondary | ICD-10-CM | POA: Diagnosis not present

## 2021-12-12 ENCOUNTER — Ambulatory Visit
Admit: 2021-12-12 | Discharge: 2021-12-13 | Payer: MEDICARE | Attending: Student in an Organized Health Care Education/Training Program | Primary: Student in an Organized Health Care Education/Training Program

## 2021-12-12 DIAGNOSIS — G8929 Other chronic pain: Principal | ICD-10-CM

## 2021-12-12 DIAGNOSIS — L97921 Non-pressure chronic ulcer of unspecified part of left lower leg limited to breakdown of skin: Principal | ICD-10-CM

## 2021-12-12 DIAGNOSIS — M7989 Other specified soft tissue disorders: Principal | ICD-10-CM

## 2021-12-12 DIAGNOSIS — E119 Type 2 diabetes mellitus without complications: Principal | ICD-10-CM

## 2021-12-12 DIAGNOSIS — Z23 Encounter for immunization: Principal | ICD-10-CM

## 2021-12-12 DIAGNOSIS — G2581 Restless legs syndrome: Principal | ICD-10-CM

## 2021-12-12 DIAGNOSIS — F418 Other specified anxiety disorders: Principal | ICD-10-CM

## 2021-12-12 DIAGNOSIS — M069 Rheumatoid arthritis, unspecified: Principal | ICD-10-CM

## 2021-12-12 DIAGNOSIS — I10 Essential (primary) hypertension: Principal | ICD-10-CM

## 2021-12-21 DIAGNOSIS — F4312 Post-traumatic stress disorder, chronic: Secondary | ICD-10-CM | POA: Diagnosis not present

## 2021-12-21 DIAGNOSIS — F54 Psychological and behavioral factors associated with disorders or diseases classified elsewhere: Secondary | ICD-10-CM | POA: Diagnosis not present

## 2021-12-28 DIAGNOSIS — F54 Psychological and behavioral factors associated with disorders or diseases classified elsewhere: Secondary | ICD-10-CM | POA: Diagnosis not present

## 2021-12-28 DIAGNOSIS — F4312 Post-traumatic stress disorder, chronic: Secondary | ICD-10-CM | POA: Diagnosis not present

## 2021-12-29 ENCOUNTER — Ambulatory Visit
Admit: 2021-12-29 | Payer: MEDICARE | Attending: Rehabilitative and Restorative Service Providers" | Primary: Rehabilitative and Restorative Service Providers"

## 2022-01-04 DIAGNOSIS — F4312 Post-traumatic stress disorder, chronic: Secondary | ICD-10-CM | POA: Diagnosis not present

## 2022-01-04 DIAGNOSIS — F54 Psychological and behavioral factors associated with disorders or diseases classified elsewhere: Secondary | ICD-10-CM | POA: Diagnosis not present

## 2022-01-09 ENCOUNTER — Ambulatory Visit
Admit: 2022-01-09 | Discharge: 2022-01-10 | Payer: MEDICARE | Attending: Student in an Organized Health Care Education/Training Program | Primary: Student in an Organized Health Care Education/Training Program

## 2022-01-09 DIAGNOSIS — M069 Rheumatoid arthritis, unspecified: Principal | ICD-10-CM

## 2022-01-09 DIAGNOSIS — Z23 Encounter for immunization: Principal | ICD-10-CM

## 2022-01-09 DIAGNOSIS — Z9181 History of falling: Principal | ICD-10-CM

## 2022-01-09 DIAGNOSIS — Z5181 Encounter for therapeutic drug level monitoring: Principal | ICD-10-CM

## 2022-01-09 DIAGNOSIS — Z8673 Personal history of transient ischemic attack (TIA), and cerebral infarction without residual deficits: Principal | ICD-10-CM

## 2022-01-09 DIAGNOSIS — F331 Major depressive disorder, recurrent, moderate: Principal | ICD-10-CM

## 2022-01-09 DIAGNOSIS — E119 Type 2 diabetes mellitus without complications: Principal | ICD-10-CM

## 2022-01-09 MED ORDER — ATORVASTATIN 80 MG TABLET
ORAL_TABLET | Freq: Every evening | ORAL | 0 refills | 180 days | Status: CP
Start: 2022-01-09 — End: 2022-07-08

## 2022-01-11 ENCOUNTER — Ambulatory Visit: Admit: 2022-01-11 | Discharge: 2022-01-12 | Payer: MEDICARE

## 2022-01-11 DIAGNOSIS — L97921 Non-pressure chronic ulcer of unspecified part of left lower leg limited to breakdown of skin: Principal | ICD-10-CM

## 2022-01-11 DIAGNOSIS — G2581 Restless legs syndrome: Principal | ICD-10-CM

## 2022-01-11 DIAGNOSIS — M7989 Other specified soft tissue disorders: Principal | ICD-10-CM

## 2022-01-11 DIAGNOSIS — E119 Type 2 diabetes mellitus without complications: Secondary | ICD-10-CM | POA: Diagnosis not present

## 2022-01-11 DIAGNOSIS — L97929 Non-pressure chronic ulcer of unspecified part of left lower leg with unspecified severity: Secondary | ICD-10-CM | POA: Diagnosis not present

## 2022-01-11 DIAGNOSIS — I10 Essential (primary) hypertension: Secondary | ICD-10-CM | POA: Diagnosis not present

## 2022-01-11 DIAGNOSIS — F17290 Nicotine dependence, other tobacco product, uncomplicated: Secondary | ICD-10-CM | POA: Diagnosis not present

## 2022-01-11 DIAGNOSIS — E785 Hyperlipidemia, unspecified: Secondary | ICD-10-CM | POA: Diagnosis not present

## 2022-01-11 DIAGNOSIS — E11622 Type 2 diabetes mellitus with other skin ulcer: Secondary | ICD-10-CM | POA: Diagnosis not present

## 2022-01-12 ENCOUNTER — Ambulatory Visit: Admit: 2022-01-12 | Discharge: 2022-01-13 | Payer: MEDICARE | Attending: Internal Medicine | Primary: Internal Medicine

## 2022-01-12 DIAGNOSIS — E119 Type 2 diabetes mellitus without complications: Principal | ICD-10-CM

## 2022-01-12 DIAGNOSIS — M0579 Rheumatoid arthritis with rheumatoid factor of multiple sites without organ or systems involvement: Principal | ICD-10-CM

## 2022-01-12 MED ORDER — METHOTREXATE INJECTION (CONTAINS PRESERVATIVE) 25 MG/ML (NON-ONCOLOGY)
SUBCUTANEOUS | 3 refills | 42 days | Status: CP
Start: 2022-01-12 — End: 2022-01-12

## 2022-01-12 MED ORDER — SYRINGE WITH NEEDLE 1 ML 25 GAUGE X 5/8"
0 refills | 0 days | Status: CP
Start: 2022-01-12 — End: ?

## 2022-01-12 MED ORDER — SIMPONI 50 MG/0.5 ML SUBCUTANEOUS PEN INJECTOR
SUBCUTANEOUS | 3 refills | 168 days | Status: CP
Start: 2022-01-12 — End: ?
  Filled 2022-01-25: qty 0.5, 28d supply, fill #0

## 2022-01-12 MED ORDER — METHOTREXATE SODIUM (PF) 25 MG/ML INJECTION SOLUTION
3 refills | 0 days | Status: CP
Start: 2022-01-12 — End: ?

## 2022-01-12 MED ORDER — TRAMADOL 50 MG TABLET
ORAL_TABLET | Freq: Two times a day (BID) | ORAL | 3 refills | 30.00000 days | Status: CP | PRN
Start: 2022-01-12 — End: 2022-01-12

## 2022-01-12 NOTE — Unmapped (Addendum)
RHEUMATOLOGY NEW CLINIC NOTE    Assessment/Plan:  Paul Hurst is a 56 y.o. male with a longstanding history of seropositive erosive rheumatoid arthritis, previously followed by [name], here to establish care.  He unfortunately has had multiple complications including the development of diabetes and stroke x2.  He has also had an ulnar shaft fracture on the left.  He continues to smoke.  He has not had treatment for his rheumatoid arthritis in over a year.    -- Start subc methotrexate 25 mg once per week  -- Continue folic acid daily  --Start Simponi monthly-sent to [employer].  He may need to get this through medication assistance  -- Outside labs reviewed from Dr. Alfredo Batty.  He has a negative QuantiFERON that was checked in 2021, negative hepatitis C antibody  -- Patient requested a prescription for tramadol today.  I prescribed 50 mg to use every 12 hours as needed.  -- Return in 4 months with Brennan Bailey and 8 months with me    History of Present Illness:  The patient was seen in consultation at the request of refills  for the evaluation of erosive seropositive RA.    Primary Care Provider: Rosine Beat, Benison Pap, DO    Paul Hurst is a 56 y.o.  male with stroke, DM, RLS, HTN here for RA.  Last saw [facility] rheumatology in 2022 who recommended remicade but he never went back there.     Previous rheum [employer]. Was taking simponi and methotrexate at that time which worked pretty well. Enbrel, Humira and Orencia with no effect.  Also treated at 1 point with Harriette Ohara.  Simponi helped. He was last on treatment a year ago.     He unfortunately broke the left arm (left ulnar shaft fracture) after it got caught between the wheel and chair of his wheelchair.     He needs a letter starting that his RA is due to [employer] in [place] ([place])----he served in [employer].     Allergies:  Seroquel [quetiapine]    Medications:   Outpatient Medications Prior to Visit Medication Sig Dispense Refill    acetaminophen (TYLENOL) 500 MG tablet Take 1 tablet (500 mg total) by mouth every six (6) hours as needed for pain.      amitriptyline (ELAVIL) 25 MG tablet Take 1 tablet (25 mg total) by mouth nightly. 90 tablet 3    aspirin 81 MG chewable tablet Chew 1 tablet (81 mg total) daily. 30 tablet 3    atorvastatin (LIPITOR) 80 MG tablet Take 1 tablet (80 mg total) by mouth every evening. 180 tablet 0    carvediloL (COREG) 6.25 MG tablet Take 1 tablet (6.25 mg total) by mouth two (2) times a day.      cholecalciferol, vitamin D3-10 mcg, 400 unit,, 10 mcg (400 unit) Tab Take 5 tablets (50 mcg total) by mouth daily.      clopidogreL (PLAVIX) 75 mg tablet Take 1 tablet (75 mg total) by mouth daily.      cyanocobalamin/folic acid (VITAMIN B12-FOLIC ACID) 1,000-400 mcg Lozg Take 1,000 mcg by mouth daily with evening meal.      cyclobenzaprine (FLEXERIL) 10 MG tablet Take 1 tablet (10 mg total) by mouth Three (3) times a day as needed. 90 tablet 1    ferrous sulfate 325 (65 FE) MG tablet Take 1 tablet (325 mg total) by mouth every other day. 15 tablet 11    folic acid (FOLVITE)  1 MG tablet Take 1 tablet (1 mg total) by mouth daily. (Patient taking differently: Take 800 mcg by mouth daily.) 30 tablet 6    gabapentin (NEURONTIN) 300 MG capsule Take 1 capsule (300 mg total) by mouth Three (3) times a day. 90 capsule 3    ibuprofen (ADVIL) 200 MG tablet Take 1 tablet (200 mg total) by mouth every six (6) hours as needed for pain.      melatonin 10 mg Chew Chew 10 mg nightly.      metFORMIN (GLUCOPHAGE) 500 MG tablet Take 1 tablet (500 mg total) by mouth in the morning and 1 tablet (500 mg total) in the evening. Take with meals. 180 tablet 1    nitroglycerin (NITROSTAT) 0.4 MG SL tablet Place 1 tablet (0.4 mg total) under the tongue every five (5) minutes as needed for chest pain. Maximum of 3 doses in 15 minutes. 25 tablet 0    ondansetron (ZOFRAN) 4 MG tablet Take 1 tablet (4 mg total) by mouth every twelve (12) hours as needed for nausea.       No facility-administered medications prior to visit.     Medical History:  Past Medical History:   Diagnosis Date    Diabetes mellitus (CMS-HCC)     Rheumatoid arthritis(714.0)     Stroke (CMS-HCC)      Surgical History:  No past surgical history on file.    Social History:  Social History     Tobacco Use    Smoking status: Former     Types: Cigarettes     Quit date: 04/29/2020     Years since quitting: 1.7    Smokeless tobacco: Never     Family History:  No family history on file.    Review of Systems: Please see above in the HPI, the remainder of a 10-system review was unremarkable.    Objective   Vitals:    01/12/22 0939   BP: 114/79   Pulse: 106   Weight: 83.1 kg (183 lb 3.2 oz)   Height: 170.2 cm (5' 7)     Body mass index is 28.69 kg/m??.  Physical Exam    General:   Tearful at times, accompanied by his wife and daughter   Eyes:   PERRL, EOMI, conjunctivae are clear.   ENT:   MMM. Oropharhynx without any erythema or exudate.   Neck:   Supple, full ROM   Skin:    No diffuse rash.   Psychiatry:   Alert and oriented to person, place, and time   Extremities:   Warm and well perfused. No edema. 2+ pulses.   Musculo Skeletal:   Synovitis and TTP in the right 2,3 and 5 MCP joints  TTP in the right 2-5 PIP joints  Difficult to examine the left hand due to brace but the PIPs look swollen  Knees with FROM  Right foot MTPs all TTP   Left foot is cold secondary to stroke           Swollen Joint Count (0-28): 3  Tender Joint Count (0-28): 12  Patient Global Assessment of Disease Activity (0-10): 8  Evaluator Global Assessment of Disease (0-10): 7  CDAI Score: 30  CDAI interpretation:  0.0-2.8 Remission   2.9-10.0 Low disease activity   10.1-22.0 Moderate disease activity   22.1-76.0 High disease activity

## 2022-01-13 NOTE — Unmapped (Signed)
[facility] Specialty Medication Onboarding    Specialty Medication: SIMPONI 50 mg/0.5 mL Pnij pen injector (golimumab)  Prior Authorization: Not Required   Financial Assistance: No - copay  <$25  Final Copay/Day Supply: $10.35 / 28    Insurance Restrictions: Yes - max 1 month supply     Notes to Pharmacist: n/a    The triage team has completed the benefits investigation and has determined that the patient is able to fill this medication at [facility]. Please contact the patient to complete the onboarding or follow up with the prescribing physician as needed.

## 2022-01-13 NOTE — Unmapped (Signed)
[facility]   Patient Onboarding/Medication Counseling    Patient has been on Simponi before but restarting therapy now    Paul Hurst is a 56 y.o. male with RA who I am counseling today on continuation of therapy.  I am speaking to the patient.    Was a Nurse, learning disability used for this call? No    Verified patient's date of birth / HIPAA.    Specialty medication(s) to be sent: Inflammatory Disorders: Simponi      Non-specialty medications/supplies to be sent: sharps container      Medications not needed at this time: n/a         Simponi (golimumab)    The patient declined counseling on medication administration, missed dose instructions, goals of therapy, side effects and monitoring parameters, warnings and precautions, drug/food interactions, and storage, handling precautions, and disposal because they have taken the medication previously. The information in the declined sections below are for informational purposes only and was not discussed with patient.       Medication & Administration     Dosage: Rheumatoid arthritis: Inject 50mg  under the skin one time monthly ( every 28 days)       Lab tests required prior to treatment initiation:  Tested by [name] -his last rheumatologist and were negative. They're in her note in media tab   Tuberculosis: Tuberculosis screening resulted in a non-reactive Quantiferon TB Gold assay.  Hepatitis B: Hepatitis B serology studies are complete and non-reactive.      Administration:     Best boy?? Environmental health practitioner all supplies needed for injection on a clean, flat working surface: medication pen removed from packaging, alcohol swab, sharps container, etc.  Look at the medication label - look for correct medication, correct dose, and check the expiration date  Look at the medication - the liquid visible in the window on the side of the pen device should appear clear and colorless to slightly yellow and may contain tiny white or clear particles  Lay the auto-injector pen on a flat surface and allow it to warm up to room temperature for at least 30 minutes  Select injection site - you can use the front of your thigh or your belly (but not the area 2 inches around your belly button); if someone else is giving you the injection you can also use your upper arm in the skin covering your triceps muscle  Prepare injection site - wash your hands and clean the skin at the injection site with an alcohol swab and let it air dry, do not touch the injection site again before the injection  Twist off the safety cap to break the security seal and then pull it straight off, do not remove until immediately prior to injection and do not touch the green needle cover  Put the green needle cover against your skin at the injection site at a 90 degree angle, hold the pen such that you can see the clear medication window  Push the pen firmly against the injection side so that the green safety cover slides into the clear cover - take care not to touch the activation button yet  While maintaining downward pressure, press the raised part of the activation button to initiate the injection process; you will hear a click to indicate the injection has started  Continue to hold the pen firmly against your skin for about 5-15 seconds - the window will start to turn yellow  There will be  a second click sound when the injection is complete, verify the yellow indicator has stopped moving in the viewing window to confirm the injection is fully complete and then pull the pen away from your skin  Dispose of the used auto-injector pen immediately in your sharps disposal container the needle will be covered automatically  If you see any blood at the injection site, press a cotton ball or gauze on the site and maintain pressure until the bleeding stops, do not rub the injection site      Adherence/Missed dose instructions:  If your injection is given more than 4 days after your scheduled injection date - consult your pharmacist for additional instructions on how to adjust your dosing schedule.      Goals of Therapy     Rheumatoid arthritis  Achieve symptom remission  Slow disease progression  Protection of remaining articular structures  Maintenance of function  Maintenance of effective psychosocial functioning      Side Effects & Monitoring Parameters     Injection site reaction (redness, irritation, inflammation localized to the site of administration)  Skin rash    The following side effects should be reported to the provider:  Signs of a hypersensitivity reaction - rash; hives; itching; red, swollen, blistered, or peeling skin; wheezing; tightness in the chest or throat; difficulty breathing, swallowing, or talking; swelling of the mouth, face, lips, tongue, or throat; etc.  Reduced immune function - report signs of infection such as fever; chills; body aches; very bad sore throat; ear or sinus pain; cough; more sputum or change in color of sputum; pain with passing urine; wound that will not heal, etc.  Also at a slightly higher risk of some malignancies (mainly skin and blood cancers) due to this reduced immune function.  In the case of signs of infection - the patient should hold the next dose of Simponi?? and call your primary care provider to ensure adequate medical care.  Treatment may be resumed when infection is treated and patient is asymptomatic.  Signs of lupus-like reaction - rash on cheeks, skin photosensitivity, muscle/joint pain, swelling in extremities, etc.  Signs of unexplained bruising or bleeding - throwing up blood or emesis that looks like coffee grounds; black, tarry, or bloody stool; etc.  Changes in skin - a new growth or lump that forms; changes in shape, size, or color of a previous mole or marking      Contraindications, Warnings, & Precautions     Have your bloodwork checked as you have been told by your prescriber  Talk with your doctor if you are pregnant, planning to become pregnant, or breastfeeding  Discuss the possible need for holding your dose(s) of Simponi?? when a planned procedure is scheduled with the prescriber as it may delay healing/recovery timeline       Drug/Food Interactions     Medication list reviewed in Epic. The patient was instructed to inform the care team before taking any new medications or supplements. No drug interactions identified.   If you have a latex allergy use caution when handling, the needle cap of the Simponi?? prefilled syringe and the safety cap for the Simponi SmartJect?? pen contains a derivative of natural rubber latex  Talk with you prescriber or pharmacist before receiving any live vaccinations while taking this medication and after you stop taking it    Storage, Handling Precautions, & Disposal     Store this medication in the refrigerator.  Do not freeze  If needed, you may  store at room temperature for up to 30 days  Store in original packaging, protected from light  Do not shake  Dispose of used syringes/pens in a sharps disposal container                Current Medications (including OTC/herbals), Comorbidities and Allergies     Current Outpatient Medications   Medication Sig Dispense Refill    acetaminophen (TYLENOL) 500 MG tablet Take 1 tablet (500 mg total) by mouth every six (6) hours as needed for pain.      amitriptyline (ELAVIL) 25 MG tablet Take 1 tablet (25 mg total) by mouth nightly. 90 tablet 3    aspirin 81 MG chewable tablet Chew 1 tablet (81 mg total) daily. 30 tablet 3    atorvastatin (LIPITOR) 80 MG tablet Take 1 tablet (80 mg total) by mouth every evening. 180 tablet 0    carvediloL (COREG) 6.25 MG tablet Take 1 tablet (6.25 mg total) by mouth two (2) times a day.      cholecalciferol, vitamin D3-10 mcg, 400 unit,, 10 mcg (400 unit) Tab Take 5 tablets (50 mcg total) by mouth daily.      clopidogreL (PLAVIX) 75 mg tablet Take 1 tablet (75 mg total) by mouth daily.      cyanocobalamin/folic acid (VITAMIN B12-FOLIC ACID) 1,000-400 mcg Lozg Take 1,000 mcg by mouth daily with evening meal.      cyclobenzaprine (FLEXERIL) 10 MG tablet Take 1 tablet (10 mg total) by mouth Three (3) times a day as needed. 90 tablet 1    ferrous sulfate 325 (65 FE) MG tablet Take 1 tablet (325 mg total) by mouth every other day. 15 tablet 11    folic acid (FOLVITE) 1 MG tablet Take 1 tablet (1 mg total) by mouth daily. (Patient taking differently: Take 800 mcg by mouth daily.) 30 tablet 6    gabapentin (NEURONTIN) 300 MG capsule Take 1 capsule (300 mg total) by mouth Three (3) times a day. 90 capsule 3    golimumab (SIMPONI) 50 mg/0.5 mL PnIj pen injector Inject the contents of 1 pen (50 mg total) under the skin every twenty-eight (28) days. 3 mL 3    ibuprofen (ADVIL) 200 MG tablet Take 1 tablet (200 mg total) by mouth every six (6) hours as needed for pain.      melatonin 10 mg Chew Chew 10 mg nightly.      metFORMIN (GLUCOPHAGE) 500 MG tablet Take 1 tablet (500 mg total) by mouth in the morning and 1 tablet (500 mg total) in the evening. Take with meals. 180 tablet 1    methotrexate, Preservative Free, 25 mg/mL Soln vial/syringe INJECT 1 ML (25 MG TOTAL) UNDER THE SKIN ONCE A WEEK. 6 mL 3    nitroglycerin (NITROSTAT) 0.4 MG SL tablet Place 1 tablet (0.4 mg total) under the tongue every five (5) minutes as needed for chest pain. Maximum of 3 doses in 15 minutes. 25 tablet 0    ondansetron (ZOFRAN) 4 MG tablet Take 1 tablet (4 mg total) by mouth every twelve (12) hours as needed for nausea.      syringe with needle 1 mL 25 gauge x 5/8 Syrg For use with injectable methotrexate 50 each 0    traMADoL (ULTRAM) 50 mg tablet Take 1 tablet (50 mg total) by mouth every twelve (12) hours as needed for pain. 60 tablet 3     No current facility-administered medications for this visit.  Allergies   Allergen Reactions    Seroquel [Quetiapine] Rash       Patient Active Problem List   Diagnosis    Rheumatoid arthritis (CMS-HCC)    Alcohol use disorder, moderate    Depression with anxiety    Intraparenchymal hemorrhage of brain (CMS-HCC)    At risk for falls    Impaired mobility and ADLs    History of stroke    Hypertension    Tachycardia    Diabetes mellitus without complication (CMS-HCC)    Hyponatremia    Restless leg syndrome    Iron deficiency anemia    History of substance use    Abnormal LFTs    Long-term use of immunosuppressant medication    Primary osteoarthritis involving multiple joints    Laceration of hand    Moderate episode of recurrent major depressive disorder (CMS-HCC)       Reviewed and up to date in Epic.    Appropriateness of Therapy     Acute infections noted within Epic:  No active infections  Patient reported infection: None    Is medication and dose appropriate based on diagnosis and infection status? Yes    Prescription has been clinically reviewed: Yes      Baseline Quality of Life Assessment      How many days over the past month did your RA  keep you from your normal activities? For example, brushing your teeth or getting up in the morning. Every day    Financial Information     Medication Assistance provided: Prior Authorization    Anticipated copay of $10.35/28 days reviewed with patient. Verified delivery address.    Delivery Information     Scheduled delivery date: 12/13    Expected start date: 12/13    Medication will be delivered via Same Day Courier to the prescription address in [facility].  This shipment will not require a signature.      Explained the services we provide at [facility] and that each month we would call to set up refills.  Stressed importance of returning phone calls so that we could ensure they receive their medications in time each month.  Informed patient that we should be setting up refills 7-10 days prior to when they will run out of medication.  A pharmacist will reach out to perform a clinical assessment periodically.  Informed patient that a welcome packet, containing information about our pharmacy and other support services, a Notice of Privacy Practices, and a drug information handout will be sent.      The patient or caregiver noted above participated in the development of this care plan and knows that they can request review of or adjustments to the care plan at any time.      Patient or caregiver verbalized understanding of the above information as well as how to contact the pharmacy at [phone] option 4 with any questions/concerns.  The pharmacy is open Monday through Friday 8:30am-4:30pm.  A pharmacist is available 24/7 via pager to answer any clinical questions they may have.    Patient Specific Needs     Does the patient have any physical, cognitive, or cultural barriers? No    Does the patient have adequate living arrangements? (i.e. the ability to store and take their medication appropriately) Yes    Did you identify any home environmental safety or security hazards? No    Patient prefers to have medications discussed with  Patient     Is  the patient or caregiver able to read and understand education materials at a high school level or above? Yes    Patient's primary language is  English     Is the patient high risk? No    SOCIAL DETERMINANTS OF HEALTH     At the [facility], we have learned that life circumstances - like trouble affording food, housing, utilities, or transportation can affect the health of many of our patients.   That is why we wanted to ask: are you currently experiencing any life circumstances that are negatively impacting your health and/or quality of life? No    Social Determinants of Health     Financial Resource Strain: Low Risk  (06/19/2021)    Overall Financial Resource Strain (CARDIA)     Difficulty of Paying Living Expenses: Not very hard   Internet Connectivity: No Internet connectivity concern identified (12/12/2021)    Internet Connectivity     Do you have access to internet services: Yes     How do you connect to the internet: Personal Device at home     Is your internet connection strong enough for you to watch video on your device without major problems?: Yes     Do you have enough data to get through the month?: Yes     Does at least one of the devices have a camera that you can use for video chat?: Yes   Food Insecurity: No Food Insecurity (06/19/2021)    Hunger Vital Sign     Worried About Running Out of Food in the Last Year: Never true     Ran Out of Food in the Last Year: Never true   Tobacco Use: Medium Risk (01/12/2022)    Patient History     Smoking Tobacco Use: Former     Smokeless Tobacco Use: Never     Passive Exposure: Not on file   Housing/Utilities: Low Risk  (06/19/2021)    Housing/Utilities     Within the past 12 months, have you ever stayed: outside, in a car, in a tent, in an overnight shelter, or temporarily in someone else's home (i.e. couch-surfing)?: No     Are you worried about losing your housing?: No     Within the past 12 months, have you been unable to get utilities (heat, electricity) when it was really needed?: No   Alcohol Use: Not At Risk (12/12/2021)    Alcohol Use     How often do you have a drink containing alcohol?: Monthly or less     How many drinks containing alcohol do you have on a typical day when you are drinking?: 1 - 2     How often do you have 5 or more drinks on one occasion?: Never   Transportation Needs: No Transportation Needs (07/01/2021)    PRAPARE - Transportation     Lack of Transportation (Medical): No     Lack of Transportation (Non-Medical): No   Substance Use: Low Risk  (12/12/2021)    Substance Use     Taken prescription drugs for non-medical reasons: Never     Taken illegal drugs: Never     Patient indicated they have taken drugs in the past year for non-medical reasons: Yes, [positive answer(s)]: Not on file   Health Literacy: Low Risk  (07/01/2021)    Health Literacy     : Never   Physical Activity: Not on file   Interpersonal Safety: Not at risk (12/12/2021)    Interpersonal Safety  Unsafe Where You Currently Live: No     Physically Hurt by Anyone: No     Abused by Anyone: No   Stress: No Stress Concern Present (12/12/2021)    [employer] of Occupational Health - Occupational Stress Questionnaire     Feeling of Stress : Only a little   Intimate Partner Violence: Not At Risk (12/12/2021)    Humiliation, Afraid, Rape, and Kick questionnaire     Fear of Current or Ex-Partner: No     Emotionally Abused: No     Physically Abused: No     Sexually Abused: No   Depression: Not at risk (12/12/2021)    PHQ-2     PHQ-2 Score: 2   Social Connections: Not on file       Would you be willing to receive help with any of the needs that you have identified today? Not applicable       Baker Janus  [facility]   [phone] opt 4    [facility] Specialty Pharmacist

## 2022-01-18 DIAGNOSIS — F4312 Post-traumatic stress disorder, chronic: Secondary | ICD-10-CM | POA: Diagnosis not present

## 2022-01-18 DIAGNOSIS — F54 Psychological and behavioral factors associated with disorders or diseases classified elsewhere: Secondary | ICD-10-CM | POA: Diagnosis not present

## 2022-01-23 ENCOUNTER — Other Ambulatory Visit: Payer: Self-pay | Admitting: Family Medicine

## 2022-01-24 MED ORDER — EMPTY CONTAINER
2 refills | 0 days
Start: 2022-01-24 — End: ?

## 2022-01-25 DIAGNOSIS — F4312 Post-traumatic stress disorder, chronic: Secondary | ICD-10-CM | POA: Diagnosis not present

## 2022-01-25 DIAGNOSIS — F54 Psychological and behavioral factors associated with disorders or diseases classified elsewhere: Secondary | ICD-10-CM | POA: Diagnosis not present

## 2022-01-25 MED FILL — EMPTY CONTAINER: 90 days supply | Qty: 1 | Fill #0

## 2022-02-08 MED ORDER — GABAPENTIN 300 MG CAPSULE
ORAL_CAPSULE | Freq: Three times a day (TID) | ORAL | 3 refills | 0 days
Start: 2022-02-08 — End: ?

## 2022-02-09 MED ORDER — GABAPENTIN 300 MG CAPSULE
ORAL_CAPSULE | Freq: Three times a day (TID) | ORAL | 3 refills | 30 days | Status: CP
Start: 2022-02-09 — End: ?

## 2022-02-15 DIAGNOSIS — F54 Psychological and behavioral factors associated with disorders or diseases classified elsewhere: Secondary | ICD-10-CM | POA: Diagnosis not present

## 2022-02-15 DIAGNOSIS — F4312 Post-traumatic stress disorder, chronic: Secondary | ICD-10-CM | POA: Diagnosis not present

## 2022-02-15 NOTE — Unmapped (Signed)
[facility] Clinical Assessment & Refill Coordination Note    Paul Dubicki Sr., DOB: 11/09/1965  Phone: [phone] (home)     All above HIPAA information was verified with patient.     Was a Nurse, learning disability used for this call? No    Specialty Medication(s):   Inflammatory Disorders: Simponi     Current Outpatient Medications   Medication Sig Dispense Refill    acetaminophen (TYLENOL) 500 MG tablet Take 1 tablet (500 mg total) by mouth every six (6) hours as needed for pain.      amitriptyline (ELAVIL) 25 MG tablet Take 1 tablet (25 mg total) by mouth nightly. 90 tablet 3    aspirin 81 MG chewable tablet Chew 1 tablet (81 mg total) daily. 30 tablet 3    atorvastatin (LIPITOR) 80 MG tablet Take 1 tablet (80 mg total) by mouth every evening. 180 tablet 0    carvediloL (COREG) 6.25 MG tablet Take 1 tablet (6.25 mg total) by mouth two (2) times a day.      cholecalciferol, vitamin D3-10 mcg, 400 unit,, 10 mcg (400 unit) Tab Take 5 tablets (50 mcg total) by mouth daily.      clopidogreL (PLAVIX) 75 mg tablet Take 1 tablet (75 mg total) by mouth daily.      cyanocobalamin/folic acid (VITAMIN B12-FOLIC ACID) 1,000-400 mcg Lozg Take 1,000 mcg by mouth daily with evening meal.      cyclobenzaprine (FLEXERIL) 10 MG tablet Take 1 tablet (10 mg total) by mouth Three (3) times a day as needed. 90 tablet 1    empty container Misc USE AS DIRECTED 1 each 2    ferrous sulfate 325 (65 FE) MG tablet Take 1 tablet (325 mg total) by mouth every other day. 15 tablet 11    folic acid (FOLVITE) 1 MG tablet Take 1 tablet (1 mg total) by mouth daily. (Patient taking differently: Take 800 mcg by mouth daily.) 30 tablet 6    gabapentin (NEURONTIN) 300 MG capsule TAKE 1 CAPSULE BY MOUTH THREE TIMES A DAY. 90 capsule 3    golimumab (SIMPONI) 50 mg/0.5 mL PnIj pen injector Inject the contents of 1 pen (50 mg total) under the skin every twenty-eight (28) days. 3 mL 3    ibuprofen (ADVIL) 200 MG tablet Take 1 tablet (200 mg total) by mouth every six (6) hours as needed for pain.      melatonin 10 mg Chew Chew 10 mg nightly.      metFORMIN (GLUCOPHAGE) 500 MG tablet Take 1 tablet (500 mg total) by mouth in the morning and 1 tablet (500 mg total) in the evening. Take with meals. 180 tablet 1    methotrexate, Preservative Free, 25 mg/mL Soln vial/syringe INJECT 1 ML (25 MG TOTAL) UNDER THE SKIN ONCE A WEEK. 6 mL 3    nitroglycerin (NITROSTAT) 0.4 MG SL tablet Place 1 tablet (0.4 mg total) under the tongue every five (5) minutes as needed for chest pain. Maximum of 3 doses in 15 minutes. 25 tablet 0    ondansetron (ZOFRAN) 4 MG tablet Take 1 tablet (4 mg total) by mouth every twelve (12) hours as needed for nausea.      syringe with needle 1 mL 25 gauge x 5/8 Syrg For use with injectable methotrexate 50 each 0    traMADoL (ULTRAM) 50 mg tablet Take 1 tablet (50 mg total) by mouth every twelve (12) hours as needed for pain. 60 tablet 3  No current facility-administered medications for this visit.        Changes to medications: Khoa reports no changes at this time.    Allergies   Allergen Reactions    Seroquel [Quetiapine] Rash       Changes to allergies: No    SPECIALTY MEDICATION ADHERENCE         Medication Adherence    Patient reported X missed doses in the last month: 0  Specialty Medication: Simponi  Patient is on additional specialty medications: No  Informant: patient                            Specialty medication(s) dose(s) confirmed: Regimen is correct and unchanged.     Are there any concerns with adherence? No    Adherence counseling provided? Not needed    CLINICAL MANAGEMENT AND INTERVENTION      Clinical Benefit Assessment:    Do you feel the medicine is effective or helping your condition?  Patient is not sure    Clinical Benefit counseling provided? Progress note from 01/12/22 shows evidence of clinical benefit    Adverse Effects Assessment:    Are you experiencing any side effects? No    Are you experiencing difficulty administering your medicine? No    Quality of Life Assessment:    Quality of Life    Rheumatology  Oncology  Dermatology  Cystic Fibrosis          How many days over the past month did your RA  keep you from your normal activities? For example, brushing your teeth or getting up in the morning. Unable to say, he has been in pain for a while but does help to slow down the progressin    Have you discussed this with your provider? Not needed    Acute Infection Status:    Acute infections noted within Epic:  No active infections  Patient reported infection: None    Therapy Appropriateness:    Is therapy appropriate and patient progressing towards therapeutic goals? Yes, therapy is appropriate and should be continued    DISEASE/MEDICATION-SPECIFIC INFORMATION      For patients on injectable medications: Patient currently has 0 doses left.  Next injection is scheduled for 02/22/22.    Chronic Inflammatory Diseases: Have you experienced any flares in the last month? Has a lot of other medication conditions    PATIENT SPECIFIC NEEDS     Does the patient have any physical, cognitive, or cultural barriers? No    Is the patient high risk? No    Did the patient require a clinical intervention? No    Does the patient require physician intervention or other additional services (i.e., nutrition, smoking cessation, social work)? No    SOCIAL DETERMINANTS OF HEALTH     At the [facility], we have learned that life circumstances - like trouble affording food, housing, utilities, or transportation can affect the health of many of our patients.   That is why we wanted to ask: are you currently experiencing any life circumstances that are negatively impacting your health and/or quality of life? No    Social Determinants of Health     Financial Resource Strain: Low Risk  (06/19/2021)    Overall Financial Resource Strain (CARDIA)     Difficulty of Paying Living Expenses: Not very hard   Internet Connectivity: No Internet connectivity concern identified (12/12/2021)    Internet Connectivity     Do  you have access to internet services: Yes     How do you connect to the internet: Personal Device at home     Is your internet connection strong enough for you to watch video on your device without major problems?: Yes     Do you have enough data to get through the month?: Yes     Does at least one of the devices have a camera that you can use for video chat?: Yes   Food Insecurity: No Food Insecurity (06/19/2021)    Hunger Vital Sign     Worried About Running Out of Food in the Last Year: Never true     Ran Out of Food in the Last Year: Never true   Tobacco Use: Medium Risk (01/12/2022)    Patient History     Smoking Tobacco Use: Former     Smokeless Tobacco Use: Never     Passive Exposure: Not on file   Housing/Utilities: Low Risk  (06/19/2021)    Housing/Utilities     Within the past 12 months, have you ever stayed: outside, in a car, in a tent, in an overnight shelter, or temporarily in someone else's home (i.e. couch-surfing)?: No     Are you worried about losing your housing?: No     Within the past 12 months, have you been unable to get utilities (heat, electricity) when it was really needed?: No   Alcohol Use: Not At Risk (12/12/2021)    Alcohol Use     How often do you have a drink containing alcohol?: Monthly or less     How many drinks containing alcohol do you have on a typical day when you are drinking?: 1 - 2     How often do you have 5 or more drinks on one occasion?: Never   Transportation Needs: No Transportation Needs (07/01/2021)    PRAPARE - Transportation     Lack of Transportation (Medical): No     Lack of Transportation (Non-Medical): No   Substance Use: Low Risk  (12/12/2021)    Substance Use     Taken prescription drugs for non-medical reasons: Never     Taken illegal drugs: Never     Patient indicated they have taken drugs in the past year for non-medical reasons: Yes, [positive answer(s)]: Not on file   Health Literacy: Low Risk  (07/01/2021)    Health Literacy     : Never   Physical Activity: Not on file   Interpersonal Safety: Not at risk (12/12/2021)    Interpersonal Safety     Unsafe Where You Currently Live: No     Physically Hurt by Anyone: No     Abused by Anyone: No   Stress: No Stress Concern Present (12/12/2021)    [employer] of Occupational Health - Occupational Stress Questionnaire     Feeling of Stress : Only a little   Intimate Partner Violence: Not At Risk (12/12/2021)    Humiliation, Afraid, Rape, and Kick questionnaire     Fear of Current or Ex-Partner: No     Emotionally Abused: No     Physically Abused: No     Sexually Abused: No   Depression: Not at risk (12/12/2021)    PHQ-2     PHQ-2 Score: 2   Social Connections: Not on file       Would you be willing to receive help with any of the needs that you have identified today? Not applicable  SHIPPING     Specialty Medication(s) to be Shipped:   Inflammatory Disorders: Simponi    Other medication(s) to be shipped: No additional medications requested for fill at this time     Changes to insurance: No    Delivery Scheduled: Yes, Expected medication delivery date: 1/8.     Medication will be delivered via Same Day Courier to the confirmed prescription address in [facility].    The patient will receive a drug information handout for each medication shipped and additional FDA Medication Guides as required.  Verified that patient has previously received a Conservation officer, historic buildings and a Surveyor, mining.    The patient or caregiver noted above participated in the development of this care plan and knows that they can request review of or adjustments to the care plan at any time.      All of the patient's questions and concerns have been addressed.    Julianne Rice, PharmD   [facility] Specialty Pharmacist

## 2022-02-20 MED FILL — SIMPONI 50 MG/0.5 ML SUBCUTANEOUS PEN INJECTOR: SUBCUTANEOUS | 84 days supply | Qty: 1.5 | Fill #1

## 2022-02-22 DIAGNOSIS — F54 Psychological and behavioral factors associated with disorders or diseases classified elsewhere: Secondary | ICD-10-CM | POA: Diagnosis not present

## 2022-02-22 DIAGNOSIS — F4312 Post-traumatic stress disorder, chronic: Secondary | ICD-10-CM | POA: Diagnosis not present

## 2022-02-23 LAB — COMPREHENSIVE METABOLIC PANEL
ALBUMIN: 4.4 g/dL (ref 3.4–5.0)
ALKALINE PHOSPHATASE: 151 U/L — ABNORMAL HIGH (ref 46–116)
ALT (SGPT): 20 U/L (ref 10–49)
ANION GAP: 9 mmol/L (ref 5–14)
AST (SGOT): 21 U/L (ref <=34)
BILIRUBIN TOTAL: 0.3 mg/dL (ref 0.3–1.2)
BLOOD UREA NITROGEN: <5 mg/dL — ABNORMAL LOW (ref 9–23)
CALCIUM: 10 mg/dL (ref 8.7–10.4)
CHLORIDE: 105 mmol/L (ref 98–107)
CO2: 27.2 mmol/L (ref 20.0–31.0)
CREATININE: 0.68 mg/dL — ABNORMAL LOW
EGFR CKD-EPI (2021) MALE: >90 mL/min/{1.73_m2} (ref >=60)
GLUCOSE RANDOM: 126 mg/dL (ref 70–179)
POTASSIUM: 4.1 mmol/L (ref 3.4–4.8)
PROTEIN TOTAL: 8.8 g/dL — ABNORMAL HIGH (ref 5.7–8.2)
SODIUM: 141 mmol/L (ref 135–145)

## 2022-02-23 LAB — CBC W/ AUTO DIFF
BASOPHILS ABSOLUTE COUNT: 0 10*9/L (ref 0.0–0.1)
BASOPHILS RELATIVE PERCENT: 0.3 %
EOSINOPHILS ABSOLUTE COUNT: 0.2 10*9/L (ref 0.0–0.5)
EOSINOPHILS RELATIVE PERCENT: 2 %
HEMATOCRIT: 52.4 % — ABNORMAL HIGH (ref 39.0–48.0)
HEMOGLOBIN: 17.7 g/dL — ABNORMAL HIGH (ref 12.9–16.5)
LYMPHOCYTES ABSOLUTE COUNT: 3.7 10*9/L — ABNORMAL HIGH (ref 1.1–3.6)
LYMPHOCYTES RELATIVE PERCENT: 46.1 %
MEAN CORPUSCULAR HEMOGLOBIN CONC: 33.9 g/dL (ref 32.0–36.0)
MEAN CORPUSCULAR HEMOGLOBIN: 28.7 pg (ref 25.9–32.4)
MEAN CORPUSCULAR VOLUME: 84.6 fL (ref 77.6–95.7)
MEAN PLATELET VOLUME: 7.9 fL (ref 6.8–10.7)
MONOCYTES ABSOLUTE COUNT: 0.3 10*9/L (ref 0.3–0.8)
MONOCYTES RELATIVE PERCENT: 4.4 %
NEUTROPHILS ABSOLUTE COUNT: 3.8 10*9/L (ref 1.8–7.8)
NEUTROPHILS RELATIVE PERCENT: 47.2 %
NUCLEATED RED BLOOD CELLS: 0 /100{WBCs} (ref <=4)
PLATELET COUNT: 260 10*9/L (ref 150–450)
RED BLOOD CELL COUNT: 6.19 10*12/L — ABNORMAL HIGH (ref 4.26–5.60)
RED CELL DISTRIBUTION WIDTH: 19.8 % — ABNORMAL HIGH (ref 12.2–15.2)
WBC ADJUSTED: 8 10*9/L (ref 3.6–11.2)

## 2022-02-23 LAB — URINALYSIS WITH MICROSCOPY WITH CULTURE REFLEX
BACTERIA: NONE SEEN /HPF
BILIRUBIN UA: NEGATIVE
BLOOD UA: NEGATIVE
GLUCOSE UA: NEGATIVE
KETONES UA: NEGATIVE
LEUKOCYTE ESTERASE UA: NEGATIVE
NITRITE UA: NEGATIVE
PH UA: 5.5 (ref 5.0–9.0)
PROTEIN UA: NEGATIVE
RBC UA: <1 /HPF (ref <=3)
SPECIFIC GRAVITY UA: 1.003 (ref 1.003–1.030)
SQUAMOUS EPITHELIAL: <1 /HPF (ref 0–5)
UROBILINOGEN UA: <2
WBC UA: <1 /HPF (ref <=2)

## 2022-02-24 ENCOUNTER — Ambulatory Visit
Admit: 2022-02-24 | Discharge: 2022-02-24 | Disposition: A | Discharge status: Home / Self Care | Payer: MEDICARE | Attending: Emergency Medicine

## 2022-02-24 NOTE — Unmapped (Addendum)
Patient via WC to triage. Caretaker with patient and reports around 7pm he started developing memory issues. Hx stroke in April 2023 with left sided deficits. Strong grip to right side. Patient aware of surroundings.   Reports he has been drinking today and used delta8 & delta 10. Does report he was having hallucinations 3 hours and seeing aliens, but no hallucinations right now. Crying in triage.         BG 121 in triage.

## 2022-02-24 NOTE — Unmapped (Addendum)
[facility]  Emergency Department Provider Note     ED Clinical Impression     Final diagnoses:   Alcoholic intoxication without complication (CMS-HCC) (Primary)   History of stroke   Disorientation      Impression, Medical Decision Making, ED Course     Impression: 57 y.o. male with PMH most significant for HTN, RA, DM, CVA w/ left-sided deficits (05/2021), PTSD, and alcohol use disorder who presents with disorientation as described below.    Initial vitals notable for some tachycardia of 114 but otherwise reassuring.  Upon initial evaluation, patient is well-appearing and in no acute distress.  He is alert and oriented.  Does not appear to be responding to internal stimuli.  On exam, left upper extremity weakness and left sided facial droop consistent with baseline.    Initial differential includes but is not limited to alcohol intoxication, delta 8/THC intoxication, amongst multiple etiologies.  Basic labs are obtained in triage including CBC, CMP, and UA.  CBC with normal WBC, hemoglobin 17.7, platelets normal.  CMP with reassuring electrolytes, renal function, bilirubin and LFTs.  UA without any evidence of UTI.EKG also obtained in triage and independently reviewed myself and shows sinus tachycardia with rate of 113 bpm.  QTc of 441.  No significant ST elevations or depressions.  No obvious signs of acute ischemia arrhythmia at this time.  Anticipate patient's symptoms are likely due to to his intoxication with alcohol and delta 8.  He reports that he was watching American dad on TV when he became a part of it.  Denies any SI or HI.  Appears to be back at his baseline per caretaker.  Discussed with caretaker patient plan to discharge and will give neurology follow-up for his history of stroke.  Recommended close PCP follow-up.  They understand and are agreeable to plan.       ____________________________________________    The case was discussed with the attending physician, who is in agreement with the above assessment and plan.      History     Chief Complaint  Chief Complaint   Patient presents with    Altered Mental Status       HPI   Paul Schwiebert Sr. is a 57 y.o. male with past medical history as below who presents with hallucinations and disorientation. Per caregiver, the patient believed he was in a different city today. He then hallucinated he was being abducted by aliens after watching a program discussing aliens on TV. He states he became a part of it. Caregiver reports a history of similar disorientation following his stroke. He does not follow with neurology. Endorses daily EtOH use of 1 pint of vodka, though no history of seizures. Last drink yesterday at 3 PM. He further endorses marijuana and THC use, though denies new sources or variants tried today. The patient denies SI or HI.    Outside Historian(s): I have obtained additional history/collateral from patient's caregiver.    External Records Reviewed: I have reviewed recent and relevant previous record, including: Inpatient notes - 06/17/21 Discharge Summary and Outpatient notes - 01/12/22 Rheumatology note for past medical history    Past Medical History:   Diagnosis Date    Diabetes mellitus (CMS-HCC)     Rheumatoid arthritis(714.0)     Stroke (CMS-HCC)        No past surgical history on file.    No current facility-administered medications for this encounter.    Current Outpatient Medications:     acetaminophen (TYLENOL) 500  MG tablet, Take 1 tablet (500 mg total) by mouth every six (6) hours as needed for pain., Disp: , Rfl:     amitriptyline (ELAVIL) 25 MG tablet, Take 1 tablet (25 mg total) by mouth nightly., Disp: 90 tablet, Rfl: 3    aspirin 81 MG chewable tablet, Chew 1 tablet (81 mg total) daily., Disp: 30 tablet, Rfl: 3    atorvastatin (LIPITOR) 80 MG tablet, Take 1 tablet (80 mg total) by mouth every evening., Disp: 180 tablet, Rfl: 0    carvediloL (COREG) 6.25 MG tablet, Take 1 tablet (6.25 mg total) by mouth two (2) times a day., Disp: , Rfl:     cholecalciferol, vitamin D3-10 mcg, 400 unit,, 10 mcg (400 unit) Tab, Take 5 tablets (50 mcg total) by mouth daily., Disp: , Rfl:     clopidogreL (PLAVIX) 75 mg tablet, Take 1 tablet (75 mg total) by mouth daily., Disp: , Rfl:     cyanocobalamin/folic acid (VITAMIN B12-FOLIC ACID) 1,000-400 mcg Lozg, Take 1,000 mcg by mouth daily with evening meal., Disp: , Rfl:     cyclobenzaprine (FLEXERIL) 10 MG tablet, Take 1 tablet (10 mg total) by mouth Three (3) times a day as needed., Disp: 90 tablet, Rfl: 1    empty container Misc, USE AS DIRECTED, Disp: 1 each, Rfl: 2    ferrous sulfate 325 (65 FE) MG tablet, Take 1 tablet (325 mg total) by mouth every other day., Disp: 15 tablet, Rfl: 11    folic acid (FOLVITE) 1 MG tablet, Take 1 tablet (1 mg total) by mouth daily. (Patient taking differently: Take 800 mcg by mouth daily.), Disp: 30 tablet, Rfl: 6    gabapentin (NEURONTIN) 300 MG capsule, TAKE 1 CAPSULE BY MOUTH THREE TIMES A DAY., Disp: 90 capsule, Rfl: 3    golimumab (SIMPONI) 50 mg/0.5 mL PnIj pen injector, Inject the contents of 1 pen (50 mg total) under the skin every twenty-eight (28) days., Disp: 3 mL, Rfl: 3    ibuprofen (ADVIL) 200 MG tablet, Take 1 tablet (200 mg total) by mouth every six (6) hours as needed for pain., Disp: , Rfl:     melatonin 10 mg Chew, Chew 10 mg nightly., Disp: , Rfl:     metFORMIN (GLUCOPHAGE) 500 MG tablet, Take 1 tablet (500 mg total) by mouth in the morning and 1 tablet (500 mg total) in the evening. Take with meals., Disp: 180 tablet, Rfl: 1    methotrexate, Preservative Free, 25 mg/mL Soln vial/syringe, INJECT 1 ML (25 MG TOTAL) UNDER THE SKIN ONCE A WEEK., Disp: 6 mL, Rfl: 3    nitroglycerin (NITROSTAT) 0.4 MG SL tablet, Place 1 tablet (0.4 mg total) under the tongue every five (5) minutes as needed for chest pain. Maximum of 3 doses in 15 minutes., Disp: 25 tablet, Rfl: 0    ondansetron (ZOFRAN) 4 MG tablet, Take 1 tablet (4 mg total) by mouth every twelve (12) hours as needed for nausea., Disp: , Rfl:     syringe with needle 1 mL 25 gauge x 5/8 Syrg, For use with injectable methotrexate, Disp: 50 each, Rfl: 0    traMADoL (ULTRAM) 50 mg tablet, Take 1 tablet (50 mg total) by mouth every twelve (12) hours as needed for pain., Disp: 60 tablet, Rfl: 3    Allergies  Seroquel [quetiapine]    Family History  No family history on file.    Social History  Social History     Tobacco Use    Smoking status:  Former     Current packs/day: 0.00     Types: Cigarettes     Quit date: 04/29/2020     Years since quitting: 1.8    Smokeless tobacco: Never        Physical Exam     VITAL SIGNS:      Vitals:    02/23/22 2039 02/24/22 0015   BP: 130/85 (!) 129/99   Pulse: 114 104   Resp: 18 18   Temp: 36.9 ??C (98.4 ??F) 36.9 ??C (98.4 ??F)   TempSrc: Oral Oral   SpO2: 100% 99%   Weight: 83 kg (183 lb)    Hurst: 170.2 cm (5' 7)      Constitutional: Alert and oriented. No acute distress.  Eyes: Conjunctivae are normal.  HEENT: Normocephalic and atraumatic. Conjunctivae clear. No congestion. Moist mucous membranes.   Cardiovascular: Rate as above, regular rhythm. Normal and symmetric distal pulses. Brisk capillary refill. Normal skin turgor.  Respiratory: Normal respiratory effort. Breath sounds are normal. There are no wheezing or crackles heard.  Gastrointestinal: Soft, non-distended, non-tender.  Genitourinary: Deferred.  Musculoskeletal: Non-tender with normal range of motion in all extremities.  Neurologic: Normal speech and language. No gross focal neurologic deficits beyond baseline are appreciated. Left upper extremity weakness and left sided facial droop consistent with baseline.  Skin: Skin is warm, dry and intact. No rash noted.  Psychiatric: Mood and affect are normal. Speech and behavior are normal.     Radiology     No orders to display       Pertinent labs & imaging results that were available during my care of the patient were independently interpreted by me and considered in my medical decision making (see chart for details).    Portions of this record have been created using Scientist, clinical (histocompatibility and immunogenetics). Dictation errors have been sought, but may not have been identified and corrected.    Documentation assistance was provided by Paul Hurst, Scribe on February 24, 2022 at 2:13 AM for Francis Dowse, MD.       Documentation assistance provided by the above mentioned scribe. I was present during the time the encounter was recorded. The information recorded by the scribe was done at my direction and has been reviewed and validated by me.               Francis Dowse, MD  Resident  02/24/22 [phone]

## 2022-02-28 MED ORDER — ONETOUCH ULTRA TEST STRIPS
ORAL_STRIP | Freq: Every day | 1 refills | 0 days | Status: CP
Start: 2022-02-28 — End: 2023-02-28

## 2022-02-28 NOTE — Unmapped (Signed)
Left message he is trying to get a hold of Dr. Theda Sers nurse.

## 2022-03-01 DIAGNOSIS — F54 Psychological and behavioral factors associated with disorders or diseases classified elsewhere: Secondary | ICD-10-CM | POA: Diagnosis not present

## 2022-03-01 DIAGNOSIS — F4312 Post-traumatic stress disorder, chronic: Secondary | ICD-10-CM | POA: Diagnosis not present

## 2022-03-15 DIAGNOSIS — F54 Psychological and behavioral factors associated with disorders or diseases classified elsewhere: Secondary | ICD-10-CM | POA: Diagnosis not present

## 2022-03-15 DIAGNOSIS — F4312 Post-traumatic stress disorder, chronic: Secondary | ICD-10-CM | POA: Diagnosis not present

## 2022-03-17 MED ORDER — CYCLOBENZAPRINE 10 MG TABLET
ORAL_TABLET | Freq: Three times a day (TID) | ORAL | 1 refills | 30 days | PRN
Start: 2022-03-17 — End: 2023-03-17

## 2022-03-17 NOTE — Unmapped (Signed)
Patient is requesting the following refill  Requested Prescriptions     Pending Prescriptions Disp Refills    cyclobenzaprine (FLEXERIL) 10 MG tablet [Pharmacy Med Name: CYCLOBENZAPRINE 10 MG TABLET] 90 tablet 1     Sig: Take 1 tablet (10 mg total) by mouth Three (3) times a day as needed.       Recent Visits  Date Type Provider Dept   01/09/22 Office Visit Mangel, Benison Pap, DO [place] [facility] [address] [facility]   12/12/21 Office Visit Mangel, Benison Pap, DO [place] [facility] [address] At [facility]   11/14/21 Office Visit Mangel, Benison Pap, DO [place] [facility] [address] At [facility]   Showing recent visits within past 365 days with a meds authorizing provider and meeting all other requirements  Future Appointments  Date Type Provider Dept   04/11/22 Appointment Mangel, Benison Pap, DO [place] [facility] [address] At [facility]   Showing future appointments within next 365 days with a meds authorizing provider and meeting all other requirements       Labs: Not applicable this refill

## 2022-03-19 MED ORDER — CYCLOBENZAPRINE 10 MG TABLET
ORAL_TABLET | Freq: Three times a day (TID) | ORAL | 1 refills | 30 days | PRN
Start: 2022-03-19 — End: 2023-03-19

## 2022-03-20 NOTE — Unmapped (Signed)
He is to be following with pain management

## 2022-03-22 DIAGNOSIS — F54 Psychological and behavioral factors associated with disorders or diseases classified elsewhere: Secondary | ICD-10-CM | POA: Diagnosis not present

## 2022-03-22 DIAGNOSIS — F4312 Post-traumatic stress disorder, chronic: Secondary | ICD-10-CM | POA: Diagnosis not present

## 2022-03-30 MED ORDER — CYCLOBENZAPRINE 10 MG TABLET
ORAL_TABLET | Freq: Three times a day (TID) | ORAL | 1 refills | 30 days | PRN
Start: 2022-03-30 — End: 2023-03-30

## 2022-03-30 NOTE — Unmapped (Signed)
Patient is requesting the following refill  Requested Prescriptions     Pending Prescriptions Disp Refills    cyclobenzaprine (FLEXERIL) 10 MG tablet [Pharmacy Med Name: CYCLOBENZAPRINE 10 MG TABLET] 90 tablet 1     Sig: Take 1 tablet (10 mg total) by mouth Three (3) times a day as needed.       Recent Visits  Date Type Provider Dept   01/09/22 Office Visit Mangel, Benison Pap, DO [place] [facility] [address] At [facility]   12/12/21 Office Visit Mangel, Benison Pap, DO [place] [facility] [address] At [facility]   11/14/21 Office Visit Mangel, Benison Pap, DO [place] [facility] [address] At [place] recent visits within past 365 days with a meds authorizing provider and meeting all other requirements  Future Appointments  Date Type Provider Dept   04/11/22 Appointment Mangel, Benison Pap, DO [place] [facility] [address] At [place] future appointments within next 365 days with a meds authorizing provider and meeting all other requirements       Labs: Not applicable this refill

## 2022-04-06 NOTE — Unmapped (Signed)
Patient requesting for onetouch ultra test strips needs diagnostic code on prescription in order for his insurance to cover prescription. Patient states he is out of test strips. Patient requesting refill on flexeril. States pain management in [place] is not taking new patients. Requesting for call back to discuss. Please call partner's number Quenten Raven at [phone] if [name] does not answer please call Reshawn at [phone]. Thank you.

## 2022-04-07 MED ORDER — CYCLOBENZAPRINE 10 MG TABLET
ORAL_TABLET | Freq: Three times a day (TID) | ORAL | 1 refills | 30 days | Status: CP | PRN
Start: 2022-04-07 — End: 2023-04-07

## 2022-04-07 NOTE — Unmapped (Signed)
Patient is requesting the following refill  Requested Prescriptions     Pending Prescriptions Disp Refills    cyclobenzaprine (FLEXERIL) 10 MG tablet [Pharmacy Med Name: CYCLOBENZAPRINE 10 MG TABLET] 90 tablet 1     Sig: Take 1 tablet (10 mg total) by mouth Three (3) times a day as needed.       Recent Visits  Date Type Provider Dept   01/09/22 Office Visit Mangel, Benison Pap, DO [place] [facility] [address] [facility]   12/12/21 Office Visit Mangel, Benison Pap, DO [place] [facility] [address] At [facility]   11/14/21 Office Visit Mangel, Benison Pap, DO [place] [facility] [address] At [facility]   Showing recent visits within past 365 days with a meds authorizing provider and meeting all other requirements  Future Appointments  Date Type Provider Dept   04/11/22 Appointment Mangel, Benison Pap, DO [place] [facility] [address] At [facility]   Showing future appointments within next 365 days with a meds authorizing provider and meeting all other requirements       Labs: Not applicable this refill

## 2022-04-11 ENCOUNTER — Ambulatory Visit
Admit: 2022-04-11 | Discharge: 2022-04-12 | Payer: MEDICARE | Attending: Student in an Organized Health Care Education/Training Program | Primary: Student in an Organized Health Care Education/Training Program

## 2022-04-11 DIAGNOSIS — M069 Rheumatoid arthritis, unspecified: Principal | ICD-10-CM

## 2022-04-11 DIAGNOSIS — I639 Cerebral infarction, unspecified: Principal | ICD-10-CM

## 2022-04-11 DIAGNOSIS — I10 Essential (primary) hypertension: Principal | ICD-10-CM

## 2022-04-11 DIAGNOSIS — E119 Type 2 diabetes mellitus without complications: Principal | ICD-10-CM

## 2022-04-11 DIAGNOSIS — I619 Nontraumatic intracerebral hemorrhage, unspecified: Principal | ICD-10-CM

## 2022-04-11 DIAGNOSIS — F1020 Alcohol dependence, uncomplicated: Principal | ICD-10-CM

## 2022-04-11 DIAGNOSIS — D751 Secondary polycythemia: Principal | ICD-10-CM

## 2022-04-11 DIAGNOSIS — Z789 Other specified health status: Principal | ICD-10-CM

## 2022-04-11 DIAGNOSIS — Z7409 Other reduced mobility: Principal | ICD-10-CM

## 2022-04-11 DIAGNOSIS — F4310 Post-traumatic stress disorder, unspecified: Principal | ICD-10-CM

## 2022-04-11 DIAGNOSIS — Z8673 Personal history of transient ischemic attack (TIA), and cerebral infarction without residual deficits: Principal | ICD-10-CM

## 2022-04-11 DIAGNOSIS — G8929 Other chronic pain: Principal | ICD-10-CM

## 2022-04-11 LAB — ALBUMIN / CREATININE URINE RATIO
ALBUMIN QUANT URINE: <0.3 mg/dL
CREATININE, URINE: 81.7 mg/dL

## 2022-04-11 LAB — CBC
HEMATOCRIT: 45.7 % (ref 39.0–48.0)
HEMOGLOBIN: 16 g/dL (ref 12.9–16.5)
MEAN CORPUSCULAR HEMOGLOBIN CONC: 35 g/dL (ref 32.0–36.0)
MEAN CORPUSCULAR HEMOGLOBIN: 30.4 pg (ref 25.9–32.4)
MEAN CORPUSCULAR VOLUME: 87 fL (ref 77.6–95.7)
MEAN PLATELET VOLUME: 8.6 fL (ref 6.8–10.7)
PLATELET COUNT: 248 10*9/L (ref 150–450)
RED BLOOD CELL COUNT: 5.25 10*12/L (ref 4.26–5.60)
RED CELL DISTRIBUTION WIDTH: 15 % (ref 12.2–15.2)
WBC ADJUSTED: 8.4 10*9/L (ref 3.6–11.2)

## 2022-04-11 LAB — HEMOGLOBIN A1C
ESTIMATED AVERAGE GLUCOSE: 114 mg/dL
HEMOGLOBIN A1C: 5.6 % (ref 4.8–5.6)

## 2022-04-11 MED ORDER — ONETOUCH ULTRA TEST STRIPS
ORAL_STRIP | Freq: Every day | 1 refills | 0 days | Status: CP
Start: 2022-04-11 — End: 2023-04-11

## 2022-04-11 MED ORDER — CLOPIDOGREL 75 MG TABLET
ORAL_TABLET | Freq: Every day | ORAL | 0 refills | 90 days | Status: CP
Start: 2022-04-11 — End: ?

## 2022-04-11 MED ORDER — CARVEDILOL 6.25 MG TABLET
ORAL_TABLET | Freq: Two times a day (BID) | ORAL | 0 refills | 90 days | Status: CP
Start: 2022-04-11 — End: ?

## 2022-04-11 NOTE — Unmapped (Signed)
Assessment and Plan:       Diagnoses and all orders for this visit:    Intraparenchymal hemorrhage of brain (CMS-HCC)    History of stroke  -     clopidogrel (PLAVIX) 75 mg tablet; Take 1 tablet (75 mg total) by mouth daily.  -     Ambulatory referral to Neurology; Future    Impaired mobility and ADLs    Alcohol use disorder, moderate    PTSD (post-traumatic stress disorder)    Other chronic pain  -     Ambulatory referral to Pain Clinic; Future    Diabetes mellitus without complication (CMS-HCC)  -     Microalbumin / creatinine urine ratio; Future  -     blood sugar diagnostic (ONETOUCH ULTRA TEST) Strp; by Other route daily before breakfast.  -     Hemoglobin A1c    Polycythemia  -     CBC    Cerebrovascular accident (CVA) with involvement of left side of body (CMS-HCC)  -     Ambulatory referral to Neurology; Future    Primary hypertension  -     carvedilol (COREG) 6.25 MG tablet; Take 1 tablet (6.25 mg total) by mouth two (2) times a day.    Rheumatoid arthritis, involving unspecified site, unspecified whether rheumatoid factor present (CMS-HCC)  -     Ambulatory referral to Pain Clinic; Future          ----------------------------------------------------------------------------    57yo male presents today for follow up.     #Alcohol use disorder, Emergency Room Follow up  -He did not discuss this emergency room visit with me and stated that he did not go to the Emergency room       #Chronic pain, Rheumatoid Arthritis  -Diagnosed with RA in 1998.   -Failed: Humira, Enbrel and Orencia, Harriette Ohara, Simponi   -Previously tried: Methotrexate with Folic Acid, Prednisone, Remicade infusion  -Currently taking: Methotrexate, Simponi (every 28 days)  -He is actively receiving Tramadol 50mg  BID and Gabapeint 300mg  TID with last fill date on 03/17/22.  -Pain management referral was replaced again.He stated that the Pain management denied him stating they dont have the medications for him. He stated that they sent him this message but was unable to find the message to show me today. We will need to discuss that we can no longer fill muscle relaxants as he is receiving additional medications for pain by multiple providers.  -He is having active RA flare but declined to have imaging obtained today  -Advised against using Ibuprofen and Midol together as both NSAIDs    #CVA, impaired mobility of ADL  -Previous Right basal ganglia IPH while on DAPT for Right Thalamic Ischemic stroke. He does have left sided deficits.   -He has not seen Neurology yet. Referral was placed by ED staff during ED visit for acute alcohol intoxication but referral did not go through   -Replaced referral for Neurology  -Previously tried to re-refer to OT/PT but he states he does not need it. He declined these referrals again.   -Currently on DAPT: Plavix 75mg  and Aspirin 81mg    -Statin: Atorvastatin 80mg       #Type 2 DM  -Due for Urine Microalbumin. We will repeat A1C as well   -Has not had eye exam; referral was placed previously. Discussed importance to have performed   -To continue Metformin 500mg  BID until labs return  -Foot Exam due 11/15/2022    #Hypertension  -At goal per ADA  -Refilled  Coreg 6.25mg  BID      #Preventative Health  -Referral for colonoscopy was placed previously but not done   -PSA was WNL on 11/14/2021. May repeat 11/2022  -Still due for second Shingles vaccine.   -He does meet criteria for LDCT but previously declined Lung Cancer screening      Chronic care follow up in 4 months    Subjective:     Paul Hsu Sr. 57 y.o.male presents for follow up     Since last visit   -Seen in the ED on 02/24/2022 for Alcohol intoxication. MDM from ED note:   Initial differential includes but is not limited to alcohol intoxication, delta 8/THC intoxication, amongst multiple etiologies.  Basic labs are obtained in triage including CBC, CMP, and UA.  CBC with normal WBC, hemoglobin 17.7, platelets normal.  CMP with reassuring electrolytes, renal function, bilirubin and LFTs.  UA without any evidence of UTI.EKG also obtained in triage and independently reviewed myself and shows sinus tachycardia with rate of 113 bpm.  QTc of 441.  No significant ST elevations or depressions.  No obvious signs of acute ischemia arrhythmia at this time.  Anticipate patient's symptoms are likely due to to his intoxication with alcohol and delta 8.  He reports that he was watching [employer] on TV when he became a part of it.  Denies any SI or HI.  Appears to be back at his baseline per caretaker.  Discussed with caretaker patient plan to discharge and will give neurology follow-up for his history of stroke.  Recommended close PCP follow-up.  They understand and are agreeable to plan.     ------------------------------------------------  He is here with his girlfriend  -He fell and hit his head 5 days  -He fell in his tub and had to have EMS and police to come and help him up.   -he is having an RA Flare in his right hand and right shoulder---which started yesteray   -he fell off the bad this morning.   -He states that the pain management doctor told him that they dont have medications to give but recommended for him to exercise.   -he states the was [facility] by the [facility]ologist [facility]   -he notes     ROS:     Review of systems negative unless otherwise noted as per HPI    Vital Signs:     Wt Readings from Last 3 Encounters:   04/11/22 82.1 kg (180 lb 14.4 oz)   02/23/22 83 kg (183 lb)   01/12/22 83.1 kg (183 lb 3.2 oz)     Temp Readings from Last 3 Encounters:   04/11/22 37.1 ??C (98.8 ??F) (Temporal)   02/24/22 36.9 ??C (98.4 ??F) (Oral)   01/09/22 37.3 ??C (99.2 ??F) (Temporal)     BP Readings from Last 3 Encounters:   04/11/22 130/80   02/24/22 (!) 129/99   01/12/22 114/79     Pulse Readings from Last 3 Encounters:   04/11/22 84   02/24/22 104   01/12/22 106       Objective:     General Appearance: Alert. Not toxic or ill  HEENT EOMI, there is ecchymosis around left eye but no swelling noted. He does not have subconjunctival hemorrhage.   Cardiac: Tachycardic on exam but regular rhythm  RESP: Normal respiratory effort. Decreased air movements noted.   MSK: steppage gait noted. Not using a cane for ambulation. He has soft splint on  left arm, no shoe on left foot. There is swelling of his 4th right MCP joint. He also endorses pain/swelling of his right shoulder. His left fingers are stiff, somewhat stuck in flexion.   NEURO: Cranial nerves II- XII grossly intact.  No focal neurologic deficits  PSYCHIATRIC: Became tearful during exam  SKIN: appears to have seborrheic dermatitis on scalp

## 2022-04-19 DIAGNOSIS — F54 Psychological and behavioral factors associated with disorders or diseases classified elsewhere: Secondary | ICD-10-CM | POA: Diagnosis not present

## 2022-04-19 DIAGNOSIS — F4312 Post-traumatic stress disorder, chronic: Secondary | ICD-10-CM | POA: Diagnosis not present

## 2022-04-21 IMAGING — MR MR MRA HEAD W/O CM
1 series · 25 of 48 positions shown · IV contrast (gadavist)
Comparison: No prior MRI, correlation is made with CT head
06/01/2021

CLINICAL DATA: Numbness in left hand, stroke suspected

EXAM:
MRI HEAD WITHOUT CONTRAST
MRA HEAD WITHOUT CONTRAST
MRA NECK WITHOUT AND WITH CONTRAST
TECHNIQUE: Multiplanar, multi-echo pulse sequences of the brain and surrounding
structures were acquired without intravenous contrast. Angiographic
images of the Circle of Willis were acquired using MRA technique
without intravenous contrast. Angiographic images of the neck were
acquired using MRA technique without and with intravenous contrast.
Carotid stenosis measurements (when applicable) are obtained
utilizing NASCET criteria, using the distal internal carotid
diameter as the denominator.
CONTRAST:  7.5mL GADAVIST GADOBUTROL 1 MMOL/ML IV SOLN

[Series 1: TOF · axial · B · 0.5mm · 0.48mm/px · z∈[-75,+22]mm · 25 of 217 slices shown]
[im 1/217]
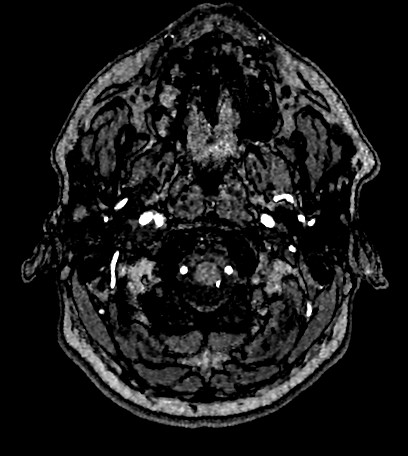
[im 5/217]
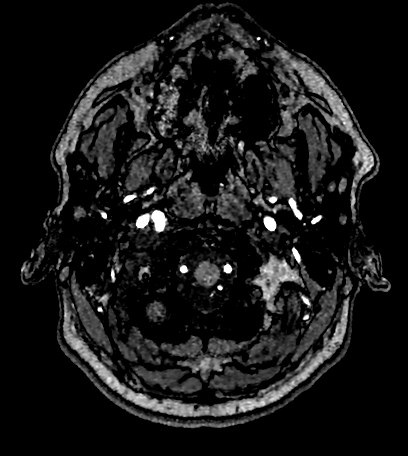
[im 10/217]
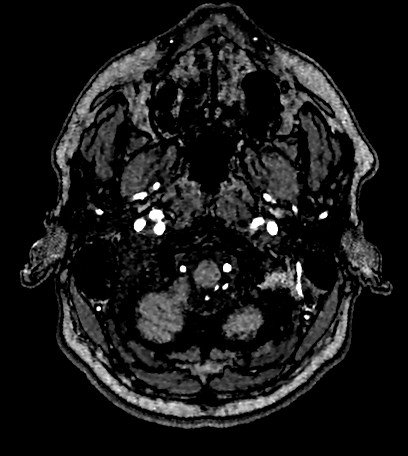
[im 14/217]
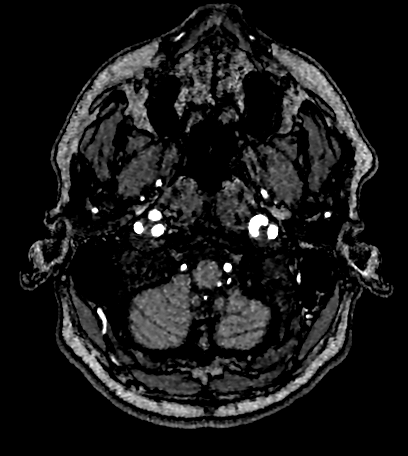
[im 19/217]
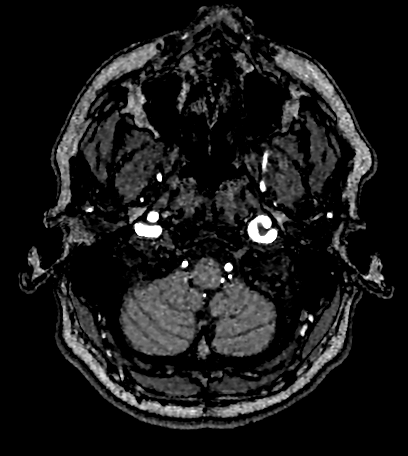
[im 23/217]
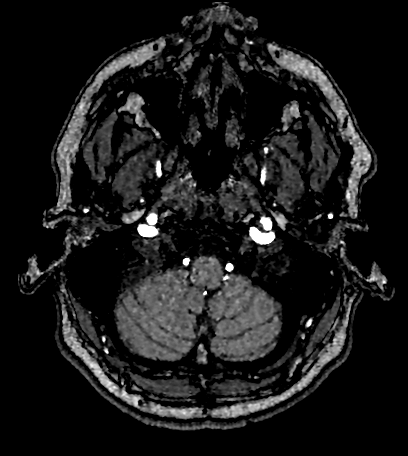
[im 28/217]
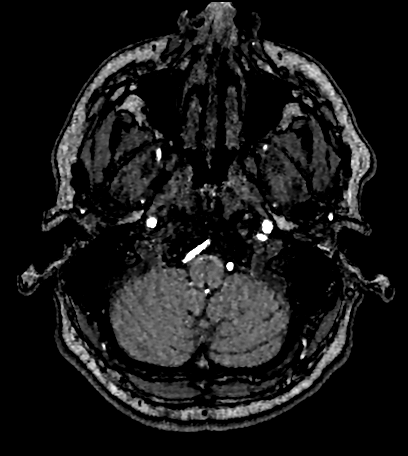
[im 33/217]
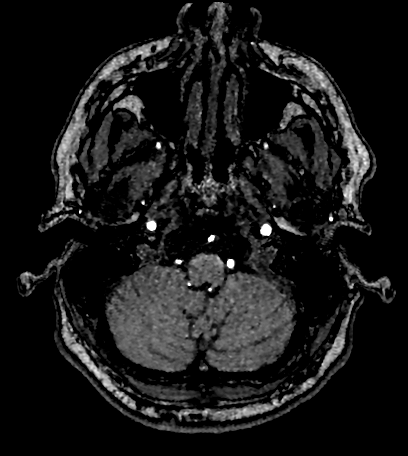
[im 37/217]
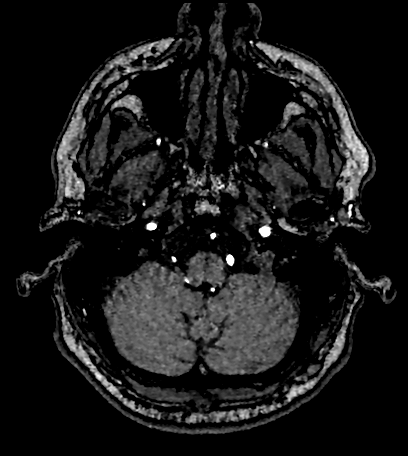
[im 42/217]
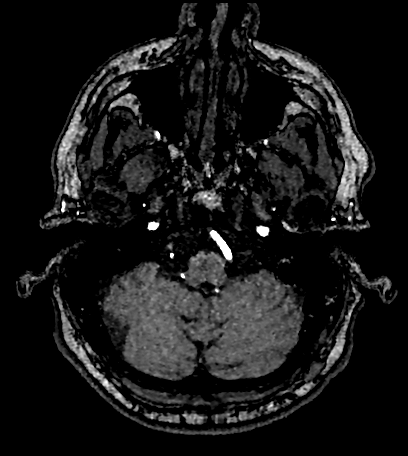
[im 46/217]
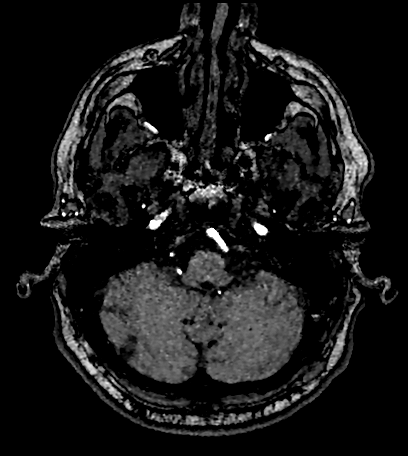
[im 51/217]
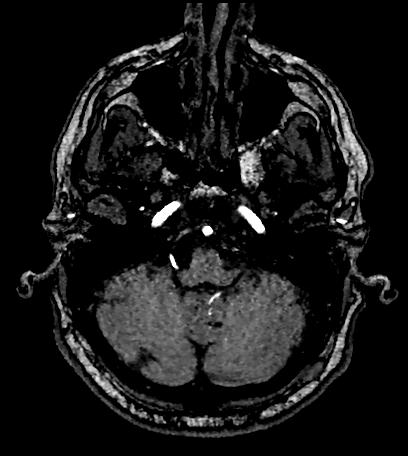
[im 56/217]
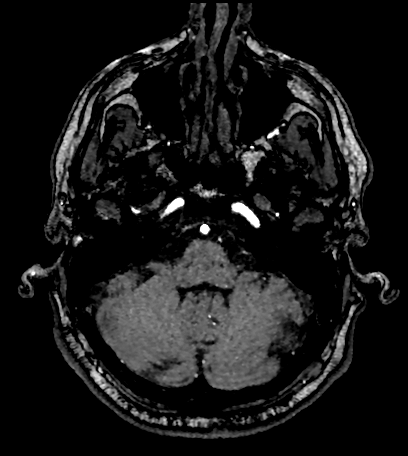
[im 60/217]
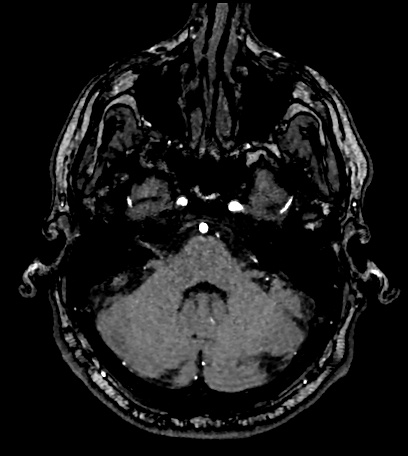
[im 65/217]
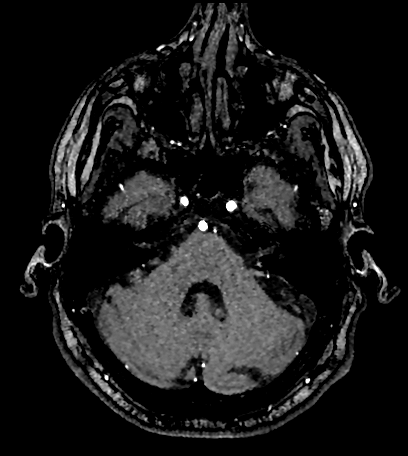
[im 69/217]
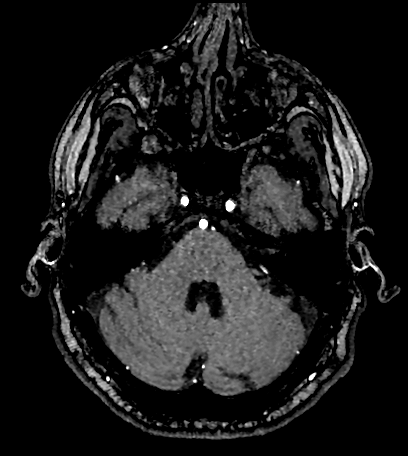
[im 74/217]
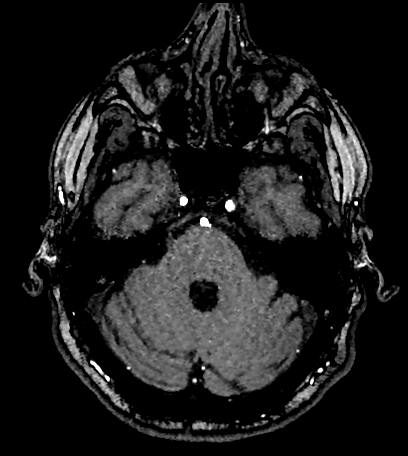
[im 79/217]
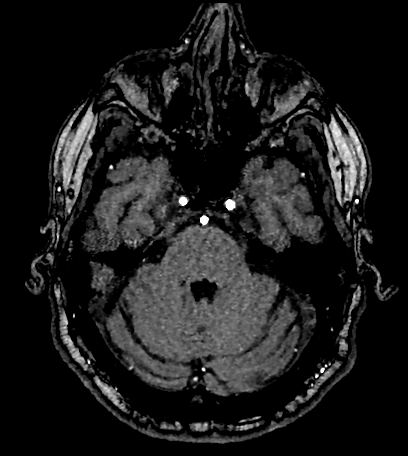
[im 97/217]
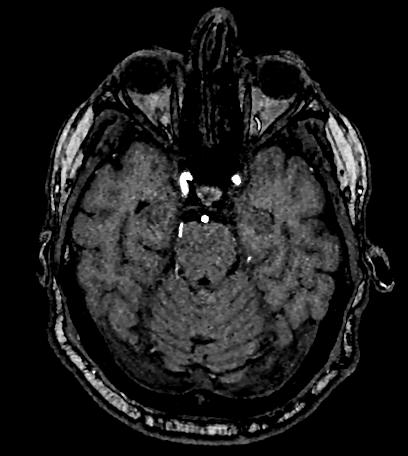
[im 111/217]
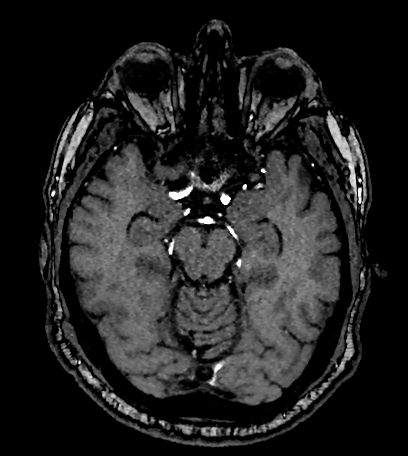
[im 125/217]
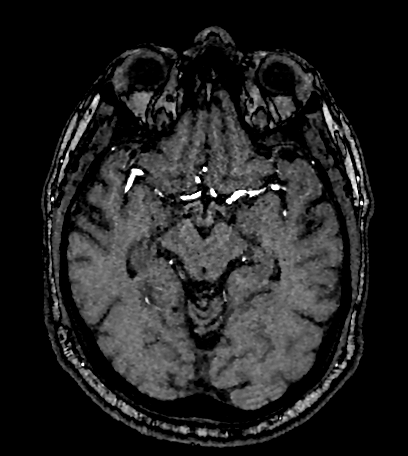
[im 152/217]
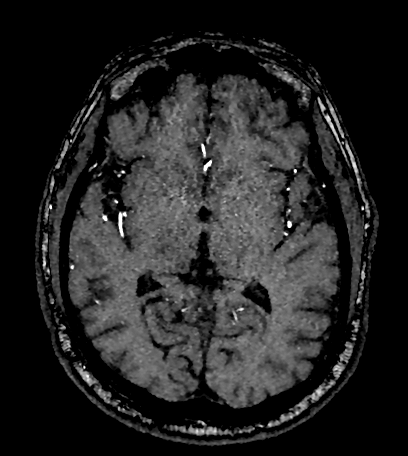
[im 180/217]
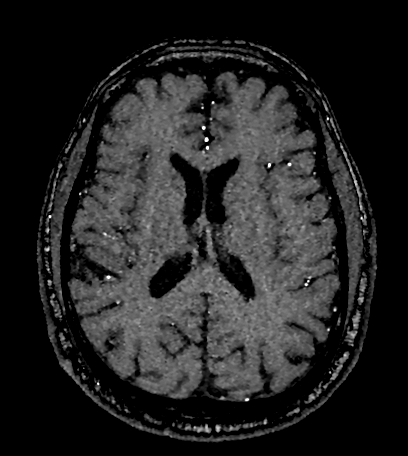
[im 184/217]
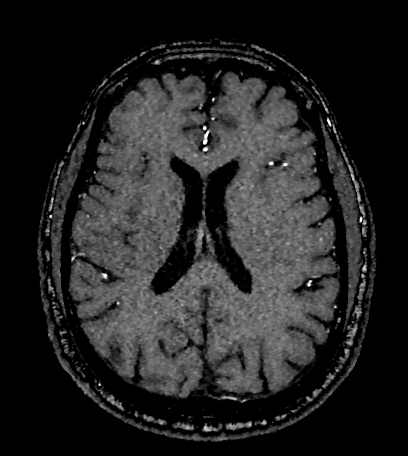
[im 207/217]
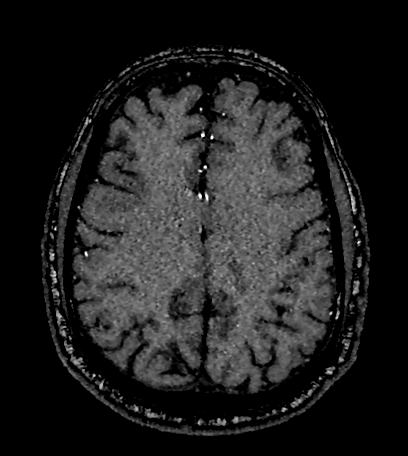

[25 of 48 positions shown; findings below may reference images not displayed]

FINDINGS: MRI HEAD FINDINGS

Brain: Restricted diffusion with ADC correlate in the right thalamus
(series 5, image 85 and series 6, image 26), measuring approximately
1.1 x 0.7 cm. No additional foci of restricted diffusion are seen.
No acute hemorrhage, mass, mass effect, or midline shift. No
hydrocephalus or extra-axial collection.

Vascular: Please see MRA findings below.

Skull and upper cervical spine: Normal marrow signal.

Sinuses/Orbits: Mild mucosal thickening in the ethmoid air cells.
The orbits are unremarkable.

Other: The mastoids are well aerated.

MRA HEAD FINDINGS

Anterior circulation: Both internal carotid arteries are patent to
the termini, without significant stenosis.

A1 segments patent. Normal anterior communicating artery. Anterior
cerebral arteries are patent to their distal aspects.

No M1 stenosis or occlusion. Normal MCA bifurcations. Distal MCA
branches perfused and symmetric.

Posterior circulation: Vertebral arteries patent to the
vertebrobasilar junction without stenosis. Posterior inferior
cerebral arteries patent bilaterally.

Basilar patent to its distal aspect. Superior cerebellar arteries
patent bilaterally.

Patent P1 segments. PCAs perfused to their distal aspects without
stenosis. The bilateral posterior communicating arteries are
visualized.

Anatomic variants: None significant

MRA NECK FINDINGS

Common, internal, and external carotid arteries are patent, without
hemodynamically significant stenosis. Extracranial vertebral
arteries are patent, without hemodynamically significant stenosis.

Other: None
IMPRESSION: 1. Acute infarct in the right thalamus.
2. No intracranial large vessel occlusion or significant stenosis.
3. No hemodynamically significant stenosis in the neck.

These results were called by telephone at the time of interpretation
on 06/01/2021 at [DATE] to provider GARRY JUNIOR, who verbally acknowledged
these results.

## 2022-04-21 IMAGING — MR MR HEAD W/O CM
13 series · 47 of 48 positions shown · IV contrast (gadavist)
Comparison: No prior MRI, correlation is made with CT head
06/01/2021

CLINICAL DATA: Numbness in left hand, stroke suspected

EXAM:
MRI HEAD WITHOUT CONTRAST
MRA HEAD WITHOUT CONTRAST
MRA NECK WITHOUT AND WITH CONTRAST
TECHNIQUE: Multiplanar, multi-echo pulse sequences of the brain and surrounding
structures were acquired without intravenous contrast. Angiographic
images of the Circle of Willis were acquired using MRA technique
without intravenous contrast. Angiographic images of the neck were
acquired using MRA technique without and with intravenous contrast.
Carotid stenosis measurements (when applicable) are obtained
utilizing NASCET criteria, using the distal internal carotid
diameter as the denominator.
CONTRAST:  7.5mL GADAVIST GADOBUTROL 1 MMOL/ML IV SOLN

[Series 5: ax dwi_tracew · axial · B · 3.0mm · 0.71mm/px · z∈[-85,+80]mm · 8 of 112 slices shown]
[im 1/112]
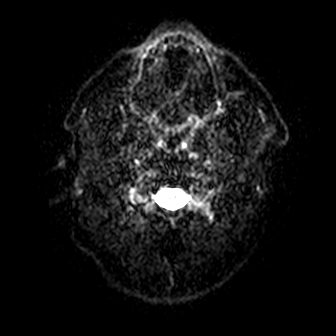
[im 16/112]
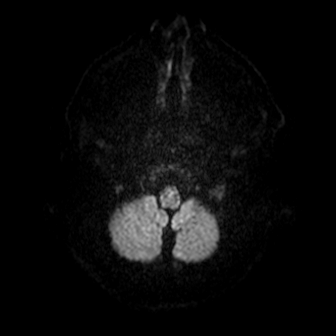
[im 32/112]
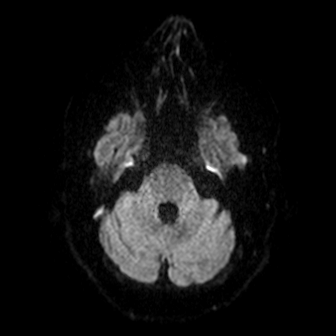
[im 48/112]
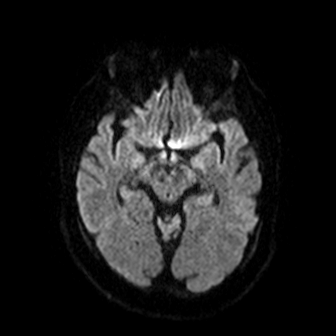
[im 64/112]
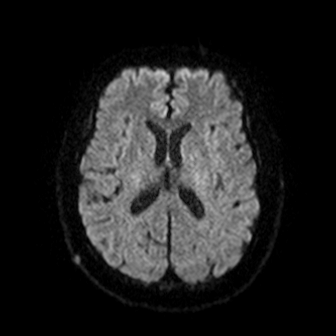
[im 80/112]
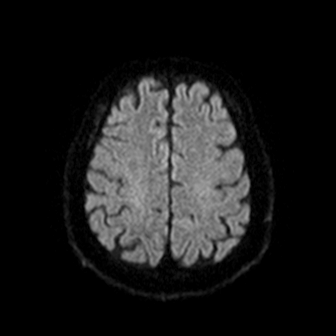
[im 96/112]
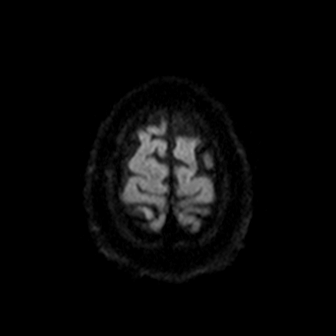
[im 112/112]
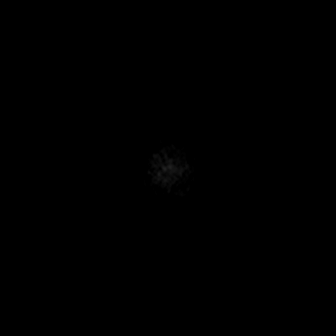

[Series 6: ax dwi_adc · axial · B · 3.0mm · 0.71mm/px · z∈[-85,+80]mm · 3 of 56 slices shown]
[im 1/56]
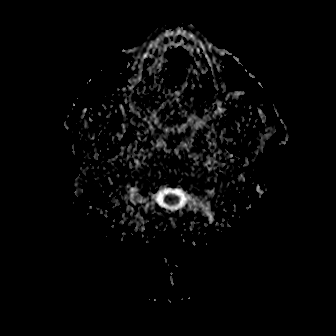
[im 28/56]
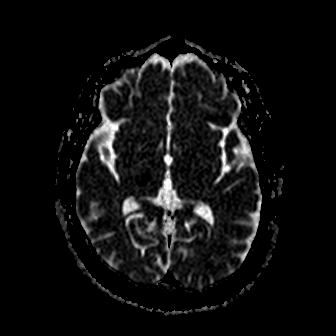
[im 56/56]
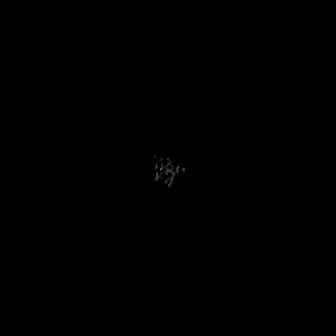

[Series 7: cor dwi_tracew · coronal · B · 5.0mm · 0.68mm/px · 5 of 80 slices shown]
[im 1/80]
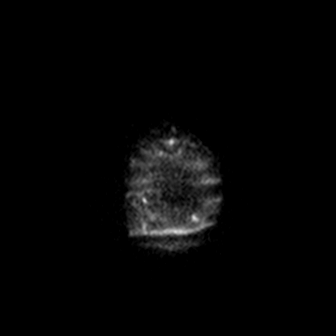
[im 20/80]
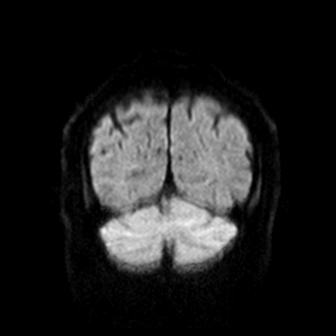
[im 40/80]
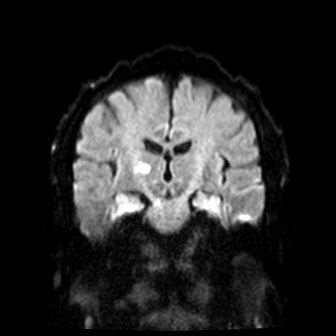
[im 60/80]
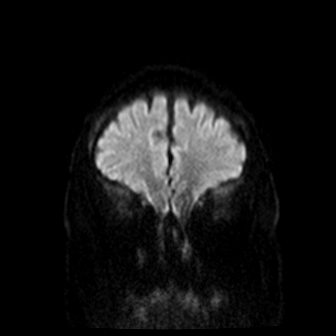
[im 80/80]
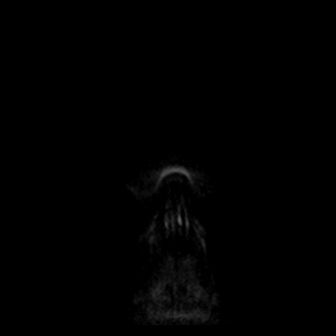

[Series 8: cor dwi_adc · coronal · B · 5.0mm · 0.68mm/px · 2 of 40 slices shown]
[im 1/40]
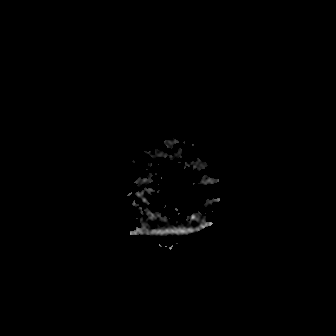
[im 40/40]
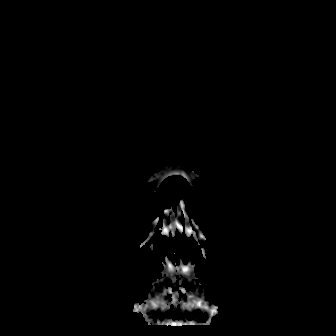

[Series 9: T1 · sagittal · B · 5.0mm · 0.47mm/px · 1 of 24 slices shown (1 of 2)]
[im 1/24]
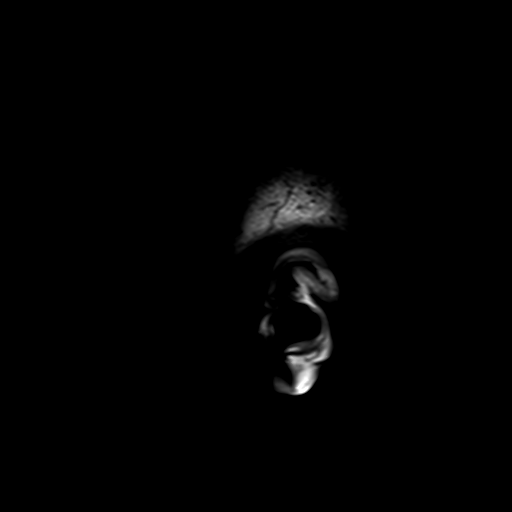

[Series 10: T2 · axial · B · 5.0mm · 0.86mm/px · z∈[-79,+77]mm · 2 of 27 slices shown (1 of 2)]
[im 1/27]
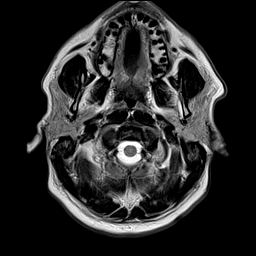
[im 27/27]
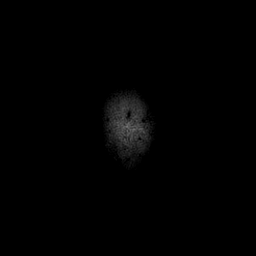

[Series 11: ax swi_mag · axial · B · 3.0mm · 0.90mm/px · z∈[-83,+82]mm · 3 of 56 slices shown]
[im 1/56]
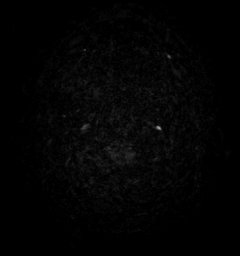
[im 28/56]
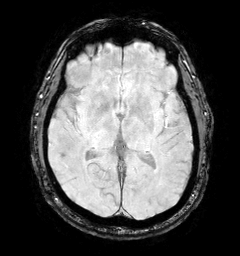
[im 56/56]
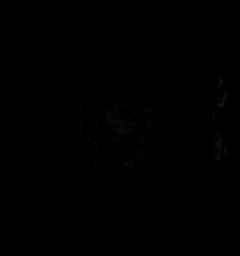

[Series 12: ax swi_pha · axial · B · 3.0mm · 0.90mm/px · z∈[-83,+82]mm · 3 of 56 slices shown]
[im 1/56]
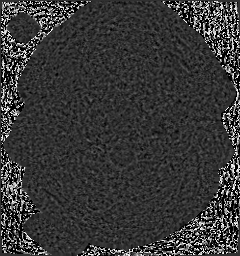
[im 28/56]
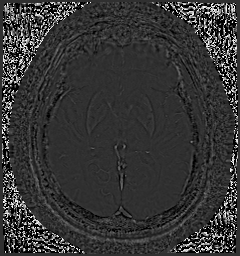
[im 56/56]
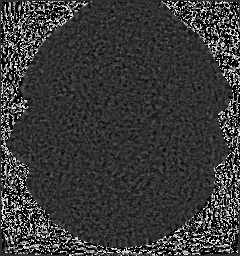

[Series 13: ax swi_swi · axial · B · 3.0mm · 0.90mm/px · z∈[-83,+82]mm · 3 of 56 slices shown]
[im 1/56]
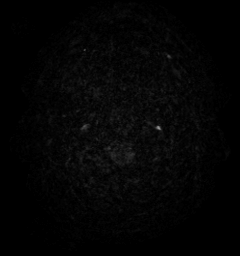
[im 28/56]
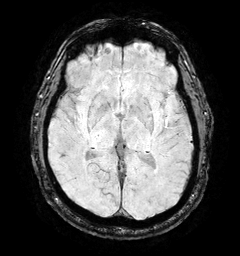
[im 56/56]
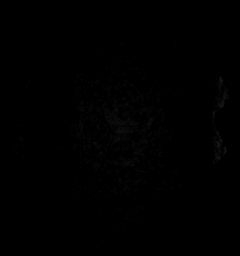

[Series 14: ax swi_swi_mip · axial · B · 24.0mm · 0.90mm/px · z∈[-73,+71]mm · 3 of 49 slices shown]
[im 1/49]
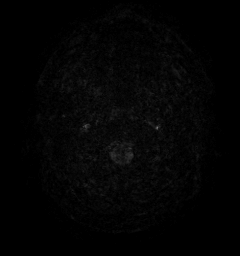
[im 25/49]
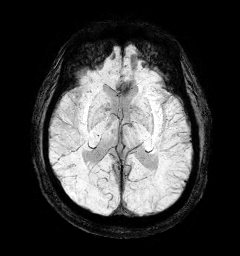
[im 49/49]
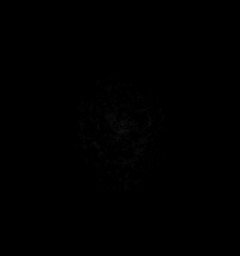

[Series 15: FLAIR · axial · B · 3.0mm · 0.69mm/px · z∈[-82,+80]mm · 3 of 55 slices shown]
[im 1/55]
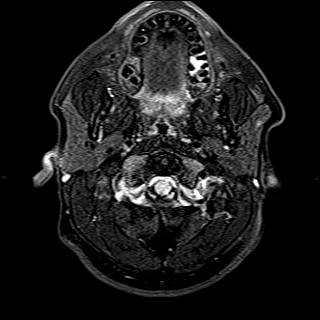
[im 28/55]
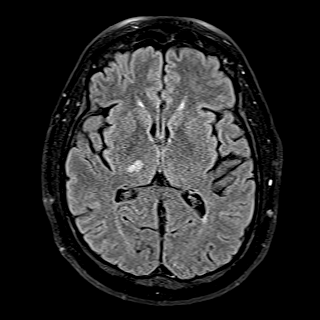
[im 55/55]
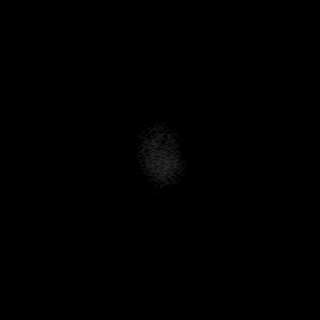

[Series 16: T1 · axial · B · 1.0mm · 0.98mm/px · z∈[-87,+88]mm · 9 of 176 slices shown (2 of 2)]
[im 1/176]
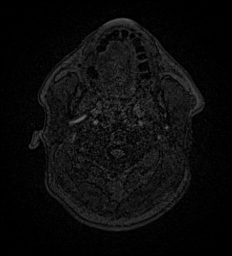
[im 20/176]
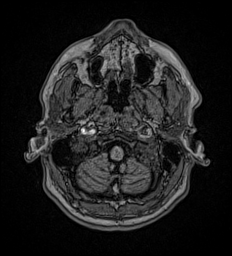
[im 39/176]
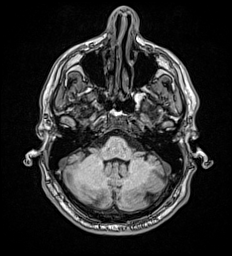
[im 59/176]
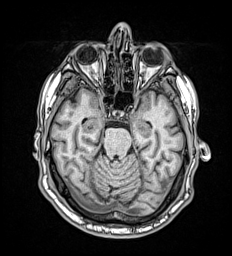
[im 78/176]
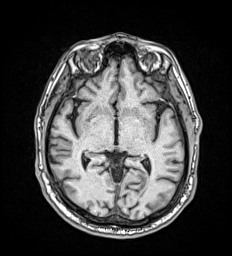
[im 98/176]
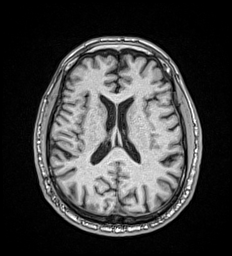
[im 117/176]
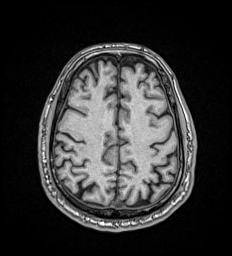
[im 156/176]
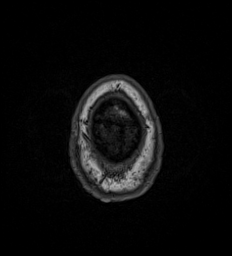
[im 176/176]
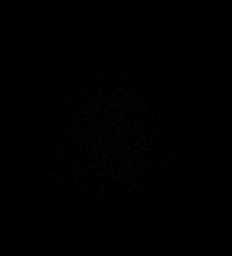

[Series 17: T2 · coronal · B · 5.0mm · 0.45mm/px · 2 of 33 slices shown (2 of 2)]
[im 1/33]
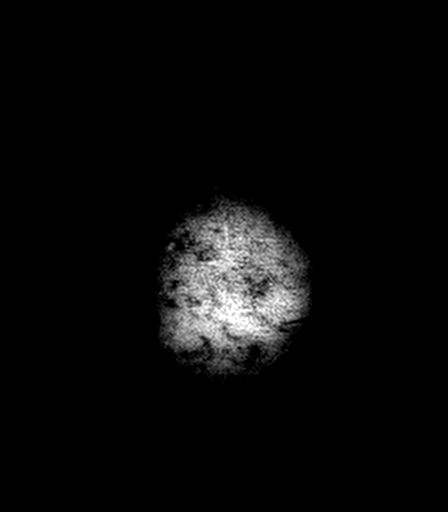
[im 33/33]
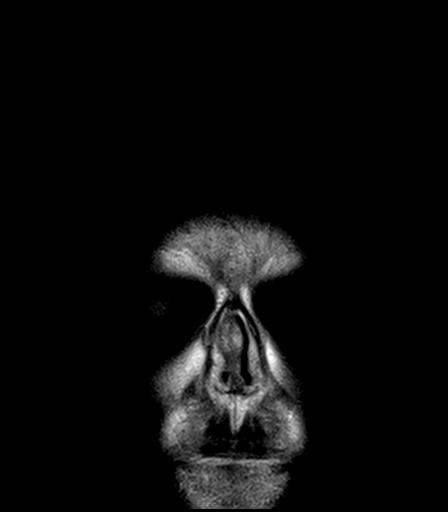

[47 of 48 positions shown; findings below may reference images not displayed]

FINDINGS: MRI HEAD FINDINGS

Brain: Restricted diffusion with ADC correlate in the right thalamus
(series 5, image 85 and series 6, image 26), measuring approximately
1.1 x 0.7 cm. No additional foci of restricted diffusion are seen.
No acute hemorrhage, mass, mass effect, or midline shift. No
hydrocephalus or extra-axial collection.

Vascular: Please see MRA findings below.

Skull and upper cervical spine: Normal marrow signal.

Sinuses/Orbits: Mild mucosal thickening in the ethmoid air cells.
The orbits are unremarkable.

Other: The mastoids are well aerated.

MRA HEAD FINDINGS

Anterior circulation: Both internal carotid arteries are patent to
the termini, without significant stenosis.

A1 segments patent. Normal anterior communicating artery. Anterior
cerebral arteries are patent to their distal aspects.

No M1 stenosis or occlusion. Normal MCA bifurcations. Distal MCA
branches perfused and symmetric.

Posterior circulation: Vertebral arteries patent to the
vertebrobasilar junction without stenosis. Posterior inferior
cerebral arteries patent bilaterally.

Basilar patent to its distal aspect. Superior cerebellar arteries
patent bilaterally.

Patent P1 segments. PCAs perfused to their distal aspects without
stenosis. The bilateral posterior communicating arteries are
visualized.

Anatomic variants: None significant

MRA NECK FINDINGS

Common, internal, and external carotid arteries are patent, without
hemodynamically significant stenosis. Extracranial vertebral
arteries are patent, without hemodynamically significant stenosis.

Other: None
IMPRESSION: 1. Acute infarct in the right thalamus.
2. No intracranial large vessel occlusion or significant stenosis.
3. No hemodynamically significant stenosis in the neck.

These results were called by telephone at the time of interpretation
on 06/01/2021 at [DATE] to provider GARRY JUNIOR, who verbally acknowledged
these results.

## 2022-04-21 IMAGING — MR MR MRA NECK WO/W CM
3 of 4 series · 33 of 48 positions shown · IV contrast (gadavist)
Comparison: No prior MRI, correlation is made with CT head
06/01/2021

CLINICAL DATA: Numbness in left hand, stroke suspected

EXAM:
MRI HEAD WITHOUT CONTRAST
MRA HEAD WITHOUT CONTRAST
MRA NECK WITHOUT AND WITH CONTRAST
TECHNIQUE: Multiplanar, multi-echo pulse sequences of the brain and surrounding
structures were acquired without intravenous contrast. Angiographic
images of the Circle of Willis were acquired using MRA technique
without intravenous contrast. Angiographic images of the neck were
acquired using MRA technique without and with intravenous contrast.
Carotid stenosis measurements (when applicable) are obtained
utilizing NASCET criteria, using the distal internal carotid
diameter as the denominator.
CONTRAST:  7.5mL GADAVIST GADOBUTROL 1 MMOL/ML IV SOLN

[Series 9: angio_fl3d_cor_pre_ttc=2.0s · coronal · B · 0.9mm · 0.85mm/px · 11 of 96 slices shown]
[im 1/96]
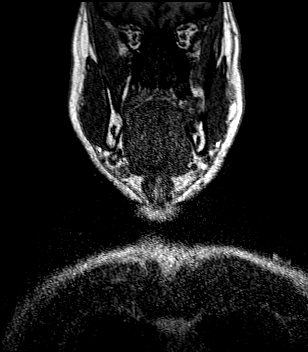
[im 10/96]
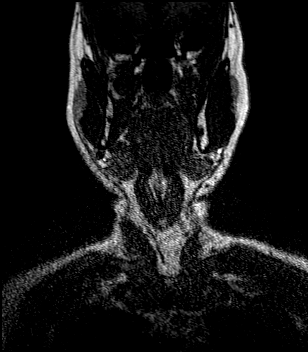
[im 20/96]
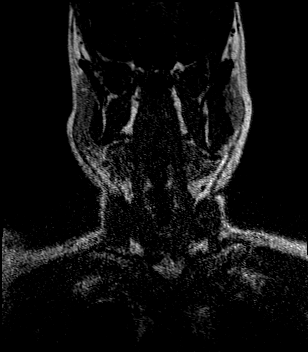
[im 29/96]
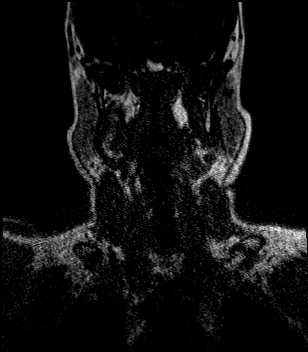
[im 39/96]
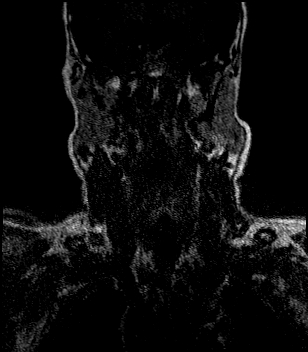
[im 48/96]
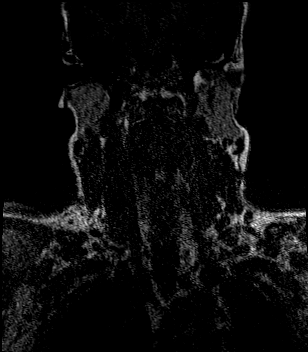
[im 58/96]
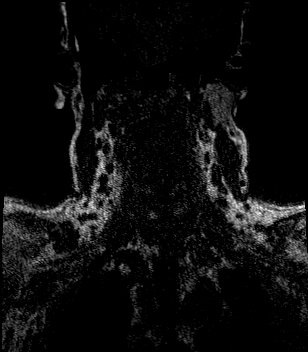
[im 67/96]
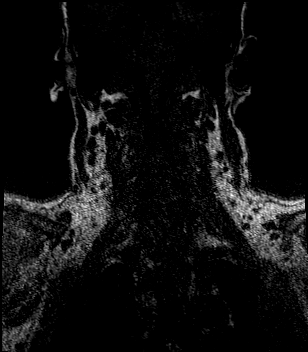
[im 77/96]
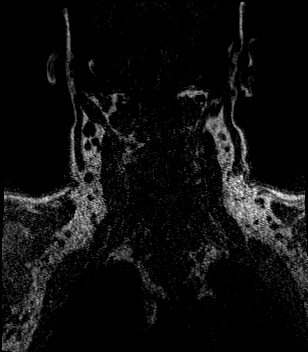
[im 86/96]
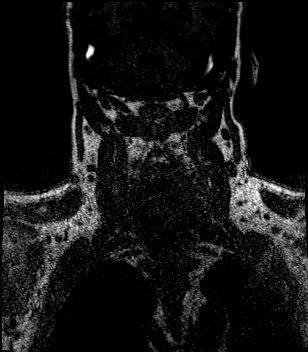
[im 96/96]
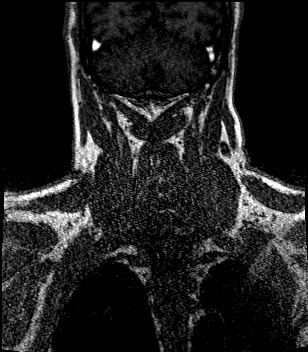

[Series 12: angio_fl3d_cor_post_ttc=2.0s_moco-adv · coronal · B · 0.9mm · 0.85mm/px · 11 of 94 slices shown]
[im 1/94]
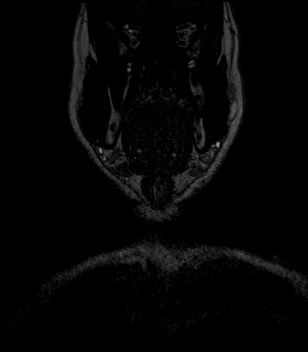
[im 10/94]
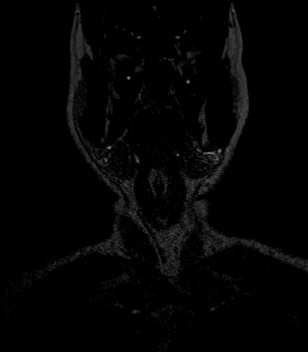
[im 19/94]
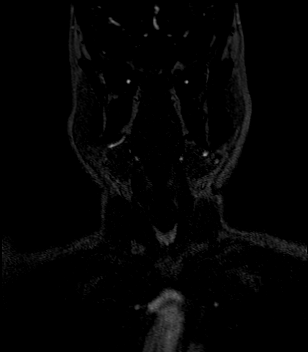
[im 28/94]
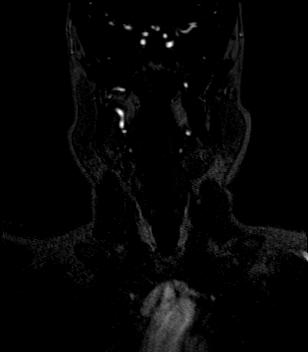
[im 38/94]
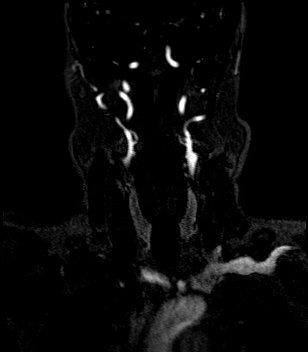
[im 47/94]
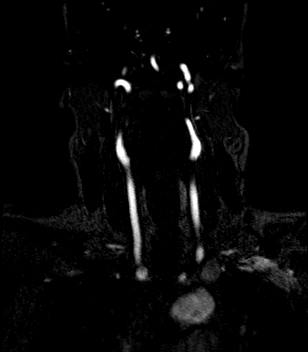
[im 56/94]
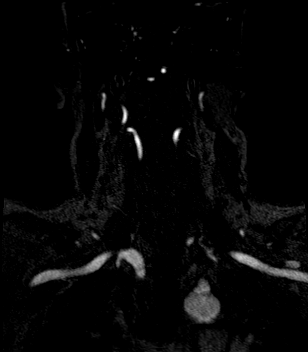
[im 66/94]
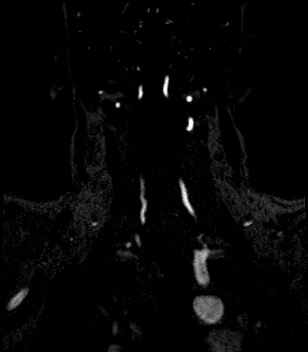
[im 75/94]
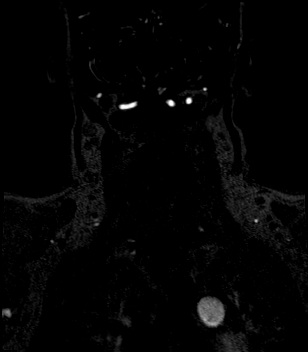
[im 84/94]
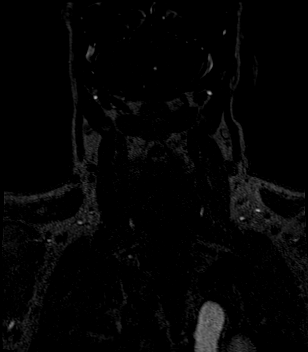
[im 94/94]
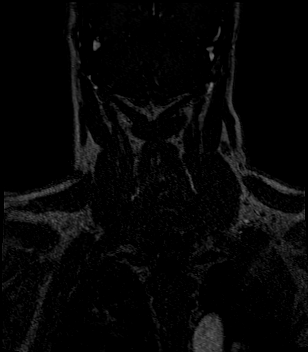

[Series 13: angio_fl3d_cor_post_ttc=2.0s_moco-adv_sub · coronal · B · 0.9mm · 0.85mm/px · 11 of 93 slices shown]
[im 1/93]
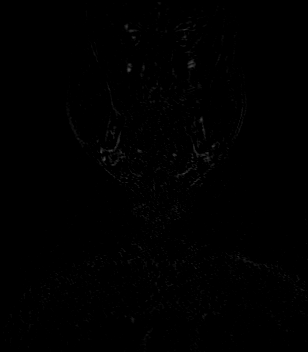
[im 10/93]
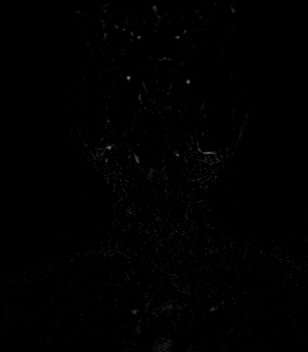
[im 19/93]
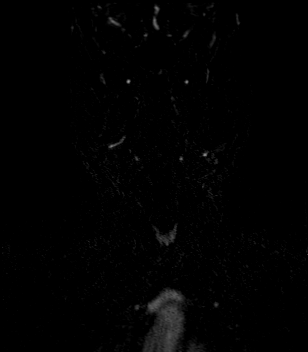
[im 28/93]
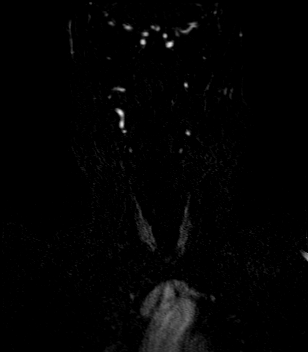
[im 37/93]
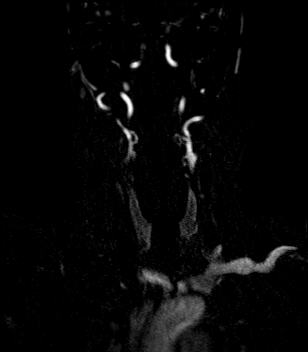
[im 47/93]
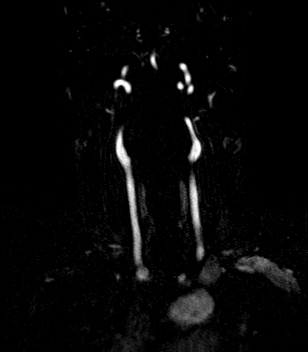
[im 56/93]
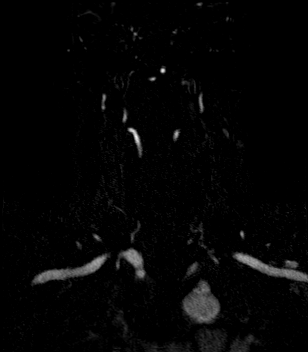
[im 65/93]
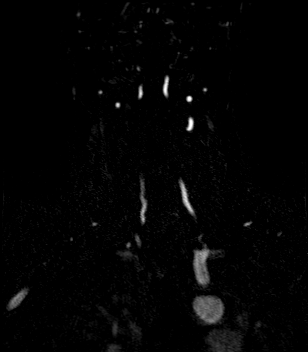
[im 74/93]
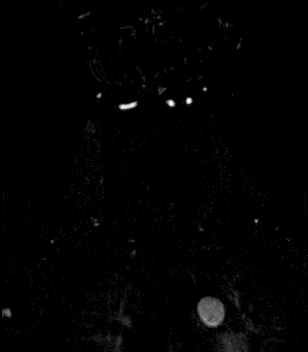
[im 83/93]
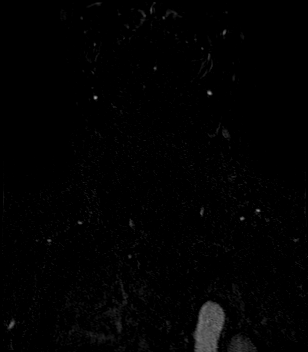
[im 93/93]
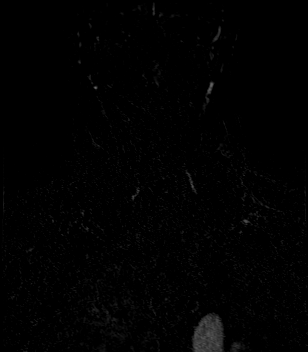

[33 of 48 positions shown; findings below may reference images not displayed]

FINDINGS: MRI HEAD FINDINGS

Brain: Restricted diffusion with ADC correlate in the right thalamus
(series 5, image 85 and series 6, image 26), measuring approximately
1.1 x 0.7 cm. No additional foci of restricted diffusion are seen.
No acute hemorrhage, mass, mass effect, or midline shift. No
hydrocephalus or extra-axial collection.

Vascular: Please see MRA findings below.

Skull and upper cervical spine: Normal marrow signal.

Sinuses/Orbits: Mild mucosal thickening in the ethmoid air cells.
The orbits are unremarkable.

Other: The mastoids are well aerated.

MRA HEAD FINDINGS

Anterior circulation: Both internal carotid arteries are patent to
the termini, without significant stenosis.

A1 segments patent. Normal anterior communicating artery. Anterior
cerebral arteries are patent to their distal aspects.

No M1 stenosis or occlusion. Normal MCA bifurcations. Distal MCA
branches perfused and symmetric.

Posterior circulation: Vertebral arteries patent to the
vertebrobasilar junction without stenosis. Posterior inferior
cerebral arteries patent bilaterally.

Basilar patent to its distal aspect. Superior cerebellar arteries
patent bilaterally.

Patent P1 segments. PCAs perfused to their distal aspects without
stenosis. The bilateral posterior communicating arteries are
visualized.

Anatomic variants: None significant

MRA NECK FINDINGS

Common, internal, and external carotid arteries are patent, without
hemodynamically significant stenosis. Extracranial vertebral
arteries are patent, without hemodynamically significant stenosis.

Other: None
IMPRESSION: 1. Acute infarct in the right thalamus.
2. No intracranial large vessel occlusion or significant stenosis.
3. No hemodynamically significant stenosis in the neck.

These results were called by telephone at the time of interpretation
on 06/01/2021 at [DATE] to provider GARRY JUNIOR, who verbally acknowledged
these results.

## 2022-04-26 DIAGNOSIS — F4312 Post-traumatic stress disorder, chronic: Secondary | ICD-10-CM | POA: Diagnosis not present

## 2022-04-26 DIAGNOSIS — F54 Psychological and behavioral factors associated with disorders or diseases classified elsewhere: Secondary | ICD-10-CM | POA: Diagnosis not present

## 2022-05-09 ENCOUNTER — Encounter: Payer: Self-pay | Admitting: Student in an Organized Health Care Education/Training Program

## 2022-05-09 ENCOUNTER — Ambulatory Visit
Payer: Medicare Other | Attending: Student in an Organized Health Care Education/Training Program | Admitting: Student in an Organized Health Care Education/Training Program

## 2022-05-09 VITALS — BP 123/95 | HR 89 | Temp 97.2°F | Resp 18 | Ht 67.0 in | Wt 181.3 lb

## 2022-05-09 DIAGNOSIS — F32A Depression, unspecified: Secondary | ICD-10-CM | POA: Diagnosis present

## 2022-05-09 DIAGNOSIS — F1290 Cannabis use, unspecified, uncomplicated: Secondary | ICD-10-CM | POA: Diagnosis present

## 2022-05-09 DIAGNOSIS — M069 Rheumatoid arthritis, unspecified: Secondary | ICD-10-CM | POA: Insufficient documentation

## 2022-05-09 DIAGNOSIS — I69998 Other sequelae following unspecified cerebrovascular disease: Secondary | ICD-10-CM | POA: Insufficient documentation

## 2022-05-09 DIAGNOSIS — R531 Weakness: Secondary | ICD-10-CM | POA: Insufficient documentation

## 2022-05-09 DIAGNOSIS — F419 Anxiety disorder, unspecified: Secondary | ICD-10-CM | POA: Diagnosis not present

## 2022-05-09 DIAGNOSIS — I639 Cerebral infarction, unspecified: Secondary | ICD-10-CM | POA: Diagnosis not present

## 2022-05-09 DIAGNOSIS — F1010 Alcohol abuse, uncomplicated: Secondary | ICD-10-CM | POA: Insufficient documentation

## 2022-05-09 DIAGNOSIS — G894 Chronic pain syndrome: Secondary | ICD-10-CM | POA: Insufficient documentation

## 2022-05-09 NOTE — Unmapped (Signed)
[facility] Refill Coordination Note    Specialty Medication(s) to be Shipped:   Inflammatory Disorders: Simponi    Other medication(s) to be shipped: No additional medications requested for fill at this time     Paul Crome Sr., DOB: [birth year]  Phone: [phone] (home)       All above HIPAA information was verified with patient.     Was a Nurse, learning disability used for this call? No    Completed refill call assessment today to schedule patient's medication shipment from the [facility] [phone]).  All relevant notes have been reviewed.     Specialty medication(s) and dose(s) confirmed: Regimen is correct and unchanged.   Changes to medications: Jaxzon reports no changes at this time.  Changes to insurance: No  New side effects reported not previously addressed with a pharmacist or physician: None reported  Questions for the pharmacist: No    Confirmed patient received a Conservation officer, historic buildings and a Surveyor, mining with first shipment. The patient will receive a drug information handout for each medication shipped and additional FDA Medication Guides as required.       DISEASE/MEDICATION-SPECIFIC INFORMATION        For patients on injectable medications: Patient currently has 0 doses left.  Next injection is scheduled for 05/27/2022.    SPECIALTY MEDICATION ADHERENCE     Medication Adherence    Patient reported X missed doses in the last month: 0  Specialty Medication: Simponi  Patient is on additional specialty medications: No  Patient is on more than two specialty medications: No  Any gaps in refill history greater than 2 weeks in the last 3 months: no  Demonstrates understanding of importance of adherence: yes  Informant: patient  Confirmed plan for next specialty medication refill: delivery by pharmacy  Refills needed for supportive medications: not needed          Refill Coordination    Has the Patients' Contact Information Changed: No  Is the Shipping Address Different: No         Were doses missed due to medication being on hold? No    SIMPONI 50 /0.5  mg/ml: 0 days of medicine on hand       REFERRAL TO PHARMACIST     Referral to the pharmacist: Not needed      [facility]     Shipping address confirmed in Epic.     Patient was notified of new phone menu : No    Delivery Scheduled: Yes, Expected medication delivery date: 05/18/2022.     Medication will be delivered via Same Day Courier to the prescription address in Epic WAM.    Kerby Less   [facility] Specialty Technician

## 2022-05-09 NOTE — Progress Notes (Signed)
Safety precautions to be maintained throughout the outpatient stay will include: orient to surroundings, keep bed in low position, maintain call bell within reach at all times, provide assistance with transfer out of bed and ambulation.  

## 2022-05-09 NOTE — Progress Notes (Signed)
Patient: Patrick Perez  Service Category: E/M  Provider: Gillis Santa, MD  DOB: [birth year]  DOS: 05/09/2022  Referring Provider: Narda Amber Pap, MD  MRN: [id number]  Setting: Ambulatory outpatient  PCP: Narda Amber Pap, MD  Type: New Patient  Specialty: Interventional Pain Management    Location: Office  Delivery: Face-to-face     Primary Reason(s) for Visit: Encounter for initial evaluation of one or more chronic problems (new to examiner) potentially causing chronic pain, and posing a threat to normal musculoskeletal function. (Level of risk: High) CC: Joint Pain (Generalized; knees shoulders and hands are the worst)  HPI  Patrick Perez is a 57 y.o. year old, male patient, who comes for the first time to our practice referred by Sherian Maroon, Benison Pap, MD for our initial evaluation of his chronic pain. He has Laceration of right hand with complication; RA (rheumatoid arthritis) ([place]); Rheumatoid arthritis ([place]); Stroke [facility]); CAP (community acquired pneumonia); Hyponatremia; Sepsis ([place]); Abnormal LFTs; Diabetes mellitus without complication ([place]); Anxiety and depression; Weakness due to old stroke; Alcohol abuse; Marijuana smoker; and Chronic pain syndrome on their problem list. Today he comes in for evaluation of his Joint Pain (Generalized; knees shoulders and hands are the worst)  Pain Assessment: Location:   Other (Comment) (joint) Radiating: when experiencing flares, pain radiates Onset: More than a month ago Duration: Chronic pain Quality: Shooting Severity: 7 /10 (subjective, self-reported pain score)  Effect on ADL: limits daly activities Timing: Constant Modifying factors: heat and ice BP: (!) 123/95  HR: 89  Onset and Duration: Present longer than 3 months2005 Cause of pain: Unknown  Timing: Morning, Afternoon, Night, During activity or exercise, and After activity or exercise Aggravating Factors: Bending, Intercourse (sex), Kneeling, Lifiting, Motion, and Prolonged  standing Alleviating Factors: Cold packs, Hot packs, and TENS Associated Problems: Color changes, Night-time cramps, Depression, Erectile dysfunction, Fatigue, Impotence, Inability to concentrate, Inability to control bladder (urine), Numbness, Personality changes, Sadness, Sweating, Swelling, Temperature changes, Weakness, Pain that wakes patient up, and Pain that does not allow patient to sleep Quality of Pain: Agonizing, Annoying, Burning, Constant, Cruel, Deep, Disabling, Distressing, Dreadful, Exhausting, Fearful, Feeling of constriction, Feeling of weight, Getting longer, Heavy, Horrible, Hot, Lancinating, Nagging, Pressure-like, Pulsating, Punishing, Sharp, Shooting, Sickening, Splitting, Stabbing, Tender, Throbbing, Tingling, Tiring, Toothache-like, and Uncomfortable Previous Examinations or Tests: MRI scan and Neurological evaluation Previous Treatments: Physical Therapy, Steroid treatments by mouth, Strengthening exercises, and Stretching exercises  Patrick Perez is being evaluated for possible interventional pain management therapies for the treatment of his chronic pain.   Patrick Perez is a 57 year old male accompanied today by his girlfriend who presents with diffuse joint pain related to rheumatoid arthritis, history of left foot numbness status post CVA related to brain aneurysm currently on Plavix 75 mg, non-insulin-dependent diabetes, alcohol abuse, marijuana use, with a recent documented ED visit for hallucinations related to alcohol intoxication and delta 8 intake.  He states that he has tried physical therapy in the past.  He is on gabapentin 300 mg 3 times a day which he does find benefit from.  He has seen a rheumatologist at [facility] and is on methotrexate and other immune modifying therapies.  He also takes nortriptyline 75 mg.  He takes naproxen 220 mg as needed but has not taken some in the last couple of weeks.  He avoids acetaminophen given his history of liver disease.  He was prescribed  tramadol by his rheumatologist.   Mr. [name] has been informed that this initial visit was an evaluation only.  I informed him that I do not recommend any sort of chronic opioid therapy for his management.  I recommend that he follow-up with rheumatology for further management of his chronic pain related to rheumatoid arthritis.  Future considerations could include Lyrica.  I also suggested that he could consider alpha lipoic acid.  We also discussed Qutenza treatment for diabetic neuropathic pain.  Patient wanted to hold off on that.  Historic Controlled Substance Pharmacotherapy Review   Historical Monitoring: The patient  reports current drug use. Frequency: 7.00 times per week. Drug: Marijuana. List of prior UDS Testing: Lab Results  Component Value Date   MDMA NONE DETECTED 06/01/2021   MDMA NONE DETECTED 02/25/2019   MDMA NEGATIVE 10/29/2011   COCAINSCRNUR NONE DETECTED 06/01/2021   COCAINSCRNUR NONE DETECTED 02/25/2019   COCAINSCRNUR POSITIVE 10/29/2011   PCPSCRNUR NONE DETECTED 06/01/2021   PCPSCRNUR NONE DETECTED 02/25/2019   PCPSCRNUR NEGATIVE 10/29/2011   THCU POSITIVE (A) 06/01/2021   THCU POSITIVE (A) 02/25/2019   THCU POSITIVE 10/29/2011   ETH 285 (H) 02/25/2019   Historical Background Evaluation: [place] PMP: PDMP reviewed during this encounter. Review of the past 49-months conducted.               Department of public safety, offender search: Editor, commissioning Information) Non-contributory Risk Assessment Profile: Aberrant behavior: None observed or detected today Risk factors for fatal opioid overdose: history of alcoholism and liver disease Fatal overdose hazard ratio (HR): Calculation deferred Non-fatal overdose hazard ratio (HR): Calculation deferred Risk of opioid abuse or dependence: 0.7-3.0% with doses ? 36 MME/day and 6.1-26% with doses ? 120 MME/day. Substance use disorder (SUD) risk level: High   Pharmacologic Plan: As per protocol, I have not taken over any  controlled substance management, pending the results of ordered tests and/or consults.            Initial impression: Pending review of available data and ordered tests.  Meds   Current Outpatient Medications:    aspirin 81 MG chewable tablet, Chew 1 tablet (81 mg total) by mouth daily., Disp: 30 tablet, Rfl: 3   atorvastatin (LIPITOR) 80 MG tablet, TAKE 1 TABLET BY MOUTH EVERY DAY, Disp: 30 tablet, Rfl: 1   Cholecalciferol (VITAMIN D3) 10 MCG (400 UNIT) tablet, Take by mouth., Disp: , Rfl:    clopidogrel (PLAVIX) 75 MG tablet, TAKE 1 TABLET BY MOUTH EVERY DAY, Disp: 90 tablet, Rfl: 0   ferrous sulfate 325 (65 FE) MG tablet, Take by mouth., Disp: , Rfl:    folic acid (FOLVITE) 1 MG tablet, Take by mouth., Disp: , Rfl:    gabapentin (NEURONTIN) 300 MG capsule, Take 1 capsule (300 mg total) by mouth at bedtime. Dr Sunday Shams (Patient taking differently: Take 300 mg by mouth 3 (three) times daily. Dr Sunday Shams), Disp: 30 capsule, Rfl: 1   melatonin 3 MG TABS tablet, Take by mouth., Disp: , Rfl:    metFORMIN (GLUCOPHAGE) 500 MG tablet, TAKE 1 TABLET BY MOUTH 2 TIMES DAILY WITH A MEAL., Disp: 60 tablet, Rfl: 0   nortriptyline (PAMELOR) 10 MG capsule, Take 10 mg by mouth at bedtime., Disp: , Rfl:    OneTouch Delica Lancets [id number] MISC, USE TO TEST ONCE DAILY, Disp: 100 each, Rfl: 0   ONETOUCH ULTRA test strip, USE TO TEST ONCE DAILY, Disp: 100 strip, Rfl: 5   traMADol (ULTRAM) 50 MG tablet, Take by mouth every 6 (six) hours as needed., Disp: , Rfl:    clotrimazole-betamethasone (LOTRISONE) cream, Apply 1 application topically daily. (  Patient not taking: Reported on 07/07/2021), Disp: 30 g, Rfl: 0   DULoxetine (CYMBALTA) 30 MG capsule, TAKE 1 CAPSULE BY MOUTH EVERY DAY, Disp: 90 capsule, Rfl: 0   Naproxen Sodium 220 MG CAPS, Aleve 220 mg capsule  prn (Patient not taking: Reported on 07/07/2021), Disp: , Rfl:   Imaging Review  DG Shoulder Right  Narrative CLINICAL DATA:  Injured right  shoulder.  EXAM: RIGHT SHOULDER - 2+ VIEW  COMPARISON:  None.  FINDINGS: The glenohumeral and AC joints are maintained. No acute fracture. No abnormal soft tissue calcifications. The right ribs are intact and the right lung is clear.  IMPRESSION: No acute bony findings.   Electronically Signed By: Marijo Sanes M.D. On: 02/13/2021 12:05   Complexity Note: Imaging results reviewed.                         ROS  Cardiovascular: Daily Aspirin intake Pulmonary or Respiratory: Snoring  Neurological: stroke, incontinence Psychological-Psychiatric: Anxiousness and Depressed, PTSD Gastrointestinal: No reported gastrointestinal signs or symptoms such as vomiting or evacuating blood, reflux, heartburn, alternating episodes of diarrhea and constipation, inflamed or scarred liver, or pancreas or irrregular and/or infrequent bowel movements Genitourinary: No reported renal or genitourinary signs or symptoms such as difficulty voiding or producing urine, peeing blood, non-functioning kidney, kidney stones, difficulty emptying the bladder, difficulty controlling the flow of urine, or chronic kidney disease Hematological: No reported hematological signs or symptoms such as prolonged bleeding, low or poor functioning platelets, bruising or bleeding easily, hereditary bleeding problems, low energy levels due to low hemoglobin or being anemic Endocrine: High blood sugar controlled without the use of insulin (NIDDM) Rheumatologic: No reported rheumatological signs and symptoms such as fatigue, joint pain, tenderness, swelling, redness, heat, stiffness, decreased range of motion, with or without associated rash Musculoskeletal: Negative for myasthenia gravis, muscular dystrophy, multiple sclerosis or malignant hyperthermia  Laboratory Chemistry Profile   Renal Lab Results  Component Value Date   BUN 7 06/02/2021   CREATININE 0.76 06/02/2021   BCR 9 02/16/2021   GFRAA >60 02/25/2019   GFRNONAA  >60 06/02/2021   PROTEINUR NEGATIVE 06/01/2021     Electrolytes Lab Results  Component Value Date   NA 134 (L) 06/02/2021   K 4.3 06/02/2021   CL 102 06/02/2021   CALCIUM 8.7 (L) 06/02/2021   PHOS 2.6 (L) 02/16/2021     Hepatic Lab Results  Component Value Date   AST 57 (H) 06/01/2021   ALT 51 (H) 06/01/2021   ALBUMIN 3.0 (L) 06/01/2021   ALKPHOS 198 (H) 06/01/2021     ID Lab Results  Component Value Date   HIV Non Reactive 06/01/2021   SARSCOV2NAA NEGATIVE 06/01/2021   HCVAB NON REACTIVE 06/01/2021     Bone No results found for: "VD25OH", "VD125OH2TOT", "[id number]", "UK:060616", "25OHVITD1", "25OHVITD2", "25OHVITD3", "TESTOFREE", "TESTOSTERONE"   Endocrine Lab Results  Component Value Date   GLUCOSE 109 (H) 06/02/2021   GLUCOSEU NEGATIVE 06/01/2021   HGBA1C 6.4 (H) 06/01/2021   TSH 2.24 07/09/2020     Neuropathy Lab Results  Component Value Date   HGBA1C 6.4 (H) 06/01/2021   HIV Non Reactive 06/01/2021     CNS No results found for: "COLORCSF", "APPEARCSF", "RBCCOUNTCSF", "WBCCSF", "POLYSCSF", "LYMPHSCSF", "EOSCSF", "PROTEINCSF", "GLUCCSF", "JCVIRUS", "CSFOLI", "IGGCSF", "LABACHR", "ACETBL"   Inflammation (CRP: Acute  ESR: Chronic) Lab Results  Component Value Date   LATICACIDVEN 1.4 06/01/2021     Rheumatology No results found for: "RF", "ANA", "LABURIC", "URICUR", "  LYMEIGGIGMAB", "LYMEABIGMQN", "HLAB27"   Coagulation Lab Results  Component Value Date   INR 1.3 (H) 06/01/2021   LABPROT 16.4 (H) 06/01/2021   APTT 35 06/01/2021   PLT 367 06/02/2021     Cardiovascular Lab Results  Component Value Date   HGB 10.8 (L) 06/02/2021   HCT 33.7 (L) 06/02/2021     Screening Lab Results  Component Value Date   SARSCOV2NAA NEGATIVE 06/01/2021   HCVAB NON REACTIVE 06/01/2021   HIV Non Reactive 06/01/2021     Cancer No results found for: "CEA", "CA125", "LABCA2"   Allergens No results found for: "ALMOND", "APPLE", "ASPARAGUS", "AVOCADO", "BANANA",  "BARLEY", "BASIL", "BAYLEAF", "GREENBEAN", "LIMABEAN", "WHITEBEAN", "BEEFIGE", "REDBEET", "BLUEBERRY", "BROCCOLI", "CABBAGE", "MELON", "CARROT", "CASEIN", "CASHEWNUT", "CAULIFLOWER", "CELERY"     Note: Lab results reviewed.  [place]  Drug: Mr. Farino  reports current drug use. Frequency: 7.00 times per week. Drug: Marijuana. Alcohol:  reports current alcohol use of about 2.0 standard drinks of alcohol per week. Tobacco:  reports that he quit smoking about 2 years ago. His smoking use included cigarettes. He has a 13.50 pack-year smoking history. He has never used smokeless tobacco. Medical:  has a past medical history of Anxiety, Arthritis, Depression, Diabetes mellitus without complication ([place]), and Stroke ([place]). Family: family history includes Cancer in his father and paternal grandfather; Diabetes in his mother.  Past Surgical History:  Procedure Laterality Date   TIBIA FRACTURE SURGERY Left    TONSILLECTOMY     Active Ambulatory Problems    Diagnosis Date Noted   Laceration of right hand with complication 0000000   RA (rheumatoid arthritis) ([place]) 09/13/2015   Rheumatoid arthritis ([place]) 09/13/2015   Stroke ([place]) 06/01/2021   CAP (community acquired pneumonia) 06/01/2021   Hyponatremia 06/01/2021   Sepsis ([place]) 06/01/2021   Abnormal LFTs 06/01/2021   Diabetes mellitus without complication ([place]) [id number]   Anxiety and depression 06/01/2021   Weakness due to old stroke 05/09/2022   Alcohol abuse 05/09/2022   Marijuana smoker 05/09/2022   Chronic pain syndrome 05/09/2022   Resolved Ambulatory Problems    Diagnosis Date Noted   No Resolved Ambulatory Problems   Past Medical History:  Diagnosis Date   Anxiety    Arthritis    Depression    Constitutional Exam  General appearance: Well nourished, well developed, and well hydrated. In no apparent acute distress Vitals:   05/09/22 1031  BP: (!) 123/95  Pulse: 89  Resp: 18  Temp: (!) 97.2 F (36.2 C)  TempSrc: Temporal   SpO2: 97%  Weight: 181 lb 4.8 oz (82.2 kg)  Height: 5\' 7"  (1.702 m)   BMI Assessment: Estimated body mass index is 28.4 kg/m as calculated from the following:   Height as of this encounter: 5\' 7"  (1.702 m).   Weight as of this encounter: 181 lb 4.8 oz (82.2 kg).  BMI interpretation table: BMI level Category Range association with higher incidence of chronic pain  <18 kg/m2 Underweight   18.5-24.9 kg/m2 Ideal body weight   25-29.9 kg/m2 Overweight Increased incidence by 20%  30-34.9 kg/m2 Obese (Class I) Increased incidence by 68%  35-39.9 kg/m2 Severe obesity (Class II) Increased incidence by 136%  >40 kg/m2 Extreme obesity (Class III) Increased incidence by 254%   Patient's current BMI Ideal Body weight  Body mass index is 28.4 kg/m. Ideal body weight: 66.1 kg (145 lb 11.6 oz) Adjusted ideal body weight: 72.6 kg (159 lb 15.3 oz)   BMI Readings from Last 4 Encounters:  05/09/22 28.40 kg/m  07/07/21 25.85 kg/m  06/01/21 28.13 kg/m  02/16/21 30.56 kg/m   Wt Readings from Last 4 Encounters:  05/09/22 181 lb 4.8 oz (82.2 kg)  07/07/21 170 lb (77.1 kg)  06/01/21 185 lb (83.9 kg)  02/16/21 201 lb (91.2 kg)    Psych/Mental status: Alert, oriented x 3 (person, place, & time)       Eyes: PERLA Respiratory: No evidence of acute respiratory distress Left wrist in brace Diffuse arthralgias and myalgias Left foot paresthesias   Assessment  Primary Diagnosis & Pertinent Problem List: The primary encounter diagnosis was Anxiety and depression. Diagnoses of Rheumatoid arthritis of other site, unspecified whether rheumatoid factor present [facility]), Cerebrovascular accident (CVA), unspecified mechanism ([place]), Weakness due to old stroke, Alcohol abuse, Marijuana smoker, and Chronic pain syndrome were also pertinent to this visit.  Visit Diagnosis (New problems to examiner): 1. Anxiety and depression   2. Rheumatoid arthritis of other site, unspecified whether rheumatoid factor  present ([place])   3. Cerebrovascular accident (CVA), unspecified mechanism ([place])   4. Weakness due to old stroke   5. Alcohol abuse   6. Marijuana smoker   7. Chronic pain syndrome    Plan of Care   I discussed pain management strategies with Legrand Como.  I informed him that I focus on interventional pain management.  I defer medication management to his primary care provider or his rheumatologist.  I did offer him Qutenza for neuropathic pain of bilateral feet which could be related to painful diabetic neuropathy.  I also offered him alpha lipoic acid that could be used as an adjunct in addition to his gabapentin for neuropathic pain management.  I advised against any chronic opioid therapy given his history of alcohol abuse and current marijuana abuse.  I encouraged him to participate in home exercise program.  He states that he has already done physical therapy and did not get much out of it.  We discussed the importance of maintaining mobility and strength especially after CVA.  I also encouraged him to consider alternative pain management strategies including aquatic therapy, acupuncture, chiropractor.  We also discussed TENS unit.       Provider-requested follow-up: No follow-ups on file.  No future appointments.  Duration of encounter: 33minutes.  Total time on encounter, as per AMA guidelines included both the face-to-face and non-face-to-face time personally spent by the physician and/or other qualified health care professional(s) on the day of the encounter (includes time in activities that require the physician or other qualified health care professional and does not include time in activities normally performed by clinical staff). Physician's time may include the following activities when performed: Preparing to see the patient (e.g., pre-charting review of records, searching for previously ordered imaging, lab work, and nerve conduction tests) Review of prior analgesic  pharmacotherapies. Reviewing PMP Interpreting ordered tests (e.g., lab work, imaging, nerve conduction tests) Performing post-procedure evaluations, including interpretation of diagnostic procedures Obtaining and/or reviewing separately obtained history Performing a medically appropriate examination and/or evaluation Counseling and educating the patient/family/caregiver Ordering medications, tests, or procedures Referring and communicating with other health care professionals (when not separately reported) Documenting clinical information in the electronic or other health record Independently interpreting results (not separately reported) and communicating results to the patient/ family/caregiver Care coordination (not separately reported)  Note by: Gillis Santa, MD (TTS technology used. I apologize for any typographical errors that were not detected and corrected.) Date: 05/09/2022; Time: 1:17 PM

## 2022-05-09 NOTE — Patient Instructions (Signed)
Consider alpha lipoic acid 600 mg at night

## 2022-05-11 NOTE — Unmapped (Signed)
Rheumatology return visit    REASON FOR VISIT: F/U seropositive RA    HISTORY: Paul Klein Sr. is a 57 y.o. male with stroke, DM, RLS, HTN here for RA.  Last saw [facility] rheumatology in 2022 who recommended remicade but he never went back there. Established 01/12/22 with Dr. Berton Lan. Previously followed with [name]. MTX [place] and Simponi [place] monthly started at initial visit with Dr. Val Eagle.     Interim history:  Pt presents for f/u today. He was seen in [facility] ER 05/13/22 for slurred speech and reports having another stroke. He was going to be transferred to [facility] main to see neurology but pt instead went home. He reports symptoms as stable since then. No new neurological deficits. He hasn't seen or scheduled visit with Neurology yet. He is taking his plavix without missed doses.     He is taking MTX weekly and FA. He has had three doses of Simponi so far. He reports improvement in joints since restarting Simponia. He feels his hand swelling has improved. He still reports AM stiffness lasting a few hours. No recent fever, illness or infections.     Rheum history:  Previous rheum [name]. Was taking simponi and methotrexate at that time which worked pretty well. Enbrel, Humira and Orencia with no effect.  Also treated at 1 point with Harriette Ohara.  Simponi helped.     CURRENT MEDICATIONS:  Current Outpatient Medications   Medication Sig Dispense Refill    acetaminophen (TYLENOL) 500 MG tablet Take 1 tablet (500 mg total) by mouth every six (6) hours as needed for pain.      amitriptyline (ELAVIL) 25 MG tablet Take 1 tablet (25 mg total) by mouth nightly. 90 tablet 3    aspirin 81 MG chewable tablet Chew 1 tablet (81 mg total) daily. 30 tablet 3    atorvastatin (LIPITOR) 80 MG tablet Take 1 tablet (80 mg total) by mouth every evening. 180 tablet 0    blood sugar diagnostic (ONETOUCH ULTRA TEST) Strp by Other route daily before breakfast. 100 strip 1    carvedilol (COREG) 6.25 MG tablet Take 1 tablet (6.25 mg total) by mouth two (2) times a day. 180 tablet 0    cholecalciferol, vitamin D3-10 mcg, 400 unit,, 10 mcg (400 unit) Tab Take 5 tablets (50 mcg total) by mouth daily.      clopidogrel (PLAVIX) 75 mg tablet Take 1 tablet (75 mg total) by mouth daily. 90 tablet 0    cyanocobalamin/folic acid (VITAMIN B12-FOLIC ACID) 1,000-400 mcg Lozg Take 1,000 mcg by mouth daily with evening meal.      cyclobenzaprine (FLEXERIL) 10 MG tablet Take 1 tablet (10 mg total) by mouth Three (3) times a day as needed. 90 tablet 1    empty container Misc USE AS DIRECTED 1 each 2    ferrous sulfate 325 (65 FE) MG tablet Take 1 tablet (325 mg total) by mouth every other day. 15 tablet 11    folic acid (FOLVITE) 1 MG tablet Take 1 tablet (1 mg total) by mouth daily. (Patient taking differently: Take 800 mcg by mouth daily.) 30 tablet 6    gabapentin (NEURONTIN) 300 MG capsule TAKE 1 CAPSULE BY MOUTH THREE TIMES A DAY. 90 capsule 3    golimumab (SIMPONI) 50 mg/0.5 mL PnIj pen injector Inject the contents of 1 pen (50 mg total) under the skin every twenty-eight (28) days. 3 mL 3    ibuprofen (ADVIL) 200 MG tablet Take 1 tablet (200 mg  total) by mouth every six (6) hours as needed for pain.      melatonin 10 mg Chew Chew 10 mg nightly.      metFORMIN (GLUCOPHAGE) 500 MG tablet Take 1 tablet (500 mg total) by mouth in the morning and 1 tablet (500 mg total) in the evening. Take with meals. 180 tablet 1    methotrexate, Preservative Free, 25 mg/mL Soln vial/syringe INJECT 1 ML (25 MG TOTAL) UNDER THE SKIN ONCE A WEEK. 6 mL 3    nitroglycerin (NITROSTAT) 0.4 MG SL tablet Place 1 tablet (0.4 mg total) under the tongue every five (5) minutes as needed for chest pain. Maximum of 3 doses in 15 minutes. 25 tablet 0    ondansetron (ZOFRAN) 4 MG tablet Take 1 tablet (4 mg total) by mouth every twelve (12) hours as needed for nausea.      syringe with needle 1 mL 25 gauge x 5/8 Syrg For use with injectable methotrexate 50 each 0    traMADoL (ULTRAM) 50 mg tablet Take 1 tablet (50 mg total) by mouth every twelve (12) hours as needed for pain. 60 tablet 3     No current facility-administered medications for this visit.       Past Medical History:   Diagnosis Date    Diabetes mellitus (CMS-HCC)     Rheumatoid arthritis(714.0)     Stroke (CMS-HCC)         Record Review: Available records were reviewed, including pertinent office visits, labs, and imaging.      REVIEW OF SYSTEMS: Ten system were reviewed and negative except as noted above.    PHYSICAL EXAM:  VITAL SIGNS:   Vitals:    05/16/22 0945   BP: 104/77   Pulse: 72   Temp: 36.8 ??C (98.2 ??F)   Weight: 81.6 kg (180 lb)   Height: 170.2 cm (5' 7)     General:   Pleasant 57 y.o.male tearful at times, accompanied by his wife.    Lymph:   No masses or cervical lymphadenopathy.    Cardiovascular:  Regular rate and rhythm. No murmur, rub, or gallop. No lower extremity edema.    Lungs:  Clear to auscultation.Normal respiratory effort.    Musculoskeletal:   General: Ambulates w/o assistance   Hands: Mild tenderness to R 2nd and 4th MCP w/o overt swelling. Difficult to exxam L hand due to brace.   Wrists:FROM w/o swelling or tenderness   Elbows: FROM w/o swelling or tenderness   Knees: FROM w/o effusions   Feet: No pain with MTP squeeze    Skin:  No rashes.     Swollen Joint Count (0-28): 0  Tender Joint Count (0-28): 2  Patient Global Assessment of Disease Activity (0-10): 7  Evaluator Global Assessment of Disease (0-10): 5  CDAI Score: 14  CDAI interpretation:  0.0-2.8 Remission   2.9-10.0 Low disease activity   10.1-22.0 Moderate disease activity   22.1-76.0 High disease activity         ASSESSMENT/PLAN:    Seropositive RA- improved with restarting Simponi.   - Continue MTX 25mg  [place] weekly and FA daily.   - Continue Simponi [place] monthly.   - Recent CBCw/diff, Cr and LFTs from 05/13/22 reviewed and overall stable. Mild increase in AST.   - Update monitoring labs in 3 months at [facility]. Orders placed.     Recent stroke- was seen in ER 05/13/22 for slurred speech was discharged with plan to f/u with Neurology but pt hasn't yet. Reports  h/o stroke 04/2021, currently on plavix.   - Strongly advised pt to contact Neurology and schedule visit. Provided with clinic number to schedule.   - Instructed pt to go to ER if symptoms worsens or develops new symptoms.     HCM:   - PCV20 Status:11/14/21  - PPSV 23 Status:07/27/2006  - COVID-19 vaccine status: didn't discuss today  - Annual Influenza vaccine. Status: 11/14/21  - Bone health: not on prednisone     F/U as scheduled in July with Dr. Berton Lan     I personally spent 35 minutes face-to-face and non-face-to-face in the care of this patient, which includes all pre, intra, and post visit time on the date of service.  All documented time was specific to the E/M visit and does not include any procedures that may have been performed.

## 2022-05-13 ENCOUNTER — Emergency Department: Admit: 2022-05-13 | Discharge: 2022-05-14 | Disposition: A | Discharge status: Home / Self Care | Payer: MEDICARE

## 2022-05-13 ENCOUNTER — Ambulatory Visit: Admit: 2022-05-13 | Discharge: 2022-05-14 | Disposition: A | Discharge status: Home / Self Care | Payer: MEDICARE

## 2022-05-13 DIAGNOSIS — Z888 Allergy status to other drugs, medicaments and biological substances status: Secondary | ICD-10-CM | POA: Diagnosis not present

## 2022-05-13 DIAGNOSIS — R519 Headache, unspecified: Secondary | ICD-10-CM | POA: Diagnosis not present

## 2022-05-13 DIAGNOSIS — I69392 Facial weakness following cerebral infarction: Secondary | ICD-10-CM | POA: Diagnosis not present

## 2022-05-13 DIAGNOSIS — I69354 Hemiplegia and hemiparesis following cerebral infarction affecting left non-dominant side: Secondary | ICD-10-CM | POA: Diagnosis not present

## 2022-05-13 DIAGNOSIS — R4781 Slurred speech: Secondary | ICD-10-CM | POA: Diagnosis not present

## 2022-05-13 DIAGNOSIS — R9431 Abnormal electrocardiogram [ECG] [EKG]: Secondary | ICD-10-CM | POA: Diagnosis not present

## 2022-05-13 DIAGNOSIS — Z79899 Other long term (current) drug therapy: Secondary | ICD-10-CM | POA: Diagnosis not present

## 2022-05-13 DIAGNOSIS — H538 Other visual disturbances: Secondary | ICD-10-CM | POA: Diagnosis not present

## 2022-05-13 DIAGNOSIS — M069 Rheumatoid arthritis, unspecified: Secondary | ICD-10-CM | POA: Diagnosis not present

## 2022-05-13 DIAGNOSIS — Z5181 Encounter for therapeutic drug level monitoring: Secondary | ICD-10-CM | POA: Diagnosis not present

## 2022-05-13 DIAGNOSIS — Z87891 Personal history of nicotine dependence: Secondary | ICD-10-CM | POA: Diagnosis not present

## 2022-05-13 DIAGNOSIS — Z7984 Long term (current) use of oral hypoglycemic drugs: Secondary | ICD-10-CM | POA: Diagnosis not present

## 2022-05-13 DIAGNOSIS — E119 Type 2 diabetes mellitus without complications: Secondary | ICD-10-CM | POA: Diagnosis not present

## 2022-05-13 DIAGNOSIS — Z7902 Long term (current) use of antithrombotics/antiplatelets: Secondary | ICD-10-CM | POA: Diagnosis not present

## 2022-05-13 LAB — CBC W/ AUTO DIFF
BASOPHILS ABSOLUTE COUNT: 0.1 10*9/L (ref 0.0–0.1)
BASOPHILS RELATIVE PERCENT: 0.6 %
EOSINOPHILS ABSOLUTE COUNT: 0.2 10*9/L (ref 0.0–0.5)
EOSINOPHILS RELATIVE PERCENT: 1.8 %
HEMATOCRIT: 47.1 % (ref 39.0–48.0)
HEMOGLOBIN: 16.2 g/dL (ref 12.9–16.5)
LYMPHOCYTES ABSOLUTE COUNT: 3.2 10*9/L (ref 1.1–3.6)
LYMPHOCYTES RELATIVE PERCENT: 32.4 %
MEAN CORPUSCULAR HEMOGLOBIN CONC: 34.5 g/dL (ref 32.0–36.0)
MEAN CORPUSCULAR HEMOGLOBIN: 31.4 pg (ref 25.9–32.4)
MEAN CORPUSCULAR VOLUME: 91 fL (ref 77.6–95.7)
MEAN PLATELET VOLUME: 7.7 fL (ref 6.8–10.7)
MONOCYTES ABSOLUTE COUNT: 0.7 10*9/L (ref 0.3–0.8)
MONOCYTES RELATIVE PERCENT: 7 %
NEUTROPHILS ABSOLUTE COUNT: 5.8 10*9/L (ref 1.8–7.8)
NEUTROPHILS RELATIVE PERCENT: 58.2 %
NUCLEATED RED BLOOD CELLS: 0 /100{WBCs} (ref <=4)
PLATELET COUNT: 250 10*9/L (ref 150–450)
RED BLOOD CELL COUNT: 5.17 10*12/L (ref 4.26–5.60)
RED CELL DISTRIBUTION WIDTH: 15.5 % — ABNORMAL HIGH (ref 12.2–15.2)
WBC ADJUSTED: 9.9 10*9/L (ref 3.6–11.2)

## 2022-05-13 LAB — COMPREHENSIVE METABOLIC PANEL
ALBUMIN: 4 g/dL (ref 3.4–5.0)
ALKALINE PHOSPHATASE: 119 U/L — ABNORMAL HIGH (ref 46–116)
ALT (SGPT): 13 U/L (ref 10–49)
ANION GAP: 7 mmol/L (ref 5–14)
AST (SGOT): 39 U/L — ABNORMAL HIGH (ref <=34)
BILIRUBIN TOTAL: 0.6 mg/dL (ref 0.3–1.2)
CALCIUM: 9.7 mg/dL (ref 8.7–10.4)
CHLORIDE: 105 mmol/L (ref 98–107)
CO2: 24.1 mmol/L (ref 20.0–31.0)
GLUCOSE RANDOM: 150 mg/dL (ref 70–179)
PROTEIN TOTAL: 8.2 g/dL (ref 5.7–8.2)
SODIUM: 136 mmol/L (ref 135–145)

## 2022-05-13 LAB — TOXICOLOGY SCREEN, URINE
AMPHETAMINE SCREEN URINE: NEGATIVE
BARBITURATE SCREEN URINE: NEGATIVE
BENZODIAZEPINE SCREEN, URINE: NEGATIVE
BUPRENORPHINE, URINE SCREEN: NEGATIVE
CANNABINOID SCREEN URINE: POSITIVE — AB
COCAINE(METAB.)SCREEN, URINE: NEGATIVE
FENTANYL SCREEN, URINE: NEGATIVE
METHADONE SCREEN, URINE: NEGATIVE
OPIATE SCREEN URINE: NEGATIVE
OXYCODONE SCREEN URINE: NEGATIVE

## 2022-05-13 LAB — POTASSIUM: POTASSIUM: 4 mmol/L (ref 3.4–4.8)

## 2022-05-13 LAB — CREATININE
CREATININE: 0.67 mg/dL — ABNORMAL LOW
EGFR CKD-EPI (2021) MALE: >90 mL/min/{1.73_m2} (ref >=60)

## 2022-05-13 LAB — ETHANOL: ETHANOL: <10 mg/dL (ref <=10)

## 2022-05-13 LAB — URINALYSIS WITH MICROSCOPY WITH CULTURE REFLEX
BACTERIA: NONE SEEN /HPF
BILIRUBIN UA: NEGATIVE
BLOOD UA: NEGATIVE
GLUCOSE UA: NEGATIVE
KETONES UA: NEGATIVE
LEUKOCYTE ESTERASE UA: NEGATIVE
NITRITE UA: NEGATIVE
PH UA: 6.5 (ref 5.0–9.0)
PROTEIN UA: NEGATIVE
RBC UA: <1 /HPF (ref <=3)
SPECIFIC GRAVITY UA: 1.012 (ref 1.003–1.030)
SQUAMOUS EPITHELIAL: <1 /HPF (ref 0–5)
UROBILINOGEN UA: <2
WBC UA: <1 /HPF (ref <=2)

## 2022-05-13 LAB — BUN: BLOOD UREA NITROGEN: 6 mg/dL — ABNORMAL LOW (ref 9–23)

## 2022-05-13 LAB — PROTIME-INR
INR: 1.1
PROTIME: 12.2 s (ref 9.9–12.6)

## 2022-05-13 LAB — TSH: THYROID STIMULATING HORMONE: 1.305 u[IU]/mL (ref 0.550–4.780)

## 2022-05-13 LAB — HIGH SENSITIVITY TROPONIN I - SINGLE: HIGH SENSITIVITY TROPONIN I: <3 ng/L (ref <=53)

## 2022-05-13 MED ADMIN — folic acid injection: 1 mg | INTRAVENOUS | @ 23:00:00 | Stop: 2022-05-13

## 2022-05-13 MED ADMIN — thiamine (B-1) injection 100 mg: 100 mg | INTRAVENOUS | @ 23:00:00 | Stop: 2022-05-13

## 2022-05-13 MED ADMIN — iohexol (OMNIPAQUE) 350 mg iodine/mL solution 75 mL: 75 mL | INTRAVENOUS | @ 23:00:00 | Stop: 2022-05-13

## 2022-05-13 MED ADMIN — sodium chloride 0.9% (NS) bolus 500 mL: 500 mL | INTRAVENOUS | @ 22:00:00 | Stop: 2022-05-13

## 2022-05-13 NOTE — Unmapped (Signed)
Patient presents here with c/o headache and slurred speech since  1 am this morning. Has left sided weakness from the previous stroke

## 2022-05-13 NOTE — Unmapped (Signed)
[facility]  Emergency Department Provider Note     ED Clinical Impression     Final diagnoses:   Slurred speech (Primary)   History of CVA (cerebrovascular accident)      Impression, Medical Decision Making, ED Course     Impression: 57 y.o. male who has a past medical history of Diabetes mellitus (CMS-HCC), Rheumatoid arthritis(714.0), and Stroke (CMS-HCC). who presents with slurred speech and headaches that began around 1 AM last night as described below.     On exam, patient is alert and in no acute distress. Vital signs remarkable for tachycardia to 109 BPM, otherwise within normal limits. Physical exam revealed cardiac and pulmonary exam are unremarkable.  Abdomen was soft, nontender, nondistended.  Neurological exam he is alert and oriented x 4.  Ocular movement is intact, peripheral vision intact no neglect.  He has residual left-sided facial droop, slurring his words.  Right upper extremity is 5 out of 5, left upper extremity is 3 out of 5 which is residual.  Sensation is intact on both arms bilaterally.  Finger-to-nose on right without dysmetria with finger-nose on left challenging to assess.  Length is 5 out of 5 on right lower extremity, 4+ out of 5 on left lower extremity.  Patient is able to ambulate comparable to his baseline.    DDx/MDM: Differential diagnosis includes but is not limited to stroke.  Patient has had multiple CVAs in the past, he states that he has not missed any of his medications.  Also considered reactivation of previous stroke.  Patient with history of substance use could be acute intoxication, EtOH.  Also consider metabolic causes, Bell's palsy.    Diagnostic workup as below. Will treat patient with thiamine, folate, IV hydration.    CBC without elevated white blood count, negative for anemia.  TSH within normal limits.  EtOH undetectable, intoxication less likely to be contributing factor.  Troponin undetectable which is reassuring.  CBC without gross electrolyte abnormalities normal kidney function.  UA without any evidence of infection.  Chest x-ray negative for focal consolidation, pneumothorax, pleural effusion.  CTA head and neck with and without contrast with no acute abnormality.  There are chronic infarcts of the right corona radiata and basal ganglia.  All major cerebral arteries are patent.  There is an occluded proximal right vertebral with reconstitution at the distal V2 segment, unlikely to be contributory as there is reconstitution of flow.     On reevaluation patient continues to have word slurring states he feels at his baseline otherwise.  Spoke to neurology at [facility], agreed to evaluate patient in the emergency department at [facility].     I went and recommended patient transfer to [facility] for neurology evaluation.  He declined and states that he would much rather prefer to go home.  I explained risk including serious morbidity and death however he continued to decline transfer.  He states that if symptoms get worse in any way, he has new symptoms he will present to the emergency department.  Patient has decision-making capacity.  Patient discharged with strict return precautions.    .    Orders Placed This Encounter   Procedures    CTA Head W Wo Contrast    CTA Neck W Wo Contrast (Extracranial vessels)    XR Chest 2 views    CBC w/ Differential    Comprehensive metabolic panel    hsTroponin I (single, no delta)    TSH    Urinalysis with  Microscopy with Culture Reflex    Ethanol,Blood    Protime-INR    Toxicology screen, urine    Potassium Level    BUN    Creatinine    ECG 12 Lead            MDM Elements  Discussion of Management with other Physicians, QHP or Appropriate Source: Consultant - neurology, [place] [facility]  Independent Interpretation of Studies: EKG(s) - rate of 99, normal axis, normal sinus rhythm, no ST elevation or depression., X-ray(s) - no focal consolidation, pneumothorax, pleural effusion, and CT scan(s) - no evidence of intracranial hemorrhage, intracranial vessels are patent, occluded proximal right vertebral artery with reconstitution of flow.  I have reviewed recent and relevant previous record, including: Outpatient notes - 04/11/2022 Family Medicine Office Visit: Reviewed patient's medical history - history of CVA with left sided deficits.  Escalation of Care including OBS/Admission/Transfer was considered: Admission discussed. Patient/family declined.  Social Determinants that significantly affected care: None    ____________________________________________    The case was discussed with the attending physician, who is in agreement with the above assessment and plan.      History     Chief Complaint  Chief Complaint   Patient presents with    Slurred Speech       HPI   Paul Fitzner Sr. is a 57 y.o. male with past medical history as below who presents with slurred speech. The patient reports around 1 AM last night before going to bed he developed slurred speech and a headache. Prior to the onset of his symptoms he was watching TV with his friends around 53 PM, and remembers he felt more fatigued than usual. Additionally, he now reports his eyes feel like they are twitching and endorses blurred vision. His headache has now largely resolved. He has a history of a prior CVA in April 2023 (right thalamic infarct 06/01/2021 and right basal ganglia hemorrhage 06/08/2021) with residual left sided weakness and facial droop. His current left sided weakness is at baseline, but he states his facial droop is slightly worse than normal. Her endorses drinking 1-2 beers daily and intermittent marijuana use.      Outside Historian(s): None    Past Medical History:   Diagnosis Date    Diabetes mellitus (CMS-HCC)     Rheumatoid arthritis(714.0)     Stroke (CMS-HCC)        No past surgical history on file.    No current facility-administered medications for this encounter.    Current Outpatient Medications:     acetaminophen (TYLENOL) 500 MG tablet, Take 1 tablet (500 mg total) by mouth every six (6) hours as needed for pain., Disp: , Rfl:     amitriptyline (ELAVIL) 25 MG tablet, Take 1 tablet (25 mg total) by mouth nightly., Disp: 90 tablet, Rfl: 3    aspirin 81 MG chewable tablet, Chew 1 tablet (81 mg total) daily., Disp: 30 tablet, Rfl: 3    atorvastatin (LIPITOR) 80 MG tablet, Take 1 tablet (80 mg total) by mouth every evening., Disp: 180 tablet, Rfl: 0    blood sugar diagnostic (ONETOUCH ULTRA TEST) Strp, by Other route daily before breakfast., Disp: 100 strip, Rfl: 1    carvedilol (COREG) 6.25 MG tablet, Take 1 tablet (6.25 mg total) by mouth two (2) times a day., Disp: 180 tablet, Rfl: 0    cholecalciferol, vitamin D3-10 mcg, 400 unit,, 10 mcg (400 unit) Tab, Take 5 tablets (50 mcg total) by mouth daily., Disp: , Rfl:  clopidogrel (PLAVIX) 75 mg tablet, Take 1 tablet (75 mg total) by mouth daily., Disp: 90 tablet, Rfl: 0    cyanocobalamin/folic acid (VITAMIN B12-FOLIC ACID) 1,000-400 mcg Lozg, Take 1,000 mcg by mouth daily with evening meal., Disp: , Rfl:     cyclobenzaprine (FLEXERIL) 10 MG tablet, Take 1 tablet (10 mg total) by mouth Three (3) times a day as needed., Disp: 90 tablet, Rfl: 1    empty container Misc, USE AS DIRECTED, Disp: 1 each, Rfl: 2    ferrous sulfate 325 (65 FE) MG tablet, Take 1 tablet (325 mg total) by mouth every other day., Disp: 15 tablet, Rfl: 11    folic acid (FOLVITE) 1 MG tablet, Take 1 tablet (1 mg total) by mouth daily. (Patient taking differently: Take 800 mcg by mouth daily.), Disp: 30 tablet, Rfl: 6    gabapentin (NEURONTIN) 300 MG capsule, TAKE 1 CAPSULE BY MOUTH THREE TIMES A DAY., Disp: 90 capsule, Rfl: 3    golimumab (SIMPONI) 50 mg/0.5 mL PnIj pen injector, Inject the contents of 1 pen (50 mg total) under the skin every twenty-eight (28) days., Disp: 3 mL, Rfl: 3    ibuprofen (ADVIL) 200 MG tablet, Take 1 tablet (200 mg total) by mouth every six (6) hours as needed for pain., Disp: , Rfl:     melatonin 10 mg Chew, Chew 10 mg nightly., Disp: , Rfl:     metFORMIN (GLUCOPHAGE) 500 MG tablet, Take 1 tablet (500 mg total) by mouth in the morning and 1 tablet (500 mg total) in the evening. Take with meals., Disp: 180 tablet, Rfl: 1    methotrexate, Preservative Free, 25 mg/mL Soln vial/syringe, INJECT 1 ML (25 MG TOTAL) UNDER THE SKIN ONCE A WEEK., Disp: 6 mL, Rfl: 3    nitroglycerin (NITROSTAT) 0.4 MG SL tablet, Place 1 tablet (0.4 mg total) under the tongue every five (5) minutes as needed for chest pain. Maximum of 3 doses in 15 minutes., Disp: 25 tablet, Rfl: 0    ondansetron (ZOFRAN) 4 MG tablet, Take 1 tablet (4 mg total) by mouth every twelve (12) hours as needed for nausea., Disp: , Rfl:     syringe with needle 1 mL 25 gauge x 5/8 Syrg, For use with injectable methotrexate, Disp: 50 each, Rfl: 0    traMADoL (ULTRAM) 50 mg tablet, Take 1 tablet (50 mg total) by mouth every twelve (12) hours as needed for pain., Disp: 60 tablet, Rfl: 3    Allergies  Seroquel [quetiapine]    Family History  No family history on file.    Social History  Social History     Tobacco Use    Smoking status: Former     Current packs/day: 0.00     Types: Cigarettes     Quit date: 04/29/2020     Years since quitting: 2.0    Smokeless tobacco: Never        Physical Exam     VITAL SIGNS:      Vitals:    05/13/22 1900 05/13/22 2000 05/13/22 2100 05/13/22 2200   BP: 120/79 130/88 122/66 118/84   Pulse: 91 91 84 83   Resp: 17 21 11 19    Temp:       TempSrc:       SpO2: 95% 96% 98% 95%   Weight:           Constitutional: Alert and oriented. No acute distress.  Eyes: Conjunctivae are normal.  HEENT: Normocephalic and atraumatic. Conjunctivae clear.  No congestion. Moist mucous membranes.   Cardiovascular: Rate as above, regular rhythm. Normal and symmetric distal pulses. Brisk capillary refill. Normal skin turgor.  Respiratory: Normal respiratory effort. Breath sounds are normal. There are no wheezing or crackles heard.  Gastrointestinal: Soft, non-distended, non-tender.  Genitourinary: Deferred.  Musculoskeletal: Non-tender with normal range of motion in all extremities.  Neurologic: See MDM  Skin: Skin is warm, dry and intact. No rash noted.  Psychiatric: Mood and affect are normal. Speech and behavior are normal.     Radiology     CTA Head W Wo Contrast   Final Result   No acute intracranial abnormality. Chronic infarct in the right corona radiata and basal ganglia.      All major intracranial cerebral arteries are patent.      Occluded proximal right vertebral artery with reconstitution of flow in the distal V2 segment.                              CTA Neck W Wo Contrast (Extracranial vessels)   Final Result   No acute intracranial abnormality. Chronic infarct in the right corona radiata and basal ganglia.      All major intracranial cerebral arteries are patent.      Occluded proximal right vertebral artery with reconstitution of flow in the distal V2 segment.                              XR Chest 2 views   Final Result      No acute abnormality.          Pertinent labs & imaging results that were available during my care of the patient were independently interpreted by me and considered in my medical decision making (see chart for details).    Portions of this record have been created using Scientist, clinical (histocompatibility and immunogenetics). Dictation errors have been sought, but may not have been identified and corrected.    Documentation assistance was provided by Nadene Rubins, Scribe, on May 13, 2022 at 4:10 PM for Nena Polio, MD.  Documentation assistance was provided by the scribe in my presence.  The documentation recorded by the scribe has been reviewed by me and accurately reflects the services I personally performed.           Lucretia Roers, MD  Resident  05/15/22 [phone]

## 2022-05-14 NOTE — Unmapped (Signed)
Emergency Medicine Access Physician [facility]): [facility] Transfer Request Note    Requesting Provider: Kahi Mohala: [facility]    Requesting Service: Emergency Medicine    Reason for transfer: Neurology    Overview of ED course at transferring hospital: Zeik Loeb. is a 57 y.o. male with a h/o multiple CVAs who p/w new slurred speech. CT normal. LKN 9pm yesterday (24 hours ago). No other new deficits but he has baseline L sided weakness and known facial droop. Pt thinks the facial droop is worse but other strength is worse.   Possible new CVA vs Bell's palsy.   Dr. Raenette Rover with neurology was on the call.   Plan for ED to ED transfer and neuro consult.    Was the patient accepted to the [facility] ED in transfer: Yes.  If no, why?: N/A    Accepting service/expected service information:  Has an inpatient or consulting service been contacted by the [facility] or the referring provider?: Yes.  If admitting/consult service contacted by [facility]:  Time of discussion: EMAP conferenced with the inpatient/consulting provider prior to acceptance  Name/service (e.g. Dr. Floyde Parkins, neprology) of inpatient team member/consultant:  Dr. Raenette Rover, neurology  Role of admit/consult service member: Attending  Brief summary of call: N/A  Anticipated Inpatient Bed Type Needed: To be determined    Outside Hospital Imaging:  Was Imaging Done at the Referring Hospital:  Yes.  If yes, what studies?:  CT head  [employer]:   y

## 2022-05-15 NOTE — Unmapped (Signed)
Upcoming Appt:  Future Appointments   Date Time Provider Department Center   05/16/2022  9:30 AM Rockwell Alexandria, FNP [facility]   08/07/2022 11:00 AM Mangel, Benison Pap, DO [facility]   09/12/2022  9:10 AM Malvin Johns, DO [facility]     Verified appt date/time from Epic with caller. Also has PCP appt.    Recent:   What is the date of your last related visit?  Patient is the caller 3 days ago. He has a speech impairment from the stroke. Needs to speak to his doctor regarding his medications and need for adjustment.   Related acute medications Rx'd:  no new medications   Home treatment tried:  nothing       Relevant:   Allergies: Seroquel [quetiapine]  Medications: Elavil, Plavix, Metformin   Health History: Diabetes, Stroke   Weight: NA       [place] [facility] patients only:  What was the date of your last cancer treatment (mm/dd/yy)?: NA   Was the treatment oral or infusion?: NA   Are you currently on TVEC (yes/no)?: NA   Reason for Disposition   [1] SEVERE weakness (i.e., unable to walk or barely able to walk, requires support) AND [2] new-onset or worsening    Answer Assessment - Initial Assessment Questions  1. SYMPTOM: What is the main symptom you are concerned about? (e.g., weakness, numbness)      Changes in his speech, he has weakness of his left leg   2. ONSET: When did this start? (minutes, hours, days; while sleeping)      Started 5 days ago about 05/10/22    3. LAST NORMAL: When was the last time you (the patient) were normal (no symptoms)?      05/09/22   4. PATTERN Does this come and go, or has it been constant since it started?  Is it present now?      Constant since 05/09/22   5. CARDIAC SYMPTOMS: Have you had any of the following symptoms: chest pain, difficulty breathing, palpitations?      Denies chest pain, denies difficulty breathing, denies palpitations   6. NEUROLOGIC SYMPTOMS: Have you had any of the following symptoms: headache, dizziness, vision loss, double vision, changes in speech, unsteady on your feet?      He has had a headache for 5 days, denies dizziness, difficulty seeing numbers, does have speech changes and is hard to understand. He is in a wheelchair and is very unsteady on his feet.   7. OTHER SYMPTOMS: Do you have any other symptoms?      Denies   8. PREGNANCY: Is there any chance you are pregnant? When was your last menstrual period?      NA    Protocols used: Neurologic Deficit-A-AH

## 2022-05-16 ENCOUNTER — Ambulatory Visit: Admit: 2022-05-16 | Discharge: 2022-05-17 | Payer: MEDICARE | Attending: Family | Primary: Family

## 2022-05-16 DIAGNOSIS — Z79899 Other long term (current) drug therapy: Principal | ICD-10-CM

## 2022-05-16 NOTE — Unmapped (Addendum)
Your provider today was Brennan Bailey, NP    Thank you for letting us be involved with your care!    Today we discussed the following:    Continue methotrexate, folic acid and Simponi injections. Remember to labs in 3 months at [facility].     Please call Neurology at [phone] to schedule a visit.     It can take 10-14 days for all of the test results to come back. I will send you a MyChart message when they are complete.   If you have non-urgent questions, the best way to contact us is to send a MyChart message by visiting [email or web address].  You can also use MyChart to request refills and access test results.  I will do my best to respond within 2 business days, however occasionally it may take longer. If you have immediate concerns, please contact our clinic by phone [phone].

## 2022-05-18 MED FILL — SIMPONI 50 MG/0.5 ML SUBCUTANEOUS PEN INJECTOR: SUBCUTANEOUS | 84 days supply | Qty: 1.5 | Fill #2

## 2022-05-19 ENCOUNTER — Emergency Department: Admit: 2022-05-19 | Discharge: 2022-05-20 | Disposition: A | Discharge status: Home / Self Care | Payer: MEDICARE

## 2022-05-19 ENCOUNTER — Ambulatory Visit: Admit: 2022-05-19 | Discharge: 2022-05-20 | Disposition: A | Discharge status: Home / Self Care | Payer: MEDICARE

## 2022-05-19 DIAGNOSIS — Z7982 Long term (current) use of aspirin: Secondary | ICD-10-CM | POA: Diagnosis not present

## 2022-05-19 DIAGNOSIS — Z791 Long term (current) use of non-steroidal anti-inflammatories (NSAID): Secondary | ICD-10-CM | POA: Diagnosis not present

## 2022-05-19 DIAGNOSIS — R4781 Slurred speech: Secondary | ICD-10-CM | POA: Diagnosis not present

## 2022-05-19 DIAGNOSIS — R1310 Dysphagia, unspecified: Secondary | ICD-10-CM | POA: Diagnosis not present

## 2022-05-19 DIAGNOSIS — G9389 Other specified disorders of brain: Secondary | ICD-10-CM | POA: Diagnosis not present

## 2022-05-19 DIAGNOSIS — E119 Type 2 diabetes mellitus without complications: Secondary | ICD-10-CM | POA: Diagnosis not present

## 2022-05-19 DIAGNOSIS — Z7984 Long term (current) use of oral hypoglycemic drugs: Secondary | ICD-10-CM | POA: Diagnosis not present

## 2022-05-19 DIAGNOSIS — I639 Cerebral infarction, unspecified: Secondary | ICD-10-CM | POA: Diagnosis not present

## 2022-05-19 DIAGNOSIS — Z7902 Long term (current) use of antithrombotics/antiplatelets: Secondary | ICD-10-CM | POA: Diagnosis not present

## 2022-05-19 DIAGNOSIS — I610 Nontraumatic intracerebral hemorrhage in hemisphere, subcortical: Secondary | ICD-10-CM | POA: Diagnosis not present

## 2022-05-19 DIAGNOSIS — Z79899 Other long term (current) drug therapy: Secondary | ICD-10-CM | POA: Diagnosis not present

## 2022-05-19 DIAGNOSIS — R918 Other nonspecific abnormal finding of lung field: Secondary | ICD-10-CM | POA: Diagnosis not present

## 2022-05-19 LAB — TOXICOLOGY SCREEN, URINE
AMPHETAMINE SCREEN URINE: NEGATIVE
BARBITURATE SCREEN URINE: NEGATIVE
BENZODIAZEPINE SCREEN, URINE: NEGATIVE
BUPRENORPHINE, URINE SCREEN: NEGATIVE
CANNABINOID SCREEN URINE: POSITIVE — AB
COCAINE(METAB.)SCREEN, URINE: NEGATIVE
FENTANYL SCREEN, URINE: NEGATIVE
METHADONE SCREEN, URINE: NEGATIVE
OPIATE SCREEN URINE: NEGATIVE
OXYCODONE SCREEN URINE: NEGATIVE

## 2022-05-19 LAB — CBC W/ AUTO DIFF
BASOPHILS ABSOLUTE COUNT: 0.1 10*9/L (ref 0.0–0.1)
BASOPHILS RELATIVE PERCENT: 0.7 %
EOSINOPHILS ABSOLUTE COUNT: 0.1 10*9/L (ref 0.0–0.5)
EOSINOPHILS RELATIVE PERCENT: 1.6 %
HEMATOCRIT: 46.8 % (ref 39.0–48.0)
HEMOGLOBIN: 16.1 g/dL (ref 12.9–16.5)
LYMPHOCYTES ABSOLUTE COUNT: 2.3 10*9/L (ref 1.1–3.6)
LYMPHOCYTES RELATIVE PERCENT: 27.2 %
MEAN CORPUSCULAR HEMOGLOBIN CONC: 34.3 g/dL (ref 32.0–36.0)
MEAN CORPUSCULAR HEMOGLOBIN: 31 pg (ref 25.9–32.4)
MEAN CORPUSCULAR VOLUME: 90.3 fL (ref 77.6–95.7)
MEAN PLATELET VOLUME: 8.1 fL (ref 6.8–10.7)
MONOCYTES ABSOLUTE COUNT: 0.6 10*9/L (ref 0.3–0.8)
MONOCYTES RELATIVE PERCENT: 6.9 %
NEUTROPHILS ABSOLUTE COUNT: 5.4 10*9/L (ref 1.8–7.8)
NEUTROPHILS RELATIVE PERCENT: 63.6 %
NUCLEATED RED BLOOD CELLS: 0 /100{WBCs} (ref <=4)
PLATELET COUNT: 233 10*9/L (ref 150–450)
RED BLOOD CELL COUNT: 5.18 10*12/L (ref 4.26–5.60)
RED CELL DISTRIBUTION WIDTH: 14.4 % (ref 12.2–15.2)
WBC ADJUSTED: 8.5 10*9/L (ref 3.6–11.2)

## 2022-05-19 LAB — COMPREHENSIVE METABOLIC PANEL
ALBUMIN: 3.9 g/dL (ref 3.4–5.0)
ALKALINE PHOSPHATASE: 132 U/L — ABNORMAL HIGH (ref 46–116)
ALT (SGPT): 9 U/L — ABNORMAL LOW (ref 10–49)
ANION GAP: 4 mmol/L — ABNORMAL LOW (ref 5–14)
AST (SGOT): 17 U/L (ref <=34)
BILIRUBIN TOTAL: 1.2 mg/dL (ref 0.3–1.2)
BLOOD UREA NITROGEN: 7 mg/dL — ABNORMAL LOW (ref 9–23)
BUN / CREAT RATIO: 11
CALCIUM: 9.8 mg/dL (ref 8.7–10.4)
CHLORIDE: 104 mmol/L (ref 98–107)
CO2: 27.2 mmol/L (ref 20.0–31.0)
CREATININE: 0.66 mg/dL — ABNORMAL LOW
EGFR CKD-EPI (2021) MALE: >90 mL/min/{1.73_m2} (ref >=60)
GLUCOSE RANDOM: 107 mg/dL (ref 70–179)
POTASSIUM: 4.4 mmol/L (ref 3.4–4.8)
PROTEIN TOTAL: 7.8 g/dL (ref 5.7–8.2)
SODIUM: 135 mmol/L (ref 135–145)

## 2022-05-19 LAB — LIPID PANEL
CHOLESTEROL/HDL RATIO SCREEN: 4 (ref 1.0–4.5)
CHOLESTEROL: 116 mg/dL (ref <=200)
HDL CHOLESTEROL: 29 mg/dL — ABNORMAL LOW (ref 40–60)
LDL CHOLESTEROL CALCULATED: 61 mg/dL (ref 40–99)
NON-HDL CHOLESTEROL: 87 mg/dL (ref 70–130)
TRIGLYCERIDES: 130 mg/dL (ref 0–150)
VLDL CHOLESTEROL CAL: 26 mg/dL (ref 12–47)

## 2022-05-19 LAB — ETHANOL: ETHANOL: <10 mg/dL (ref <=10)

## 2022-05-19 LAB — HIGH SENSITIVITY TROPONIN I - SINGLE: HIGH SENSITIVITY TROPONIN I: <3 ng/L (ref <=53)

## 2022-05-19 MED ADMIN — gadobenate dimeglumine (MULTIHANCE) 529 mg/mL (0.1mmol/0.2mL) solution 15 mL: 15 mL | INTRAVENOUS | @ 18:00:00 | Stop: 2022-05-19

## 2022-05-19 MED ADMIN — diazePAM (VALIUM) injection 5 mg: 5 mg | INTRAVENOUS | @ 18:00:00 | Stop: 2022-05-19

## 2022-05-19 NOTE — Unmapped (Signed)
[facility]  Emergency Department Provider Note      ED Clinical Impression     Final diagnoses:   Dysphagia, unspecified type (Primary)       Initial Impression, ED Course, Assessment and Plan     Time seen: May 19, 2022 10:18 AM   Paul Hurst Sr. is a 57 y.o. 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     On exam, the patient appears alert and in no acute distress. VS are within normal limits. Physical exam revealed tachycardia, regular rhythm. 4/5 strength to the LLE and LUE. Slurred speech.    Impression: Slurred speech and dysphagia. Likely sequelae of recent stroke vs possible new hemorrhage vs metabolic process vs TIA vs substance use.    Plan for CT Head, basic labs, and uTox.      11:19 AM  CT Head revealing chronic sequelae of prior right basal ganglia hemorrhage. Plan to consult neurology regarding stroke stroke care and swallow studies.     12:00 PM  Discussed patient with Neurology who recommends MRI Brain for further workup. If MRI Brain is negative for acute changes, recommend admission for speech therapy and swallow evaluation. Could also consider GI/ENT evaluation for swallowing difficulty.      Patient signed out awaiting MRI read at 3pm            Medical Decision Making      Social Determinants of Health with Concerns     Tobacco Use: Medium Risk (05/19/2022)    Patient History     Smoking Tobacco Use: Former     Smokeless Tobacco Use: Never     Passive Exposure: Not on file   Physical Activity: Inactive (08/29/2017)    Received from [facility]    Exercise Vital Sign     Days of Exercise per Week: 0 days     Minutes of Exercise per Session: 0 min   Social Connections: Unknown (08/29/2017)    Received from [facility]    Social Connection and Isolation Panel [NHANES]     Frequency of Communication with Friends and Family: Patient declined     Frequency of Social Gatherings with Friends and Family: Patient declined     Attends Religious Services: Patient declined     Database administrator or Organizations: Patient declined     Attends Banker Meetings: Patient declined     Marital Status: Divorced         ____________________________________________        History     Chief Complaint  Difficulty Swallowing      HPI   Paul Dressel Sr. is a 57 y.o. male who presents to the [facility] Emergency Department for difficulty swallowing. The patient reports difficulty swallowing since he was seen in the ED on 3/30 for slurred speech with concern for a possible stroke. His CTA Head was generally reassuring with no evidence of an acute stroke, but showed his chronic infract from a CVA is 2023. He often feels like he is choking secondary to his difficulty swallowing, but does not feel that he has aspirated anything. Additionally, he endorses a mild cough, but states this has been ongoing for some time.      Past Medical History  Past Medical  History:   Diagnosis Date    Diabetes mellitus (CMS-HCC)     Rheumatoid arthritis(714.0)     Stroke (CMS-HCC)        Past Surgical History  No past surgical history on file.    Medications  No current facility-administered medications for this encounter.    Current Outpatient Medications:     acetaminophen (TYLENOL) 500 MG tablet, Take 1 tablet (500 mg total) by mouth every six (6) hours as needed for pain., Disp: , Rfl:     amitriptyline (ELAVIL) 25 MG tablet, Take 1 tablet (25 mg total) by mouth nightly., Disp: 90 tablet, Rfl: 3    aspirin 81 MG chewable tablet, Chew 1 tablet (81 mg total) daily., Disp: 30 tablet, Rfl: 3    atorvastatin (LIPITOR) 80 MG tablet, Take 1 tablet (80 mg total) by mouth every evening., Disp: 180 tablet, Rfl: 0    blood sugar diagnostic (ONETOUCH ULTRA TEST) Strp, by Other route daily before breakfast., Disp: 100 strip, Rfl: 1    carvedilol (COREG) 6.25 MG tablet, Take 1 tablet (6.25 mg total) by mouth two (2) times a day., Disp: 180 tablet, Rfl: 0    cholecalciferol, vitamin D3-10 mcg, 400 unit,, 10 mcg (400 unit) Tab, Take 5 tablets (50 mcg total) by mouth daily., Disp: , Rfl:     clopidogrel (PLAVIX) 75 mg tablet, Take 1 tablet (75 mg total) by mouth daily., Disp: 90 tablet, Rfl: 0    cyanocobalamin/folic acid (VITAMIN B12-FOLIC ACID) 1,000-400 mcg Lozg, Take 1,000 mcg by mouth daily with evening meal., Disp: , Rfl:     cyclobenzaprine (FLEXERIL) 10 MG tablet, Take 1 tablet (10 mg total) by mouth Three (3) times a day as needed., Disp: 90 tablet, Rfl: 1    empty container Misc, USE AS DIRECTED, Disp: 1 each, Rfl: 2    ferrous sulfate 325 (65 FE) MG tablet, Take 1 tablet (325 mg total) by mouth every other day., Disp: 15 tablet, Rfl: 11    folic acid (FOLVITE) 1 MG tablet, Take 1 tablet (1 mg total) by mouth daily. (Patient taking differently: Take 800 mcg by mouth daily.), Disp: 30 tablet, Rfl: 6    gabapentin (NEURONTIN) 300 MG capsule, TAKE 1 CAPSULE BY MOUTH THREE TIMES A DAY., Disp: 90 capsule, Rfl: 3    golimumab (SIMPONI) 50 mg/0.5 mL PnIj pen injector, Inject the contents of 1 pen (50 mg total) under the skin every twenty-eight (28) days., Disp: 3 mL, Rfl: 3    ibuprofen (ADVIL) 200 MG tablet, Take 1 tablet (200 mg total) by mouth every six (6) hours as needed for pain., Disp: , Rfl:     melatonin 10 mg Chew, Chew 10 mg nightly., Disp: , Rfl:     metFORMIN (GLUCOPHAGE) 500 MG tablet, Take 1 tablet (500 mg total) by mouth in the morning and 1 tablet (500 mg total) in the evening. Take with meals., Disp: 180 tablet, Rfl: 1    methotrexate, Preservative Free, 25 mg/mL Soln vial/syringe, INJECT 1 ML (25 MG TOTAL) UNDER THE SKIN ONCE A WEEK., Disp: 6 mL, Rfl: 3    nitroglycerin (NITROSTAT) 0.4 MG SL tablet, Place 1 tablet (0.4 mg total) under the tongue every five (5) minutes as needed for chest pain. Maximum of 3 doses in 15 minutes., Disp: 25 tablet, Rfl: 0    ondansetron (ZOFRAN) 4 MG tablet, Take 1 tablet (4 mg total) by mouth every twelve (12) hours as needed for nausea., Disp: , Rfl:  syringe with needle 1 mL 25 gauge x 5/8 Syrg, For use with injectable methotrexate, Disp: 50 each, Rfl: 0    traMADoL (ULTRAM) 50 mg tablet, Take 1 tablet (50 mg total) by mouth every twelve (12) hours as needed for pain., Disp: 60 tablet, Rfl: 3    Allergies  Seroquel [quetiapine]    Family History  History reviewed. No pertinent family history.    Social History  Social History     Tobacco Use    Smoking status: Former     Current packs/day: 0.00     Types: Cigarettes     Quit date: 04/29/2020     Years since quitting: 2.0    Smokeless tobacco: Never       Review of Systems:   Pertinent positives and negatives are documented as per the HPI    Physical Exam     VITAL SIGNS:    ED Triage Vitals [05/19/22 1012]   Enc Vitals Group      BP 120/99      Heart Rate 94      SpO2 Pulse       Resp 16      Temp 36.3 ??C (97.3 ??F)      Temp src       SpO2 99 %       Constitutional: Alert and oriented. Well appearing and in no distress.  Eyes: Conjunctivae are normal.   ENT       Head: Normocephalic and atraumatic.       Nose: No congestion.       Mouth/Throat: Mucous membranes are moist.       Neck: No stridor.  Cardiovascular: Tachycardic, regular rhythm.  Respiratory: Normal respiratory effort.  Gastrointestinal: Soft, non tender, non distend, without rebound or guarding.  Musculoskeletal: No deformities.  Neurologic: 4/5 strength to the LLE and LUE. Slurred speech.  Skin: Skin is warm, dry and intact. No rash noted.      EKG       Radiology     CT Head Wo Contrast   Final Result   Chronic sequelae of prior right basal ganglia hemorrhage.   No acute intracranial abnormality.      XR Chest 2 views    (Results Pending)       Labs     Labs Reviewed   CBC W/ DIFFERENTIAL    Narrative:     The following orders were created for panel order CBC w/ Differential.                  Procedure                               Abnormality         Status                                     ---------                               -----------         ------                                     CBC w/ Differential[[id number]]  Please view results for these tests on the individual orders.   COMPREHENSIVE METABOLIC PANEL   TOXICOLOGY SCREEN, URINE   ETHANOL   TOXICOLOGY SCREEN, URINE   HIGH SENSITIVITY TROPONIN I - SINGLE   CBC W/ AUTO DIFF       Pertinent labs & imaging results that were available during my care of the patient were reviewed by me and considered in my medical decision making (see chart for details).      Documentation assistance was provided by Nadene Rubins, Scribe, on May 19, 2022 at 10:18 AM for Arliss Journey, DO.  A scribe was used when documenting this visit. I agree with the above documentation. Signed by  Marlan Palau, DO on  May 22, 2022 at 8:13 AM              Jones Skene, DO  05/22/22 [id number]

## 2022-05-19 NOTE — Unmapped (Signed)
Pt reports having difficulty swallowing x 4 days. States he was seen here on 05/15/22 for stroke like symptoms. He has weakness to L sided body from the previous stroke

## 2022-05-19 NOTE — Unmapped (Addendum)
[facility]  Emergency Department Progress Note    May 19, 2022 3:21 PM    Last VS:  BP 120/99  - Pulse 94  - Temp 36.3 ??C (97.3 ??F)  - Resp 16  - SpO2 99%     Briefly, Paul Macphail Sr. is a 57 y.o. male with a PMH of  T2DM, RA, and prior CVA (right thalamic infarct 06/01/2021 and right basal ganglia hemorrhage 06/08/2021) with residual L-sided deficits presenting with 6 days of progressively worsening dysphagia.     CT head with chronic sequelae of prior right basal ganglia hemorrhage. No acute intracranial abnormality. CXR with mild diffuse bronchial thickening with minimal peribronchial hazy airspace opacities in bilateral lung parenchyma, better demonstrated on recently performed CT neck dated 05/13/2022, consistent with infection. U-tox positive for cannabinoids. CBC without leukocytosis or anemia. CMP without any electrolyte abnormalities. Initial troponin negative.     At the time of sign out, pending MRI brain.     ED Course as of 05/21/22 1152   Fri May 19, 2022   1605 Cannabinoid Scrn, Ur(!): Positive   1605 Alcohol, Ethyl: <10   1605 hsTroponin I: <3   1605 Creatinine(!): 0.66   1605 WBC: 8.5   1605 HGB: 16.1  Labs reassuring   1605 CT: IMPRESSION:  Chronic sequelae of prior right basal ganglia hemorrhage.  No acute intracranial abnormality.     1701 Call from radiology.  Patient's MRI shows a small infarct in the right midbrain.  Results were discussed with patient.  The plan was to direct admit him to neurology but patient declines.  I discussed the risks of leaving, including not identifying a potential swallowing problem which could lead to an aspiration pneumonia.  Patient verbalized understanding he is competent to make medical decisions.  I asked him why he came to the hospital and he stated it was because he wanted to get an appointment with neurology that was before his current August appointment.  Will discuss with neurology.   1723 I spoke to neurologist Dr. Mayford Knife.  They will call the scheduler to get the patient seen in neurology clinic expeditiously.  She stated he had been on dual antiplatelet therapy for the CHANCE protocol but he does not need to be on Plavix anymore so she advised him to stop this.  This was communicated to patient.  Careful return precautions provided.  Discharged home in good condition.       See chart and resident documentation for details.     Additional Medical Decision Making       I reviewed the nursing notes and vital signs.     Independent Interpretation of Studies  Note has been documented by Desma Paganini on 05/19/2022

## 2022-05-24 DIAGNOSIS — F4312 Post-traumatic stress disorder, chronic: Secondary | ICD-10-CM | POA: Diagnosis not present

## 2022-05-24 DIAGNOSIS — F54 Psychological and behavioral factors associated with disorders or diseases classified elsewhere: Secondary | ICD-10-CM | POA: Diagnosis not present

## 2022-05-26 DIAGNOSIS — R079 Chest pain, unspecified: Secondary | ICD-10-CM | POA: Diagnosis not present

## 2022-05-26 DIAGNOSIS — I69322 Dysarthria following cerebral infarction: Secondary | ICD-10-CM | POA: Diagnosis not present

## 2022-05-26 DIAGNOSIS — J984 Other disorders of lung: Secondary | ICD-10-CM | POA: Diagnosis not present

## 2022-05-26 DIAGNOSIS — I69354 Hemiplegia and hemiparesis following cerebral infarction affecting left non-dominant side: Secondary | ICD-10-CM | POA: Diagnosis not present

## 2022-05-26 DIAGNOSIS — R0789 Other chest pain: Secondary | ICD-10-CM | POA: Diagnosis not present

## 2022-05-27 DIAGNOSIS — J984 Other disorders of lung: Secondary | ICD-10-CM | POA: Diagnosis not present

## 2022-06-08 ENCOUNTER — Inpatient Hospital Stay
Admission: EM | Admit: 2022-06-08 | Discharge: 2022-06-19 | DRG: 917 | Disposition: A | Discharge status: Home Health | Payer: Medicare Other | Attending: Internal Medicine | Admitting: Internal Medicine

## 2022-06-08 ENCOUNTER — Emergency Department: Payer: Medicare Other

## 2022-06-08 DIAGNOSIS — T481X2A Poisoning by skeletal muscle relaxants [neuromuscular blocking agents], intentional self-harm, initial encounter: Secondary | ICD-10-CM | POA: Diagnosis present

## 2022-06-08 DIAGNOSIS — I639 Cerebral infarction, unspecified: Secondary | ICD-10-CM | POA: Diagnosis present

## 2022-06-08 DIAGNOSIS — E871 Hypo-osmolality and hyponatremia: Secondary | ICD-10-CM | POA: Diagnosis present

## 2022-06-08 DIAGNOSIS — I10 Essential (primary) hypertension: Secondary | ICD-10-CM | POA: Diagnosis not present

## 2022-06-08 DIAGNOSIS — J9690 Respiratory failure, unspecified, unspecified whether with hypoxia or hypercapnia: Secondary | ICD-10-CM | POA: Diagnosis not present

## 2022-06-08 DIAGNOSIS — T43012A Poisoning by tricyclic antidepressants, intentional self-harm, initial encounter: Secondary | ICD-10-CM | POA: Diagnosis not present

## 2022-06-08 DIAGNOSIS — R1310 Dysphagia, unspecified: Secondary | ICD-10-CM | POA: Diagnosis present

## 2022-06-08 DIAGNOSIS — Z4659 Encounter for fitting and adjustment of other gastrointestinal appliance and device: Secondary | ICD-10-CM | POA: Diagnosis not present

## 2022-06-08 DIAGNOSIS — G4089 Other seizures: Secondary | ICD-10-CM | POA: Diagnosis present

## 2022-06-08 DIAGNOSIS — R4182 Altered mental status, unspecified: Secondary | ICD-10-CM

## 2022-06-08 DIAGNOSIS — G9389 Other specified disorders of brain: Secondary | ICD-10-CM | POA: Diagnosis present

## 2022-06-08 DIAGNOSIS — Z79899 Other long term (current) drug therapy: Secondary | ICD-10-CM

## 2022-06-08 DIAGNOSIS — Z809 Family history of malignant neoplasm, unspecified: Secondary | ICD-10-CM

## 2022-06-08 DIAGNOSIS — R404 Transient alteration of awareness: Secondary | ICD-10-CM | POA: Diagnosis not present

## 2022-06-08 DIAGNOSIS — Z7984 Long term (current) use of oral hypoglycemic drugs: Secondary | ICD-10-CM | POA: Diagnosis not present

## 2022-06-08 DIAGNOSIS — M069 Rheumatoid arthritis, unspecified: Secondary | ICD-10-CM | POA: Diagnosis present

## 2022-06-08 DIAGNOSIS — Z888 Allergy status to other drugs, medicaments and biological substances status: Secondary | ICD-10-CM

## 2022-06-08 DIAGNOSIS — Z7982 Long term (current) use of aspirin: Secondary | ICD-10-CM | POA: Diagnosis not present

## 2022-06-08 DIAGNOSIS — R0902 Hypoxemia: Secondary | ICD-10-CM | POA: Diagnosis not present

## 2022-06-08 DIAGNOSIS — G9341 Metabolic encephalopathy: Secondary | ICD-10-CM

## 2022-06-08 DIAGNOSIS — F32A Depression, unspecified: Secondary | ICD-10-CM | POA: Diagnosis present

## 2022-06-08 DIAGNOSIS — G4700 Insomnia, unspecified: Secondary | ICD-10-CM | POA: Diagnosis present

## 2022-06-08 DIAGNOSIS — Z833 Family history of diabetes mellitus: Secondary | ICD-10-CM | POA: Diagnosis not present

## 2022-06-08 DIAGNOSIS — E119 Type 2 diabetes mellitus without complications: Secondary | ICD-10-CM | POA: Diagnosis present

## 2022-06-08 DIAGNOSIS — G928 Other toxic encephalopathy: Secondary | ICD-10-CM | POA: Diagnosis present

## 2022-06-08 DIAGNOSIS — R569 Unspecified convulsions: Secondary | ICD-10-CM | POA: Diagnosis not present

## 2022-06-08 DIAGNOSIS — F4310 Post-traumatic stress disorder, unspecified: Secondary | ICD-10-CM | POA: Diagnosis present

## 2022-06-08 DIAGNOSIS — I6389 Other cerebral infarction: Secondary | ICD-10-CM | POA: Diagnosis present

## 2022-06-08 DIAGNOSIS — T50902A Poisoning by unspecified drugs, medicaments and biological substances, intentional self-harm, initial encounter: Secondary | ICD-10-CM | POA: Diagnosis not present

## 2022-06-08 DIAGNOSIS — T887XXA Unspecified adverse effect of drug or medicament, initial encounter: Secondary | ICD-10-CM | POA: Diagnosis not present

## 2022-06-08 DIAGNOSIS — Z87891 Personal history of nicotine dependence: Secondary | ICD-10-CM | POA: Diagnosis not present

## 2022-06-08 DIAGNOSIS — I69354 Hemiplegia and hemiparesis following cerebral infarction affecting left non-dominant side: Secondary | ICD-10-CM

## 2022-06-08 DIAGNOSIS — R29810 Facial weakness: Secondary | ICD-10-CM | POA: Diagnosis present

## 2022-06-08 DIAGNOSIS — Z7902 Long term (current) use of antithrombotics/antiplatelets: Secondary | ICD-10-CM

## 2022-06-08 DIAGNOSIS — T50901A Poisoning by unspecified drugs, medicaments and biological substances, accidental (unintentional), initial encounter: Secondary | ICD-10-CM | POA: Diagnosis not present

## 2022-06-08 DIAGNOSIS — I6501 Occlusion and stenosis of right vertebral artery: Secondary | ICD-10-CM | POA: Diagnosis present

## 2022-06-08 DIAGNOSIS — T50902S Poisoning by unspecified drugs, medicaments and biological substances, intentional self-harm, sequela: Secondary | ICD-10-CM | POA: Diagnosis not present

## 2022-06-08 DIAGNOSIS — I6521 Occlusion and stenosis of right carotid artery: Secondary | ICD-10-CM | POA: Diagnosis not present

## 2022-06-08 DIAGNOSIS — R471 Dysarthria and anarthria: Secondary | ICD-10-CM | POA: Diagnosis present

## 2022-06-08 DIAGNOSIS — F4325 Adjustment disorder with mixed disturbance of emotions and conduct: Secondary | ICD-10-CM | POA: Diagnosis present

## 2022-06-08 DIAGNOSIS — R29716 NIHSS score 16: Secondary | ICD-10-CM | POA: Diagnosis present

## 2022-06-08 DIAGNOSIS — T50902D Poisoning by unspecified drugs, medicaments and biological substances, intentional self-harm, subsequent encounter: Secondary | ICD-10-CM | POA: Diagnosis not present

## 2022-06-08 LAB — URINALYSIS, COMPLETE (UACMP) WITH MICROSCOPIC
Bilirubin Urine: NEGATIVE
Glucose, UA: NEGATIVE mg/dL
Hgb urine dipstick: NEGATIVE
Ketones, ur: NEGATIVE mg/dL
Leukocytes,Ua: NEGATIVE
Nitrite: NEGATIVE
Protein, ur: NEGATIVE mg/dL
Specific Gravity, Urine: 1.013 (ref 1.005–1.030)
Squamous Epithelial / HPF: NONE SEEN /HPF (ref 0–5)
pH: 7 (ref 5.0–8.0)

## 2022-06-08 LAB — COMPREHENSIVE METABOLIC PANEL
ALT: 20 U/L (ref 0–44)
AST: 31 U/L (ref 15–41)
Albumin: 3.5 g/dL (ref 3.5–5.0)
Alkaline Phosphatase: 79 U/L (ref 38–126)
Anion gap: 9 (ref 5–15)
BUN: 7 mg/dL (ref 6–20)
CO2: 26 mmol/L (ref 22–32)
Calcium: 8.7 mg/dL — ABNORMAL LOW (ref 8.9–10.3)
Chloride: 104 mmol/L (ref 98–111)
Creatinine, Ser: 0.66 mg/dL (ref 0.61–1.24)
GFR, Estimated: >60 mL/min (ref >60)
Glucose, Bld: 141 mg/dL — ABNORMAL HIGH (ref 70–99)
Potassium: 4 mmol/L (ref 3.5–5.1)
Sodium: 139 mmol/L (ref 135–145)
Total Bilirubin: 0.8 mg/dL (ref 0.3–1.2)
Total Protein: 7.2 g/dL (ref 6.5–8.1)

## 2022-06-08 LAB — CBC WITH DIFFERENTIAL/PLATELET
Abs Immature Granulocytes: 0.02 10*3/uL (ref 0.00–0.07)
Basophils Absolute: 0 10*3/uL (ref 0.0–0.1)
Basophils Relative: 0 %
Eosinophils Absolute: 0.1 10*3/uL (ref 0.0–0.5)
Eosinophils Relative: 2 %
HCT: 42 % (ref 39.0–52.0)
Hemoglobin: 14.4 g/dL (ref 13.0–17.0)
Immature Granulocytes: 0 %
Lymphocytes Relative: 41 %
Lymphs Abs: 2.3 10*3/uL (ref 0.7–4.0)
MCH: 30.7 pg (ref 26.0–34.0)
MCHC: 34.3 g/dL (ref 30.0–36.0)
MCV: 89.6 fL (ref 80.0–100.0)
Monocytes Absolute: 0.3 10*3/uL (ref 0.1–1.0)
Monocytes Relative: 6 %
Neutro Abs: 2.8 10*3/uL (ref 1.7–7.7)
Neutrophils Relative %: 51 %
Platelets: 218 10*3/uL (ref 150–400)
RBC: 4.69 MIL/uL (ref 4.22–5.81)
RDW: 12.3 % (ref 11.5–15.5)
WBC: 5.5 10*3/uL (ref 4.0–10.5)
nRBC: 0 % (ref 0.0–0.2)

## 2022-06-08 LAB — BLOOD GAS, VENOUS
Acid-Base Excess: 10.1 mmol/L — ABNORMAL HIGH (ref 0.0–2.0)
Bicarbonate: 33.2 mmol/L — ABNORMAL HIGH (ref 20.0–28.0)
O2 Saturation: 98.9 %
Patient temperature: 37
pCO2, Ven: 38 mmHg — ABNORMAL LOW (ref 44–60)
pH, Ven: 7.55 — ABNORMAL HIGH (ref 7.25–7.43)
pO2, Ven: 81 mmHg — ABNORMAL HIGH (ref 32–45)

## 2022-06-08 LAB — RENAL FUNCTION PANEL
Albumin: 3.5 g/dL (ref 3.5–5.0)
Anion gap: 10 (ref 5–15)
BUN: 6 mg/dL (ref 6–20)
CO2: 23 mmol/L (ref 22–32)
Calcium: 8.9 mg/dL (ref 8.9–10.3)
Chloride: 107 mmol/L (ref 98–111)
Creatinine, Ser: 0.6 mg/dL — ABNORMAL LOW (ref 0.61–1.24)
GFR, Estimated: >60 mL/min (ref >60)
Glucose, Bld: 124 mg/dL — ABNORMAL HIGH (ref 70–99)
Phosphorus: 3.6 mg/dL (ref 2.5–4.6)
Potassium: 4.2 mmol/L (ref 3.5–5.1)
Sodium: 140 mmol/L (ref 135–145)

## 2022-06-08 LAB — BLOOD GAS, ARTERIAL
PEEP: 5 cmH2O
pH, Arterial: 7.38 (ref 7.35–7.45)
pO2, Arterial: 195 mmHg — ABNORMAL HIGH (ref 83–108)

## 2022-06-08 LAB — URINE DRUG SCREEN, QUALITATIVE (ARMC ONLY)
Amphetamines, Ur Screen: NOT DETECTED
Barbiturates, Ur Screen: NOT DETECTED
Benzodiazepine, Ur Scrn: POSITIVE — AB
Cannabinoid 50 Ng, Ur ~~LOC~~: POSITIVE — AB
Cocaine Metabolite,Ur ~~LOC~~: NOT DETECTED
MDMA (Ecstasy)Ur Screen: NOT DETECTED
Methadone Scn, Ur: NOT DETECTED
Opiate, Ur Screen: NOT DETECTED
Phencyclidine (PCP) Ur S: NOT DETECTED
Tricyclic, Ur Screen: POSITIVE — AB

## 2022-06-08 LAB — GLUCOSE, CAPILLARY: Glucose-Capillary: 269 mg/dL — ABNORMAL HIGH (ref 70–99)

## 2022-06-08 LAB — ACETAMINOPHEN LEVEL: Acetaminophen (Tylenol), Serum: <10 ug/mL — ABNORMAL LOW (ref 10–30)

## 2022-06-08 LAB — MAGNESIUM: Magnesium: 2 mg/dL (ref 1.7–2.4)

## 2022-06-08 LAB — MRSA NEXT GEN BY PCR, NASAL: MRSA by PCR Next Gen: NOT DETECTED

## 2022-06-08 LAB — ETHANOL: Alcohol, Ethyl (B): <10 mg/dL (ref <10)

## 2022-06-08 LAB — PROCALCITONIN: Procalcitonin: <0.1 ng/mL

## 2022-06-08 LAB — SALICYLATE LEVEL: Salicylate Lvl: <7 mg/dL — ABNORMAL LOW (ref 7.0–30.0)

## 2022-06-08 MED ORDER — DOCUSATE SODIUM 100 MG PO CAPS: 100.0000 mg | ORAL_CAPSULE | Freq: Two times a day (BID) | ORAL | Status: DC | PRN

## 2022-06-08 MED ORDER — FENTANYL CITRATE (PF) 100 MCG/2ML IJ SOLN: INTRAMUSCULAR | Status: AC | PRN

## 2022-06-08 MED ORDER — SODIUM BICARBONATE 8.4 % IV SOLN
50.0000 meq | Freq: Once | INTRAVENOUS | Status: AC
  Administered 2022-06-08: 50 meq via INTRAVENOUS
  Filled 2022-06-08: qty 50

## 2022-06-08 MED ORDER — FENTANYL BOLUS VIA INFUSION
50.0000 ug | INTRAVENOUS | Status: DC | PRN
  Administered 2022-06-08: 50 ug via INTRAVENOUS

## 2022-06-08 MED ORDER — ROCURONIUM BROMIDE 10 MG/ML (PF) SYRINGE
PREFILLED_SYRINGE | INTRAVENOUS | Status: AC
  Filled 2022-06-08: qty 10

## 2022-06-08 MED ORDER — ASPIRIN 81 MG PO CHEW
81.0000 mg | CHEWABLE_TABLET | Freq: Every day | ORAL | Status: DC
  Administered 2022-06-09: 81 mg via ORAL
  Filled 2022-06-08: qty 1

## 2022-06-08 MED ORDER — CHLORHEXIDINE GLUCONATE CLOTH 2 % EX PADS
6.0000 | MEDICATED_PAD | Freq: Every day | CUTANEOUS | Status: DC
  Administered 2022-06-08 – 2022-06-12 (×4): 6 via TOPICAL

## 2022-06-08 MED ORDER — FENTANYL 2500MCG IN NS 250ML (10MCG/ML) PREMIX INFUSION: 25.0000 ug/h | INTRAVENOUS | Status: DC

## 2022-06-08 MED ORDER — SODIUM BICARBONATE 8.4 % IV SOLN
50.0000 meq | Freq: Once | INTRAVENOUS | Status: AC
  Administered 2022-06-08: 50 meq via INTRAVENOUS

## 2022-06-08 MED ORDER — POLYETHYLENE GLYCOL 3350 17 G PO PACK
17.0000 g | PACK | Freq: Every day | ORAL | Status: DC | PRN
  Administered 2022-06-15: 17 g

## 2022-06-08 MED ORDER — ETOMIDATE 2 MG/ML IV SOLN
INTRAVENOUS | Status: AC | PRN
  Administered 2022-06-08: 10 mg via INTRAVENOUS

## 2022-06-08 MED ORDER — INSULIN ASPART 100 UNIT/ML IJ SOLN
0.0000 [IU] | INTRAMUSCULAR | Status: DC
  Administered 2022-06-08: 8 [IU] via SUBCUTANEOUS
  Administered 2022-06-09 – 2022-06-12 (×6): 2 [IU] via SUBCUTANEOUS
  Filled 2022-06-08 (×9): qty 1

## 2022-06-08 MED ORDER — SODIUM CHLORIDE 0.9 % IV SOLN: INTRAVENOUS | Status: DC

## 2022-06-08 MED ORDER — CHARCOAL ACTIVATED PO LIQD
50.0000 g | Freq: Once | ORAL | Status: AC
  Administered 2022-06-08: 50 g via ORAL
  Filled 2022-06-08 (×2): qty 240

## 2022-06-08 MED ORDER — MIDAZOLAM BOLUS VIA INFUSION: 0.0000 mg | INTRAVENOUS | Status: DC | PRN

## 2022-06-08 MED ORDER — ROCURONIUM BROMIDE 50 MG/5ML IV SOLN
INTRAVENOUS | Status: AC | PRN
  Administered 2022-06-08: 100 mg via INTRAVENOUS

## 2022-06-08 MED ORDER — ETOMIDATE 2 MG/ML IV SOLN
INTRAVENOUS | Status: AC
  Administered 2022-06-08: 20 mg via INTRAVENOUS
  Filled 2022-06-08: qty 10

## 2022-06-08 MED ORDER — SODIUM CHLORIDE 0.9 % IV BOLUS
1000.0000 mL | Freq: Once | INTRAVENOUS | Status: AC
  Administered 2022-06-08: 1000 mL via INTRAVENOUS

## 2022-06-08 MED ORDER — MIDAZOLAM-SODIUM CHLORIDE 100-0.9 MG/100ML-% IV SOLN
INTRAVENOUS | Status: AC
  Administered 2022-06-08: 2 mg/h via INTRAVENOUS
  Filled 2022-06-08: qty 100

## 2022-06-08 MED ORDER — MIDAZOLAM HCL 5 MG/5ML IJ SOLN: INTRAMUSCULAR | Status: AC | PRN

## 2022-06-08 MED ORDER — ENOXAPARIN SODIUM 40 MG/0.4ML IJ SOSY
40.0000 mg | PREFILLED_SYRINGE | INTRAMUSCULAR | Status: DC
  Administered 2022-06-08 – 2022-06-18 (×11): 40 mg via SUBCUTANEOUS
  Filled 2022-06-08 (×11): qty 0.4

## 2022-06-08 MED ORDER — PANTOPRAZOLE SODIUM 40 MG IV SOLR
40.0000 mg | Freq: Every day | INTRAVENOUS | Status: DC
  Administered 2022-06-08 – 2022-06-13 (×6): 40 mg via INTRAVENOUS
  Filled 2022-06-08 (×6): qty 10

## 2022-06-08 MED ORDER — POLYETHYLENE GLYCOL 3350 17 G PO PACK
17.0000 g | PACK | Freq: Every day | ORAL | Status: DC
  Administered 2022-06-09 – 2022-06-14 (×3): 17 g
  Filled 2022-06-08 (×3): qty 1

## 2022-06-08 MED ORDER — FENTANYL 2500MCG IN NS 250ML (10MCG/ML) PREMIX INFUSION
INTRAVENOUS | Status: AC
  Administered 2022-06-08: 50 ug/h via INTRAVENOUS
  Filled 2022-06-08: qty 250

## 2022-06-08 MED ORDER — MIDAZOLAM-SODIUM CHLORIDE 100-0.9 MG/100ML-% IV SOLN
0.5000 mg/h | INTRAVENOUS | Status: DC
  Filled 2022-06-08: qty 100

## 2022-06-08 MED ORDER — SODIUM CHLORIDE 0.9 % IV SOLN
INTRAVENOUS | Status: AC | PRN
  Administered 2022-06-08: 1000 mL via INTRAVENOUS

## 2022-06-08 MED ORDER — DOCUSATE SODIUM 50 MG/5ML PO LIQD
100.0000 mg | Freq: Two times a day (BID) | ORAL | Status: DC
  Administered 2022-06-08 – 2022-06-14 (×5): 100 mg
  Filled 2022-06-08 (×7): qty 10

## 2022-06-08 NOTE — ED Notes (Signed)
Activated charcoal administration complete.

## 2022-06-08 NOTE — ED Notes (Signed)
Tried calling report to the ICU, was told by the secretary "they just got the beep, and they have 15 mins to call and get report."

## 2022-06-08 NOTE — ED Triage Notes (Signed)
Pt arrived via [facility] c/o intentional drug overdose; 750 mg amitriptyline, 100 mg flexaril. Pt received 100 milli units of bicarb enroute to ED. Pt is unresponsive on arrival.

## 2022-06-08 NOTE — ED Notes (Addendum)
Versed drip started and no one dose of versed given.

## 2022-06-08 NOTE — ED Provider Notes (Signed)
[facility] Provider Note    Event Date/Time   First MD Initiated Contact with Patient 06/08/22 1531     (approximate)   History   Drug Overdose  Level V Caveat:  unresponsive  HPI  Patrick Perez is a 57 y.o. male with a history of depression, CVA presents to the ER unresponsive after reported overdose on 750 mg of amitriptyline and Flexeril 1 to 2 hours prior to arrival.  Reportedly did have brief seizure-like activity.  Was given 2 mg of Versed as well as 2 A of sodium bicarb due to prolonged QRS.  Patient arrives obtunded with spontaneous respirations but not cooperative with exam.     Physical Exam   Triage Vital Signs: ED Triage Vitals [06/08/22 1523]  Enc Vitals Group     BP (!) 150/102     Pulse Rate (!) 120     Resp 18     Temp      Temp src      SpO2      Weight      Height      Head Circumference      Peak Flow      Pain Score      Pain Loc      Pain Edu?      Excl. in GC?     Most recent vital signs: Vitals:   06/08/22 1700 06/08/22 1715  BP: 117/89 (!) 147/101  Pulse: 91 95  Resp: 16 16  SpO2: 100% 100%     Constitutional: No response to painful stimuli. Eyes: Conjunctivae are normal.  Pupils are midpoint and reactive Head: Atraumatic. Nose: No congestion/rhinnorhea. Mouth/Throat: Mucous membranes are moist.   Neck: Painless ROM.  Cardiovascular:   Good peripheral circulation. Respiratory: Normal respiratory effort.  No retractions.  Gastrointestinal: Soft and nontender.  Musculoskeletal:  no deformity Neurologic:  gcs 3 Skin:  Skin is warm, dry and intact. No rash noted. Psychiatric: Mood and affect are normal. Speech and behavior are normal.    ED Results / Procedures / Treatments   Labs (all labs ordered are listed, but only abnormal results are displayed) Labs Reviewed  COMPREHENSIVE METABOLIC PANEL - Abnormal; Notable for the following components:      Result Value   Glucose, Bld 141 (*)    Calcium  8.7 (*)    All other components within normal limits  ACETAMINOPHEN LEVEL - Abnormal; Notable for the following components:   Acetaminophen (Tylenol), Serum <10 (*)    All other components within normal limits  SALICYLATE LEVEL - Abnormal; Notable for the following components:   Salicylate Lvl <7.0 (*)    All other components within normal limits  BLOOD GAS, VENOUS - Abnormal; Notable for the following components:   pH, Ven 7.55 (*)    pCO2, Ven 38 (*)    pO2, Ven 81 (*)    Bicarbonate 33.2 (*)    Acid-Base Excess 10.1 (*)    All other components within normal limits  CBC WITH DIFFERENTIAL/PLATELET  ETHANOL  URINE DRUG SCREEN, QUALITATIVE ([facility] ONLY)  URINALYSIS, COMPLETE (UACMP) WITH MICROSCOPIC  BLOOD GAS, ARTERIAL  HIV ANTIBODY (ROUTINE TESTING W REFLEX)  CBC  RENAL FUNCTION PANEL     EKG  ED ECG REPORT I, Willy Eddy, the attending physician, personally viewed and interpreted this ECG.   Date: 06/08/2022  EKG Time: 15:22  Rate: 120  Rhythm: sinus  Axis: normal  Intervals: qrs 130  ST&T Change: non specific st  abn    RADIOLOGY Please see ED Course for my review and interpretation.  I personally reviewed all radiographic images ordered to evaluate for the above acute complaints and reviewed radiology reports and findings.  These findings were personally discussed with the patient.  Please see medical record for radiology report.    PROCEDURES:  Critical Care performed: Yes, see critical care procedure note(s)  Procedure Name: Intubation Date/Time: 06/08/2022 3:33 PM  Performed by: Willy Eddy, MDPre-anesthesia Checklist: Patient identified, Patient being monitored, Emergency Drugs available, Timeout performed and Suction available Oxygen Delivery Method: Non-rebreather mask Preoxygenation: Pre-oxygenation with 100% oxygen Induction Type: Rapid sequence Ventilation: Mask ventilation without difficulty Laryngoscope Size: Glidescope and 4 Tube  size: 8.0 mm Number of attempts: 1 Airway Equipment and Method: Video-laryngoscopy Placement Confirmation: ETT inserted through vocal cords under direct vision, CO2 detector and Breath sounds checked- equal and bilateral Secured at: 24 cm Tube secured with: ETT holder    .Critical Care  Performed by: Willy Eddy, MD Authorized by: Willy Eddy, MD   Critical care provider statement:    Critical care time (minutes):  50   Critical care was necessary to treat or prevent imminent or life-threatening deterioration of the following conditions:  Toxidrome   Critical care was time spent personally by me on the following activities:  Ordering and performing treatments and interventions, ordering and review of laboratory studies, ordering and review of radiographic studies, pulse oximetry, re-evaluation of patient's condition, review of old charts, obtaining history from patient or surrogate, examination of patient, evaluation of patient's response to treatment, discussions with primary provider, discussions with consultants and development of treatment plan with patient or surrogate    MEDICATIONS ORDERED IN ED: Medications  fentaNYL in NS (41mcg/ml) infusion-PREMIX (50 mcg/hr Intravenous New Bag/Given 06/08/22 1530)  midazolam (VERSED) 100 mg/100 mL (1 mg/mL) premix infusion (2 mg/hr Intravenous New Bag/Given 06/08/22 1533)  0.9 %  sodium chloride infusion ( Intravenous New Bag/Given 06/08/22 1721)  docusate sodium (COLACE) capsule 100 mg (has no administration in time range)  polyethylene glycol (MIRALAX / GLYCOLAX) packet 17 g (has no administration in time range)  enoxaparin (LOVENOX) injection 40 mg (has no administration in time range)  pantoprazole (PROTONIX) injection 40 mg (has no administration in time range)  aspirin chewable tablet 81 mg (has no administration in time range)  etomidate (AMIDATE) injection (10 mg Intravenous Given 06/08/22 1527)  rocuronium  (ZEMURON) injection (100 mg Intravenous Given 06/08/22 1528)  0.9 %  sodium chloride infusion (0 mLs Intravenous Stopped 06/08/22 1632)  fentaNYL (SUBLIMAZE) injection (50 mcg Intravenous Not Given 06/08/22 1531)  sodium chloride 0.9 % bolus 1,000 mL (0 mLs Intravenous Stopped 06/08/22 1632)  midazolam (VERSED) 5 MG/5ML injection (2 mg Intravenous Not Given 06/08/22 1533)  sodium bicarbonate injection 50 mEq (50 mEq Intravenous Given 06/08/22 1545)  charcoal activated (NO SORBITOL) (ACTIDOSE-AQUA) suspension 50 g (50 g Oral Given 06/08/22 1656)  sodium bicarbonate injection 50 mEq (50 mEq Intravenous Given 06/08/22 1709)     IMPRESSION / MDM / ASSESSMENT AND PLAN / ED COURSE  I reviewed the triage vital signs and the nursing notes.                              Differential diagnosis includes, but is not limited to, Dehydration, sepsis, pna, uti, hypoglycemia, cva, drug effect, withdrawal, encephalitis  Patient presenting to the ER for evaluation of symptoms as described above.  Based  on symptoms, risk factors and considered above differential, this presenting complaint could reflect a potentially life-threatening illness therefore the patient will be placed on continuous pulse oximetry and telemetry for monitoring.  Laboratory evaluation will be sent to evaluate for the above complaints.  Patient intubated for airway protection given significant OD.      Clinical Course as of 06/08/22 1731  Thu Jun 08, 2022  1545 QRS is 130 on EKG.  Will give additional bolus of sodium bicarb and recheck will continue to monitor.  X-ray on my review and interpretation does not show any mediastinal widening or consolidation.  He is satting well on ventilator. [PR]  1621 CT head on my review and interpretation without evidence of bleed. [PR]  1643 I discussed the case in consultation with Dr. Belia Heman of ICU who agrees to admit patient. [PR]    Clinical Course User Index [PR] Willy Eddy, MD     FINAL  CLINICAL IMPRESSION(S) / ED DIAGNOSES   Final diagnoses:  Intentional overdose, initial encounter  Altered mental status, unspecified altered mental status type     Rx / DC Orders   ED Discharge Orders     None        Note:  This document was prepared using Dragon voice recognition software and may include unintentional dictation errors.    Willy Eddy, MD 06/08/22 [phone]

## 2022-06-08 NOTE — ED Notes (Signed)
OG tube connected to intermittent suction.

## 2022-06-08 NOTE — ED Notes (Signed)
ICU NP at bedside

## 2022-06-08 NOTE — ED Notes (Signed)
Fent drip started no one time dose given.

## 2022-06-08 NOTE — H&P (Signed)
NAME:  Patrick Perez, MRN:  [id number], DOB:  05-23-1965, LOS: 0 ADMISSION DATE:  06/08/2022, CONSULTATION DATE:  06/08/22 REFERRING MD:  Dr. Roxan Hockey, CHIEF COMPLAINT:  Intentional drug overdose   Brief Pt Description / Synopsis:  57 y.o. Male with PMHx significant for stroke with left-sided hemiparesis and depression, who is admitted with Intentional Drug Overdose (Amitriptyline and Flexeril) requiring intubation for airway protection.  EMS reported seizure like activity.  History of Present Illness:  Patrick Perez is a 57 year old male with a past medical history significant for prior CVA (right thalamic infarct 06/01/2021 and right basal ganglia hemorrhage 06/08/2021) with left-sided hemiparesis, dysphagia, rheumatoid arthritis, type 2 diabetes mellitus who presents to [facility] ED on 06/08/2022 due to intentional drug overdose.  Patient is currently intubated and sedated, therefore history is obtained from patient's significant other at bedside along with chart review.  Patient's significant other at bedside reports that the patient called her and his mother today telling them that he had taken too many medications intentionally.  She reports that he has been depressed ever since his stroke approximately 1 year ago, and has refused to get established with neurology or to work with PT/OT.  Per EMS the patient was found to have taken amitriptyline and Flexeril 1-2 hours prior to presentation, and they also noted seizure-like activity.  He was given 2 mg of Versed and 2 A of sodium bicarb due to a prolonged QRS.  Upon arrival to the ED he was obtunded with spontaneous respirations, and decision was made by ED provider to intubate for airway protection.  Of note he was recently seen at [facility] campus on 05/19/2022 for progressive dysphagia and slurred speech over several days.  MRI of the brain was concerning for a small infarction in the right midbrain.  He refused admission by neurology.  He was to get  established with neurology outpatient, and decision was made to take him off Plavix.  ED Course: Initial Vital Signs: Blood pressure 150/102, pulse 120, respiratory 18, SpO2 100% on the vent Significant Labs: Glucose 141, serum acetaminophen less than 10, salicylates less than 7 VBG: pH 7.55/pCO2 38/pO2 81/bicarb 33.2 Imaging Chest X-ray>>FINDINGS: Chest x-ray: ET tube seen with the tip measured 5.8 cm above the carina. Underinflation with some interstitial and lung base mild parenchymal opacities. No pneumothorax, effusion or consolidation. Normal cardiopericardial silhouette. Overlapping cardiac leads. KUB>>Abdominal x-ray: Single limited view of the upper abdomen demonstrates enteric tube with tip of the level of the stomach with side hole of the GE junction. Recommend this be advanced further into the stomach. CT Head w/o contrast>>IMPRESSION: 1. Encephalomalacia and brain parenchymal volume loss at the site of prior right thalamus/right basal ganglia infarct. 2. Area of hypodensity extends from the previously infarcted parenchyma to the right pons and appears larger than on patient's prior CT. This may represent an area of acute/subacute infarction. Consider further evaluation with MRI. 3. No evidence of acute intracranial hemorrhage. Medications Administered: 2 Amps bicarb, activated charcoal given via NG, fentanyl and Versed infusion started  PCCM asked to admit for further workup and treatment.  Please see "significant hospital events" section below for full detailed hospital course.   Pertinent  Medical History   Past Medical History:  Diagnosis Date   Anxiety    Arthritis    rheumatoid   Depression    Diabetes mellitus without complication (HCC)    Stroke (HCC)      Micro Data:  N/A  Antimicrobials:  N/A  Significant Hospital  Events: Including procedures, antibiotic start and stop dates in addition to other pertinent events   4/25: Presented to ED  following intentional drug overdose.  Required intubation in the ED for airway protection.  PCCM asked to admit.  Interim History / Subjective:  -Pt sedated following intubation, not following commands currently -Hemodynamically stable, no vasopressors   Objective   Blood pressure (!) 134/95, pulse 96, resp. rate 16, SpO2 100 %.    Vent Mode: AC FiO2 (%):  [50 %] 50 % Set Rate:  [16 bmp] 16 bmp Vt Set:  [500 mL] 500 mL PEEP:  [5 cmH20] 5 cmH20  No intake or output data in the 24 hours ending 06/08/22 1715 There were no vitals filed for this visit.  Examination: General: Acute on chronically ill-appearing male, laying in bed, intubated and sedated, no acute distress HENT: Atraumatic, normocephalic, neck supple, no JVD, orally intubated Lungs: Clear breath sounds throughout, no wheezing or rales noted, even, synchronous with the vent Cardiovascular: Regular rate and rhythm, S1-S2, no murmurs, rubs, gallops Abdomen: Soft, nontender, nondistended, no guarding or rebound tenderness, bowel sounds positive x 4 Extremities: Normal bulk and tone, no deformities, no edema Neuro: Sedated, currently does not withdraw or follow commands (does have a history of left-sided hemiparesis from prior stroke), pupils PERRLA and sluggish at 3 mm bilaterally GU: Foley catheter in place draining yellow urine  Resolved Hospital Problem list     Assessment & Plan:   #Intubated for airway protection in setting of drug overdose -Full vent support, implement lung protective strategies -Plateau pressures less than 30 cm H20 -Wean FiO2 & PEEP as tolerated to maintain O2 sats >92% -Follow intermittent Chest X-ray & ABG as needed -Spontaneous Breathing Trials when respiratory parameters met and mental status permits -Implement VAP Bundle -Prn Bronchodilators  #Intentional Drug Overdose (reported that pt took Amitriptyline and Flexaril) -Continuous cardiac monitoring and intermittent EKG's -Bicarb if  needed for QRS duration >139ms (ok with pH 7.5-7.55) -Provide supportive care -Maintain MAP >65 -IV fluids -Vasopressors as needed to maintain MAP goal -Monitor I&O's / urinary output -Follow BMP -Ensure adequate renal perfusion -Avoid nephrotoxic agents as able -Replace electrolytes as indicated  #Diabetes Mellitus Type II  -CBG's q4h; Target range of 140 to 180 -SSI -Follow ICU Hypo/Hyperglycemia protocol -Check Hgb A1c  #Acute Metabolic Encephalopathy due to drug overdose #Seizure-like activity #Sedation needs in setting of mechanical ventilation PMHx: Stroke (right thalamic infarct 06/01/2021 and right basal ganglia hemorrhage 06/08/2021), PTSD, Depression -Maintain a RASS goal of 0 to -1 -Fentanyl and Versed as needed to maintain RASS goal -Seizure & Suicide precautions -Avoid sedating medications as able -Daily wake up assessment -Obtain MRI Brain and EEG -Check UDS -Likely will need Neuro consult (has not established with Neurology since previous strokes) -Once extubated will consult Psych      Best Practice (right click and "Reselect all SmartList Selections" daily)   Diet/type: NPO DVT prophylaxis: LMWH GI prophylaxis: PPI Lines: N/A Foley:  Yes, and it is still needed Code Status:  full code Last date of multidisciplinary goals of care discussion [N/A]  4/25: Pt's significant other updated at bedside.  Labs   CBC: Recent Labs  Lab 06/08/22 1535  WBC 5.5  NEUTROABS 2.8  HGB 14.4  HCT 42.0  MCV 89.6  PLT 218    Basic Metabolic Panel: Recent Labs  Lab 06/08/22 1535  NA 139  K 4.0  CL 104  CO2 26  GLUCOSE 141*  BUN 7  CREATININE 0.66  CALCIUM 8.7*   GFR: CrCl cannot be calculated (Unknown ideal weight.). Recent Labs  Lab 06/08/22 1535  WBC 5.5    Liver Function Tests: Recent Labs  Lab 06/08/22 1535  AST 31  ALT 20  ALKPHOS 79  BILITOT 0.8  PROT 7.2  ALBUMIN 3.5   No results for input(s): "LIPASE", "AMYLASE" in the last  168 hours. No results for input(s): "AMMONIA" in the last 168 hours.  ABG    Component Value Date/Time   HCO3 33.2 (H) 06/08/2022 1536   O2SAT 98.9 06/08/2022 1536     Coagulation Profile: No results for input(s): "INR", "PROTIME" in the last 168 hours.  Cardiac Enzymes: No results for input(s): "CKTOTAL", "CKMB", "CKMBINDEX", "TROPONINI" in the last 168 hours.  HbA1C: Hemoglobin A1C  Date/Time Value Ref Range Status  07/09/2020 12:00 AM 10.9  Final   Hgb A1c MFr Bld  Date/Time Value Ref Range Status  06/01/2021 11:15 AM 6.4 (H) 4.8 - 5.6 % Final    Comment:    (NOTE) Pre diabetes:          5.7%-6.4%  Diabetes:              >6.4%  Glycemic control for   <7.0% adults with diabetes   02/16/2021 10:01 AM 7.1 (H) 4.8 - 5.6 % Final    Comment:             Prediabetes: 5.7 - 6.4          Diabetes: >6.4          Glycemic control for adults with diabetes: <7.0     CBG: No results for input(s): "GLUCAP" in the last 168 hours.  Review of Systems:   Unable to assess due to AMS/intubation/sedation   Past Medical History:  He,  has a past medical history of Anxiety, Arthritis, Depression, Diabetes mellitus without complication (HCC), and Stroke (HCC).   Surgical History:   Past Surgical History:  Procedure Laterality Date   TIBIA FRACTURE SURGERY Left    TONSILLECTOMY       Social History:   reports that he quit smoking about 2 years ago. His smoking use included cigarettes. He has a 13.50 pack-year smoking history. He has never used smokeless tobacco. He reports current alcohol use of about 2.0 standard drinks of alcohol per week. He reports current drug use. Frequency: 7.00 times per week. Drug: Marijuana.   Family History:  His family history includes Cancer in his father and paternal grandfather; Diabetes in his mother.   Allergies Allergies  Allergen Reactions   Oxycodone Hives   Oxycodone-Acetaminophen Anxiety   Quetiapine Rash     Home Medications   Prior to Admission medications   Medication Sig Start Date End Date Taking? Authorizing Provider  amitriptyline (ELAVIL) 25 MG tablet Take 25 mg by mouth at bedtime.   Yes [provider]  atorvastatin (LIPITOR) 80 MG tablet TAKE 1 TABLET BY MOUTH EVERY DAY 08/25/21  Yes Duanne Limerick, MD  carvedilol (COREG) 6.25 MG tablet Take 6.25 mg by mouth 2 (two) times daily with a meal. 04/11/22  Yes [provider]  clopidogrel (PLAVIX) 75 MG tablet TAKE 1 TABLET BY MOUTH EVERY DAY 09/05/21  Yes Duanne Limerick, MD  cyclobenzaprine (FLEXERIL) 10 MG tablet Take 10 mg by mouth 3 (three) times daily as needed for muscle spasms.   Yes [provider]  folic acid (FOLVITE) 1 MG tablet Take by mouth. 01/20/13  Yes [provider]  gabapentin (NEURONTIN) 300 MG capsule Take 1 capsule (300 mg total) by mouth at bedtime. Dr Alfredo Batty Patient taking differently: Take 300 mg by mouth 3 (three) times daily. Dr Alfredo Batty 06/03/21  Yes Enedina Finner, MD  metFORMIN (GLUCOPHAGE) 500 MG tablet TAKE 1 TABLET BY MOUTH 2 TIMES DAILY WITH A MEAL. 08/18/21  Yes Duanne Limerick, MD  Methotrexate Sodium (METHOTREXATE, PF,) 50 MG/2ML injection Inject 25 mg into the muscle once a week. 03/06/22  Yes [provider]  SIMPONI 50 MG/0.5ML SOAJ Inject 50 mg into the skin every 30 (thirty) days. 01/12/22  Yes [provider]  traMADol (ULTRAM) 50 MG tablet Take by mouth every 6 (six) hours as needed.   Yes [provider]  aspirin 81 MG chewable tablet Chew 1 tablet (81 mg total) by mouth daily. Patient not taking: Reported on 06/08/2022 06/04/21   Enedina Finner, MD  Cholecalciferol (VITAMIN D3) 10 MCG (400 UNIT) tablet Take by mouth.    [provider]  clotrimazole-betamethasone (LOTRISONE) cream Apply 1 application topically daily. Patient not taking: Reported on 07/07/2021 02/16/21   Duanne Limerick, MD  DULoxetine (CYMBALTA) 30 MG capsule TAKE 1 CAPSULE BY MOUTH EVERY  DAY Patient not taking: Reported on 06/08/2022 09/05/21   Duanne Limerick, MD  ferrous sulfate 325 (65 FE) MG tablet Take by mouth. 06/19/21 06/19/22  [provider]  melatonin 3 MG TABS tablet Take by mouth. 06/17/21   [provider]  Naproxen Sodium 220 MG CAPS Aleve 220 mg capsule  prn Patient not taking: Reported on 07/07/2021    [provider]  nortriptyline (PAMELOR) 10 MG capsule Take 10 mg by mouth at bedtime. Patient not taking: Reported on 06/08/2022    [provider]  OneTouch Delica Lancets 33G MISC USE TO TEST ONCE DAILY 02/16/21   Duanne Limerick, MD  [facility] ULTRA test strip USE TO TEST ONCE DAILY 09/13/20   Duanne Limerick, MD     Critical care time: 55 minutes     Harlon Ditty, AGACNP-BC [facility] Prefer epic messenger for cross cover needs If after hours, please call E-link

## 2022-06-08 NOTE — ED Notes (Signed)
IVC/pending medical admission ?

## 2022-06-08 NOTE — Consult Note (Addendum)
PHARMACY CONSULT NOTE - FOLLOW UP  Pharmacy Consult for Electrolyte Monitoring and Replacement   Recent Labs: Potassium (mmol/L)  Date Value  06/08/2022 4.0  10/29/2011 4.0   Calcium (mg/dL)  Date Value  16/11/9602 8.7 (L)   Calcium, Total (mg/dL)  Date Value  54/10/8117 8.9   Albumin (g/dL)  Date Value  14/78/2956 3.5  02/16/2021 4.3  10/29/2011 3.8   Phosphorus (mg/dL)  Date Value  21/30/8657 2.6 (L)   Sodium (mmol/L)  Date Value  06/08/2022 139  02/16/2021 136  10/29/2011 137     Assessment: Patient admitted for multiple drugs overdose. Ethanol, APAP, and salicylate level negative. Positive for tricyclics and cannabis and benzos. Activated charcoal given. Pharmacy consulted for electrolytes replacement at part of CCM.   Goal of Therapy:  Electrolytes WNL  Plan:  Will follow up AM labs and replace electrolytes as needed  Estus Krakowski Rodriguez-Guzman PharmD, BCPS 06/08/2022 6:27 PM

## 2022-06-08 NOTE — Progress Notes (Signed)
eLink Physician-Brief Progress Note Patient Name: [name] DOB: 02-23-1965 MRN: [id number]   Date of Service  06/08/2022  HPI/Events of Note  57 yr old male with prior CVA and DM presented following intentional OD with flexeril and amitriptyline.  Now intubated.  Seizure activity witnessed.  Now laying in bed sedated and intubated.  eICU Interventions  Chart reviewed     Intervention Category Evaluation Type: New Patient Evaluation  Henry Russel, P 06/08/2022, 9:17 PM

## 2022-06-09 ENCOUNTER — Inpatient Hospital Stay: Payer: Medicare Other

## 2022-06-09 DIAGNOSIS — T50902A Poisoning by unspecified drugs, medicaments and biological substances, intentional self-harm, initial encounter: Secondary | ICD-10-CM

## 2022-06-09 DIAGNOSIS — T50902D Poisoning by unspecified drugs, medicaments and biological substances, intentional self-harm, subsequent encounter: Secondary | ICD-10-CM

## 2022-06-09 LAB — BLOOD GAS, ARTERIAL
Acid-Base Excess: 6.3 mmol/L — ABNORMAL HIGH (ref 0.0–2.0)
Acid-Base Excess: 4.2 mmol/L — ABNORMAL HIGH (ref 0.0–2.0)
Bicarbonate: 33.1 mmol/L — ABNORMAL HIGH (ref 20.0–28.0)
Bicarbonate: 28.4 mmol/L — ABNORMAL HIGH (ref 20.0–28.0)
FIO2: 50 %
MECHVT: 500 mL
Mechanical Rate: 16
O2 Saturation: 100 %
O2 Saturation: 100 %
PEEP: 5 cmH2O
Patient temperature: 37
Patient temperature: 37
pCO2 arterial: 56 mmHg — ABNORMAL HIGH (ref 32–48)
pCO2 arterial: 40 mmHg (ref 32–48)
pH, Arterial: 7.46 — ABNORMAL HIGH (ref 7.35–7.45)
pO2, Arterial: 189 mmHg — ABNORMAL HIGH (ref 83–108)

## 2022-06-09 LAB — CBC
HCT: 38 % — ABNORMAL LOW (ref 39.0–52.0)
Hemoglobin: 13.1 g/dL (ref 13.0–17.0)
MCH: 31 pg (ref 26.0–34.0)
MCHC: 34.5 g/dL (ref 30.0–36.0)
MCV: 90 fL (ref 80.0–100.0)
Platelets: 191 10*3/uL (ref 150–400)
RBC: 4.22 MIL/uL (ref 4.22–5.81)
RDW: 12.3 % (ref 11.5–15.5)
WBC: 6.8 10*3/uL (ref 4.0–10.5)
nRBC: 0 % (ref 0.0–0.2)

## 2022-06-09 LAB — GLUCOSE, CAPILLARY
Glucose-Capillary: 109 mg/dL — ABNORMAL HIGH (ref 70–99)
Glucose-Capillary: 132 mg/dL — ABNORMAL HIGH (ref 70–99)
Glucose-Capillary: 75 mg/dL (ref 70–99)
Glucose-Capillary: 80 mg/dL (ref 70–99)
Glucose-Capillary: 80 mg/dL (ref 70–99)
Glucose-Capillary: 94 mg/dL (ref 70–99)
Glucose-Capillary: 96 mg/dL (ref 70–99)

## 2022-06-09 LAB — RENAL FUNCTION PANEL
Albumin: 3.1 g/dL — ABNORMAL LOW (ref 3.5–5.0)
Anion gap: 4 — ABNORMAL LOW (ref 5–15)
BUN: 6 mg/dL (ref 6–20)
CO2: 32 mmol/L (ref 22–32)
Calcium: 8.7 mg/dL — ABNORMAL LOW (ref 8.9–10.3)
Chloride: 106 mmol/L (ref 98–111)
Creatinine, Ser: 0.66 mg/dL (ref 0.61–1.24)
GFR, Estimated: >60 mL/min (ref >60)
Glucose, Bld: 79 mg/dL (ref 70–99)
Phosphorus: 2.8 mg/dL (ref 2.5–4.6)
Potassium: 3.3 mmol/L — ABNORMAL LOW (ref 3.5–5.1)
Sodium: 142 mmol/L (ref 135–145)

## 2022-06-09 LAB — MAGNESIUM: Magnesium: 1.8 mg/dL (ref 1.7–2.4)

## 2022-06-09 LAB — HEMOGLOBIN A1C
Hgb A1c MFr Bld: 5.6 % (ref 4.8–5.6)
Mean Plasma Glucose: 114.02 mg/dL

## 2022-06-09 LAB — HIV ANTIBODY (ROUTINE TESTING W REFLEX): HIV Screen 4th Generation wRfx: NONREACTIVE

## 2022-06-09 LAB — PROCALCITONIN: Procalcitonin: <0.1 ng/mL

## 2022-06-09 MED ORDER — ORAL CARE MOUTH RINSE
15.0000 mL | OROMUCOSAL | Status: DC
  Administered 2022-06-09: 15 mL via OROMUCOSAL

## 2022-06-09 MED ORDER — POTASSIUM CHLORIDE 10 MEQ/100ML IV SOLN
10.0000 meq | INTRAVENOUS | Status: AC
  Administered 2022-06-09 (×4): 10 meq via INTRAVENOUS
  Filled 2022-06-09 (×4): qty 100

## 2022-06-09 MED ORDER — DOCUSATE SODIUM 50 MG/5ML PO LIQD: 100.0000 mg | Freq: Two times a day (BID) | ORAL | Status: DC | PRN

## 2022-06-09 MED ORDER — POTASSIUM CHLORIDE 20 MEQ PO PACK
40.0000 meq | PACK | Freq: Once | ORAL | Status: AC
  Administered 2022-06-09: 40 meq
  Filled 2022-06-09: qty 2

## 2022-06-09 MED ORDER — ASPIRIN 81 MG PO CHEW: 81.0000 mg | CHEWABLE_TABLET | Freq: Every day | ORAL | Status: DC

## 2022-06-09 MED ORDER — ORAL CARE MOUTH RINSE: 15.0000 mL | OROMUCOSAL | Status: DC | PRN

## 2022-06-09 MED ORDER — MAGNESIUM SULFATE 2 GM/50ML IV SOLN
2.0000 g | Freq: Once | INTRAVENOUS | Status: AC
  Administered 2022-06-09: 2 g via INTRAVENOUS
  Filled 2022-06-09: qty 50

## 2022-06-09 MED ORDER — LORAZEPAM 2 MG/ML IJ SOLN
2.0000 mg | INTRAMUSCULAR | Status: DC | PRN
  Administered 2022-06-09 – 2022-06-14 (×14): 2 mg via INTRAVENOUS
  Filled 2022-06-09 (×14): qty 1

## 2022-06-09 NOTE — Evaluation (Signed)
Speech Language Pathology Evaluation Patient Details Name: Antaeus Karel MRN: [id number] DOB: Jul 04, 1965 Today's Date: 06/09/2022 Time: 1345-1400 SLP Time Calculation (min) (ACUTE ONLY): 15 min  Problem List:  Patient Active Problem List   Diagnosis Date Noted   Drug overdose, intentional (HCC) 06/08/2022   Weakness due to old stroke 05/09/2022   Alcohol abuse 05/09/2022   Marijuana smoker 05/09/2022   Chronic pain syndrome 05/09/2022   Stroke (HCC) 06/01/2021   CAP (community acquired pneumonia) 06/01/2021   Hyponatremia 06/01/2021   Sepsis (HCC) 06/01/2021   Abnormal LFTs 06/01/2021   Diabetes mellitus without complication (HCC) 06/01/2021   Anxiety and depression 06/01/2021   RA (rheumatoid arthritis) (HCC) 09/13/2015   Rheumatoid arthritis (HCC) 09/13/2015   Laceration of right hand with complication 08/10/2014   Past Medical History:  Past Medical History:  Diagnosis Date   Anxiety    Arthritis    rheumatoid   Depression    Diabetes mellitus without complication (HCC)    Stroke [facility])    Past Surgical History:  Past Surgical History:  Procedure Laterality Date   TIBIA FRACTURE SURGERY Left    TONSILLECTOMY     HPI:  Lashon Hillier Sr. is a 57 y.o. male with a PMH of T2DM, RA, and prior CVA (right thalamic infarct 06/01/2021 and right basal ganglia hemorrhage 06/08/2021) who presented to [facility] ED on 05/19/2022 with reprots of worsening dysphagia. MRI revealed small infarct in the right midbrain. Plan to admit pt owever he refused. Per chart, pt presented to outside hospital in [place], [place] where he was diagnosed with aspiration pneumonia. On 06/08/2022 pt presented to [facility] ED d/t obtunded state from intentional overdose of  750 mg of amitriptyline and Flexeril. Pt intubated less than 24 hours.   Assessment / Plan / Recommendation Clinical Impression  Pt presents with moderate to severe dysarthria c/b low vocal intensity, imprecise articulation and on some occasions,  severely decreased lingual movement. Pt's vocal quality is characterized as hoarse, breathy. All of which results in decreased speech intelligibility of < 50% at the simple phrase level. Despite cues, pt unable to improve speech at this time. ST will follow while admited.    SLP Assessment  SLP Recommendation/Assessment: Patient needs continued Speech Lanaguage Pathology Services SLP Visit Diagnosis: Dysarthria and anarthria (R47.1)    Recommendations for follow up therapy are one component of a multi-disciplinary discharge planning process, led by the attending physician.  Recommendations may be updated based on patient status, additional functional criteria and insurance authorization.    Follow Up Recommendations  Follow physician's recommendations for discharge plan and follow up therapies    Assistance Recommended at Discharge  Set up Supervision/Assistance  Functional Status Assessment Patient has had a recent decline in their functional status and/or demonstrates limited ability to make significant improvements in function in a reasonable and predictable amount of time  Frequency and Duration min 2x/week  2 weeks      SLP Evaluation Cognition  Overall Cognitive Status: Impaired/Different from baseline Arousal/Alertness: Awake/alert Orientation Level: Oriented to person;Disoriented to place;Disoriented to time;Disoriented to situation Attention: Sustained Sustained Attention: Impaired Sustained Attention Impairment: Verbal basic;Functional basic Memory: Impaired Memory Impairment: Storage deficit;Retrieval deficit;Decreased recall of new information;Decreased short term memory;Prospective memory Decreased Short Term Memory: Verbal basic;Functional basic Awareness: Impaired Awareness Impairment: Intellectual impairment;Emergent impairment Problem Solving: Impaired Problem Solving Impairment: Verbal basic;Functional basic       Comprehension  Auditory Comprehension Overall  Auditory Comprehension: Impaired (d/t cognitive deficits)    Expression Expression Primary Mode  of Expression: Verbal Verbal Expression Overall Verbal Expression: Impaired   Oral / Motor  Oral Motor/Sensory Function Overall Oral Motor/Sensory Function: Generalized oral weakness Motor Speech Overall Motor Speech: Impaired Respiration: Within functional limits Phonation: Breathy;Hoarse;Low vocal intensity Articulation: Impaired Level of Impairment: Word Intelligibility: Intelligibility reduced Word: 25-49% accurate Phrase: 25-49% accurate Sentence: 0-24% accurate Conversation: 0-24% accurate Motor Planning: Impaired Level of Impairment: Word Motor Speech Errors: Unaware;Consistent Effective Techniques: Increased vocal intensity            Shondra Capps 06/09/2022, 2:32 PM

## 2022-06-09 NOTE — Consult Note (Signed)
Attempted to evaluate the client, too drowsy with only mumbled responses when attempts were made.  Psych will continue to follow.  Nanine Means, PMHNP

## 2022-06-09 NOTE — Progress Notes (Signed)
Pt extubated without complications, no stridor noted,roomair sat 95%, respiratory rate 18/min

## 2022-06-09 NOTE — Consult Note (Signed)
PHARMACY CONSULT NOTE  Pharmacy Consult for Electrolyte Monitoring and Replacement   Recent Labs: Potassium (mmol/L)  Date Value  06/09/2022 3.3 (L)  10/29/2011 4.0   Magnesium (mg/dL)  Date Value  11/91/4782 1.8   Calcium (mg/dL)  Date Value  95/62/1308 8.7 (L)   Calcium, Total (mg/dL)  Date Value  65/78/4696 8.9   Albumin (g/dL)  Date Value  29/52/8413 3.1 (L)  02/16/2021 4.3  10/29/2011 3.8   Phosphorus (mg/dL)  Date Value  24/40/1027 2.8   Sodium (mmol/L)  Date Value  06/09/2022 142  02/16/2021 136  10/29/2011 137     Assessment: Patient admitted for multiple drugs overdose. Ethanol, APAP, and salicylate level negative. Positive for tricyclics and cannabis and benzos. Activated charcoal given. Pharmacy consulted for electrolytes replacement at part of CCM.   Goal of Therapy:  Electrolytes WNL  Plan:  Hypokalemia: K+ 3.3. Kcl IV 10 meq x 4 runs ordered by medical team.  Follow up renal function panel tomorrow AM   Elliot Gurney, PharmD, BCPS Clinical Pharmacist  06/09/2022 8:06 AM

## 2022-06-09 NOTE — Evaluation (Signed)
Clinical/Bedside Swallow Evaluation Patient Details  Name: Patrick Perez MRN: [id number] Date of Birth: 1965/07/30  Today's Date: 06/09/2022 Time: SLP Start Time (ACUTE ONLY): 1330 SLP Stop Time (ACUTE ONLY): 1345 SLP Time Calculation (min) (ACUTE ONLY): 15 min  Past Medical History:  Past Medical History:  Diagnosis Date   Anxiety    Arthritis    rheumatoid   Depression    Diabetes mellitus without complication (HCC)    Stroke [facility])    Past Surgical History:  Past Surgical History:  Procedure Laterality Date   TIBIA FRACTURE SURGERY Left    TONSILLECTOMY     HPI:  Patrick Bonser Sr. is a 57 y.o. male with a PMH of T2DM, RA, and prior CVA (right thalamic infarct 06/01/2021 and right basal ganglia hemorrhage 06/08/2021) who presented to [facility] ED on 05/19/2022 with reprots of worsening dysphagia. MRI revealed small infarct in the right midbrain. Plan to admit pt owever he refused. Per chart, pt presented to outside hospital in [place], [place] where he was diagnosed with aspiration pneumonia. On 06/08/2022 pt presented to [facility] ED d/t obtunded state from intentional overdose of  750 mg of amitriptyline and Flexeril. Pt intubated less than 24 hours.    Assessment / Plan / Recommendation  Clinical Impression  Pt's girlfriend was at bedside throughout this evaluation. She describes his dysphagia as "coughing when drinking." She further states that his "first stroke (CVA 2023) didn't affect his swallow." She also states that his speech "got much better but now I can't understand him again."       Pt presents at a very high risk of aspiration given recent presentation to outside hospitals and acute infarct in midbrain. When consuming trials of nectar thick liquids via spoon and cup, pt appeared to have swift swallow response and no overt s/s of aspiration. While he consumed trials of ice chips with overt s/s of aspiration, he had the appearance of a delayed swallow accompanied by immediate throat  clear and delayed cough when consuming thin liquids via spoon. When consuming puree and soft solid/minced trials, pt was able to follow instructions to open his mouth for oral inspection of residue. Pt cleared his mouth with each trial.   Given current confused state, acute CVA and known dysphagia, recommend conservative diet of dysphagia 2 with nectar thick liquids via cup, medicine whole with puree - FULL SUPERVISION to assist with rate of consumption as pt was noted to be impulsive when consuming nectar via cup. ST will follow while inpatient with a instrumental swallow study possible. SLP Visit Diagnosis: Dysphagia, oropharyngeal phase (R13.12)    Aspiration Risk  Moderate aspiration risk;Severe aspiration risk    Diet Recommendation Dysphagia 2 (Fine chop);Nectar-thick liquid   Liquid Administration via: Cup Medication Administration: Whole meds with puree Supervision: Staff to assist with self feeding;Full supervision/cueing for compensatory strategies Compensations: Minimize environmental distractions;Slow rate;Small sips/bites Postural Changes: Seated upright at 90 degrees    Other  Recommendations Oral Care Recommendations: Oral care BID Caregiver Recommendations: Remove water pitcher;Avoid jello, ice cream, thin soups, popsicles    Recommendations for follow up therapy are one component of a multi-disciplinary discharge planning process, led by the attending physician.  Recommendations may be updated based on patient status, additional functional criteria and insurance authorization.  Follow up Recommendations Follow physician's recommendations for discharge plan and follow up therapies         Functional Status Assessment Patient has had a recent decline in their functional status and/or demonstrates limited ability to  make significant improvements in function in a reasonable and predictable amount of time  Frequency and Duration min 2x/week  2 weeks       Prognosis  Prognosis for improved oropharyngeal function: Fair Barriers to Reach Goals: Cognitive deficits;Motivation;Time post onset;Severity of deficits      Swallow Study   General Date of Onset: 06/08/22 HPI: Patrick Perez. is a 57 y.o. male with a PMH of T2DM, RA, and prior CVA (right thalamic infarct 06/01/2021 and right basal ganglia hemorrhage 06/08/2021) who presented to [facility] ED on 05/19/2022 with reprots of worsening dysphagia. MRI revealed small infarct in the right midbrain. Plan to admit pt owever he refused. Per chart, pt presented to outside hospital in [place], [place] where he was diagnosed with aspiration pneumonia. On 06/08/2022 pt presented to [facility] ED d/t obtunded state from intentional overdose of  750 mg of amitriptyline and Flexeril. Pt intubated less than 24 hours. Type of Study: Bedside Swallow Evaluation Previous Swallow Assessment: none in chart Diet Prior to this Study: NPO Temperature Spikes Noted: No Respiratory Status: Room air History of Recent Intubation: Yes Total duration of intubation (days):  (< 1 day) Date extubated: 06/09/22 Behavior/Cognition: Alert Oral Cavity Assessment: Dry Oral Care Completed by SLP: Recent completion by staff Oral Cavity - Dentition: Adequate natural dentition Self-Feeding Abilities: Total assist;Needs assist Patient Positioning: Upright in bed Baseline Vocal Quality: Breathy;Hoarse;Low vocal intensity Volitional Cough: Cognitively unable to elicit Volitional Swallow: Able to elicit    Oral/Motor/Sensory Function Overall Oral Motor/Sensory Function: Generalized oral weakness (unable to formally assess d/t mentation)   Ice Chips Ice chips: Within functional limits Presentation: Spoon   Thin Liquid Thin Liquid: Impaired Presentation: Spoon Pharyngeal  Phase Impairments: Multiple swallows;Suspected delayed Swallow;Throat Clearing - Immediate;Cough - Delayed    Nectar Thick Nectar Thick Liquid: Within functional limits Presentation:  Cup;Spoon   Honey Thick Honey Thick Liquid: Not tested   Puree Puree: Within functional limits Presentation: Spoon   Solid     Solid: Impaired Oral Phase Impairments: Reduced lingual movement/coordination Oral Phase Functional Implications: Prolonged oral transit;Impaired mastication     Kambrie Eddleman B. Dreama Saa, M.S., CCC-SLP, Tree surgeon Certified Brain Injury Specialist [facility]  [facility] Rehabilitation Services Office [phone] Ascom [phone] Fax [phone]

## 2022-06-09 NOTE — Progress Notes (Signed)
NAME:  Patrick Perez, MRN:  [id number], DOB:  April 01, 1965, LOS: 1 ADMISSION DATE:  06/08/2022, CONSULTATION DATE:  06/08/22 REFERRING MD:  Dr. Roxan Hockey, CHIEF COMPLAINT:  Intentional drug overdose   Brief Pt Description / Synopsis:  57 y.o. Male with PMHx significant for stroke with left-sided hemiparesis and depression, who is admitted with Intentional Drug Overdose (Amitriptyline and Flexeril) requiring intubation for airway protection.  EMS reported seizure like activity.  History of Present Illness:  Patrick Perez is a 57 year old male with a past medical history significant for prior CVA (right thalamic infarct 06/01/2021 and right basal ganglia hemorrhage 06/08/2021) with left-sided hemiparesis, dysphagia, rheumatoid arthritis, type 2 diabetes mellitus who presents to [facility] ED on 06/08/2022 due to intentional drug overdose.  Patient is currently intubated and sedated, therefore history is obtained from patient's significant other at bedside along with chart review.  Patient's significant other at bedside reports that the patient called her and his mother today telling them that he had taken too many medications intentionally.  She reports that he has been depressed ever since his stroke approximately 1 year ago, and has refused to get established with neurology or to work with PT/OT.  Per EMS the patient was found to have taken amitriptyline and Flexeril 1-2 hours prior to presentation, and they also noted seizure-like activity.  He was given 2 mg of Versed and 2 A of sodium bicarb due to a prolonged QRS.  Upon arrival to the ED he was obtunded with spontaneous respirations, and decision was made by ED provider to intubate for airway protection.  Of note he was recently seen at [facility] campus on 05/19/2022 for progressive dysphagia and slurred speech over several days.  MRI of the brain was concerning for a small infarction in the right midbrain.  He refused admission by neurology.  He was to get  established with neurology outpatient, and decision was made to take him off Plavix.  ED Course: Initial Vital Signs: Blood pressure 150/102, pulse 120, respiratory 18, SpO2 100% on the vent Significant Labs: Glucose 141, serum acetaminophen less than 10, salicylates less than 7 VBG: pH 7.55/pCO2 38/pO2 81/bicarb 33.2 Imaging Chest X-ray>>FINDINGS: Chest x-ray: ET tube seen with the tip measured 5.8 cm above the carina. Underinflation with some interstitial and lung base mild parenchymal opacities. No pneumothorax, effusion or consolidation. Normal cardiopericardial silhouette. Overlapping cardiac leads. KUB>>Abdominal x-ray: Single limited view of the upper abdomen demonstrates enteric tube with tip of the level of the stomach with side hole of the GE junction. Recommend this be advanced further into the stomach. CT Head w/o contrast>>IMPRESSION: 1. Encephalomalacia and brain parenchymal volume loss at the site of prior right thalamus/right basal ganglia infarct. 2. Area of hypodensity extends from the previously infarcted parenchyma to the right pons and appears larger than on patient's prior CT. This may represent an area of acute/subacute infarction. Consider further evaluation with MRI. 3. No evidence of acute intracranial hemorrhage. Medications Administered: 2 Amps bicarb, activated charcoal given via NG, fentanyl and Versed infusion started  PCCM asked to admit for further workup and treatment.  Please see "significant hospital events" section below for full detailed hospital course.   Pertinent  Medical History   Past Medical History:  Diagnosis Date   Anxiety    Arthritis    rheumatoid   Depression    Diabetes mellitus without complication (HCC)    Stroke (HCC)      Micro Data:  N/A  Antimicrobials:  N/A  Significant Hospital  Events: Including procedures, antibiotic start and stop dates in addition to other pertinent events   4/25: Presented to ED  following intentional drug overdose.  Required intubation in the ED for airway protection.  PCCM asked to admit. 4/26: MRI Brain>> IMPRESSION: 1. 7 mm focus of mild diffusion abnormality involving the right paramedian midbrain, likely reflecting an evolving subacute small vessel infarct. No associated hemorrhage or mass effect. 2. Chronic encephalomalacia and gliosis involving the right basal ganglia/external capsule, likely reflecting changes of prior hemorrhagic infarct. Associated wallerian degeneration.3. Additional small remote lacunar infarct at the right thalamus. 4/26: On minimal vent settings. Successfully EXTUBATED.  IVC, Safety sitter at bedside, Psych consulted.  Interim History / Subjective:  -No significant events noted overnight -Afebrile, hemodynamically stable, no vasopressors -Latest QRS duration 0.1  -On minimal vent support ~ will perform WUA and SBT as mental status permits ~ successfully EXTUBATED -Safety sitter now at bedside now that off sedation ~ Psych consulted -MRI brain this morning concerning for evolving subacute infarct ~ will assess neuro status now that extubated to assess for additional deficits above baseline with low threshold for Neuro consult  Objective   Blood pressure (!) 85/69, pulse 81, temperature 98.1 F (36.7 C), resp. rate 16, height 5\' 7"  (1.702 m), weight 84 kg, SpO2 100 %.    Vent Mode: PRVC FiO2 (%):  [40 %-50 %] 40 % Set Rate:  [16 bmp] 16 bmp Vt Set:  [500 mL] 500 mL PEEP:  [5 cmH20] 5 cmH20   Intake/Output Summary (Last 24 hours) at 06/09/2022 0740 Last data filed at 06/09/2022 0400 Gross per 24 hour  Intake 66.98 ml  Output 425 ml  Net -358.02 ml   Filed Weights   06/08/22 2021 06/09/22 0500  Weight: 81.4 kg 84 kg    Examination: General: Acute on chronically ill-appearing male, laying in bed, intubated and sedated, no acute distress HENT: Atraumatic, normocephalic, neck supple, no JVD, orally intubated Lungs: Clear breath  sounds throughout, no wheezing or rales noted, even, synchronous with the vent Cardiovascular: Regular rate and rhythm, S1-S2, no murmurs, rubs, gallops Abdomen: Soft, nontender, nondistended, no guarding or rebound tenderness, bowel sounds positive x 4 Extremities: Normal bulk and tone, no deformities, no edema Neuro: Sedated, currently does not withdraw or follow commands (does have a history of left-sided hemiparesis from prior stroke), pupils PERRLA and sluggish at 3 mm bilaterally GU: Foley catheter in place draining yellow urine  Resolved Hospital Problem list     Assessment & Plan:   #Intubated for airway protection in setting of drug overdose EXTUBATED 06/07/22 -Supplemental O2 as needed to maintain O2 sats >92% -Follow intermittent Chest X-ray & ABG as needed -Bronchodilators prn -Pulmonary toilet as able -Mobilize when able  #Intentional Drug Overdose (reported that pt took Amitriptyline and Flexaril) -Continuous cardiac monitoring and intermittent EKG's -Bicarb as needed for QRS duration >185ms  -Provide supportive care -Maintain MAP >65 -IV fluids -Vasopressors as needed to maintain MAP goal -Monitor I&O's / urinary output -Follow BMP -Ensure adequate renal perfusion -Avoid nephrotoxic agents as able -Replace electrolytes as indicated  #Diabetes Mellitus Type II  Hgb A1c is 5.6 on 06/09/22  -CBG's q4h; Target range of 140 to 180 -SSI -Follow ICU Hypo/Hyperglycemia protocol  #Acute Metabolic Encephalopathy due to drug overdose #Seizure-like activity (witnessed by EMS) PMHx: Stroke (right thalamic infarct 06/01/2021 and right basal ganglia hemorrhage 06/08/2021), PTSD, Depression MRI Brain 4/26 concerning for evolving subacute small vessel infarct of the right paramedian midbrain, no associated hemorrhage  or mass effect -IVC -Seizure & Suicide precautions -Avoid sedating medications as able -Prn Benzo's for seizure -EEG is pending -Low threshold for Neuro consult  (has not established with Neurology since previous strokes) ~ will evaluate for any new deficits above baseline deficits now that is extubated -Now that he is extubated, will consult Psych      Best Practice (right click and "Reselect all SmartList Selections" daily)   Diet/type: NPO, pending bedside swallow evaluation DVT prophylaxis: LMWH GI prophylaxis: PPI Lines: N/A Foley:  Yes, and it is still needed, can remove once extubated Code Status:  full code Last date of multidisciplinary goals of care discussion [4/26]  4/26: Will update pt's family when they arrive at bedside.  Labs   CBC: Recent Labs  Lab 06/08/22 1535 06/09/22 0520  WBC 5.5 6.8  NEUTROABS 2.8  --   HGB 14.4 13.1  HCT 42.0 38.0*  MCV 89.6 90.0  PLT 218 191     Basic Metabolic Panel: Recent Labs  Lab 06/08/22 1535 06/08/22 2053 06/09/22 0520  NA 139 140 142  K 4.0 4.2 3.3*  CL 104 107 106  CO2 26 23 32  GLUCOSE 141* 124* 79  BUN 7 6 6   CREATININE 0.66 0.60* 0.66  CALCIUM 8.7* 8.9 8.7*  MG  --  2.0 1.8  PHOS  --  3.6 2.8    GFR: Estimated Creatinine Clearance: 108.2 mL/min (by C-G formula based on SCr of 0.66 mg/dL). Recent Labs  Lab 06/08/22 1535 06/08/22 2053 06/09/22 0520  PROCALCITON  --  <0.10 <0.10  WBC 5.5  --  6.8     Liver Function Tests: Recent Labs  Lab 06/08/22 1535 06/08/22 2053 06/09/22 0520  AST 31  --   --   ALT 20  --   --   ALKPHOS 79  --   --   BILITOT 0.8  --   --   PROT 7.2  --   --   ALBUMIN 3.5 3.5 3.1*    No results for input(s): "LIPASE", "AMYLASE" in the last 168 hours. No results for input(s): "AMMONIA" in the last 168 hours.  ABG    Component Value Date/Time   PHART 7.46 (H) 06/09/2022 0500   PCO2ART 40 06/09/2022 0500   PO2ART 189 (H) 06/09/2022 0500   HCO3 28.4 (H) 06/09/2022 0500   O2SAT 100 06/09/2022 0500     Coagulation Profile: No results for input(s): "INR", "PROTIME" in the last 168 hours.  Cardiac Enzymes: No results  for input(s): "CKTOTAL", "CKMB", "CKMBINDEX", "TROPONINI" in the last 168 hours.  HbA1C: Hemoglobin A1C  Date/Time Value Ref Range Status  07/09/2020 12:00 AM 10.9  Final   Hgb A1c MFr Bld  Date/Time Value Ref Range Status  06/01/2021 11:15 AM 6.4 (H) 4.8 - 5.6 % Final    Comment:    (NOTE) Pre diabetes:          5.7%-6.4%  Diabetes:              >6.4%  Glycemic control for   <7.0% adults with diabetes   02/16/2021 10:01 AM 7.1 (H) 4.8 - 5.6 % Final    Comment:             Prediabetes: 5.7 - 6.4          Diabetes: >6.4          Glycemic control for adults with diabetes: <7.0     CBG: Recent Labs  Lab 06/08/22 2018 06/09/22 0001  06/09/22 0311  GLUCAP 269* 109* 75    Review of Systems:   Unable to assess due to AMS/intubation/sedation   Past Medical History:  He,  has a past medical history of Anxiety, Arthritis, Depression, Diabetes mellitus without complication (HCC), and Stroke (HCC).   Surgical History:   Past Surgical History:  Procedure Laterality Date   TIBIA FRACTURE SURGERY Left    TONSILLECTOMY       Social History:   reports that he quit smoking about 2 years ago. His smoking use included cigarettes. He has a 13.50 pack-year smoking history. He has never used smokeless tobacco. He reports current alcohol use of about 2.0 standard drinks of alcohol per week. He reports current drug use. Frequency: 7.00 times per week. Drug: Marijuana.   Family History:  His family history includes Cancer in his father and paternal grandfather; Diabetes in his mother.   Allergies Allergies  Allergen Reactions   Oxycodone Hives   Oxycodone-Acetaminophen Anxiety   Quetiapine Rash     Home Medications  Prior to Admission medications   Medication Sig Start Date End Date Taking? Authorizing Provider  amitriptyline (ELAVIL) 25 MG tablet Take 25 mg by mouth at bedtime.   Yes [provider]  atorvastatin (LIPITOR) 80 MG tablet TAKE 1 TABLET BY MOUTH EVERY  DAY 08/25/21  Yes Duanne Limerick, MD  carvedilol (COREG) 6.25 MG tablet Take 6.25 mg by mouth 2 (two) times daily with a meal. 04/11/22  Yes [provider]  clopidogrel (PLAVIX) 75 MG tablet TAKE 1 TABLET BY MOUTH EVERY DAY 09/05/21  Yes Duanne Limerick, MD  cyclobenzaprine (FLEXERIL) 10 MG tablet Take 10 mg by mouth 3 (three) times daily as needed for muscle spasms.   Yes [provider]  folic acid (FOLVITE) 1 MG tablet Take by mouth. 01/20/13  Yes [provider]  gabapentin (NEURONTIN) 300 MG capsule Take 1 capsule (300 mg total) by mouth at bedtime. Dr Alfredo Batty Patient taking differently: Take 300 mg by mouth 3 (three) times daily. Dr Alfredo Batty 06/03/21  Yes Enedina Finner, MD  metFORMIN (GLUCOPHAGE) 500 MG tablet TAKE 1 TABLET BY MOUTH 2 TIMES DAILY WITH A MEAL. 08/18/21  Yes Duanne Limerick, MD  Methotrexate Sodium (METHOTREXATE, PF,) 50 MG/2ML injection Inject 25 mg into the muscle once a week. 03/06/22  Yes [provider]  SIMPONI 50 MG/0.5ML SOAJ Inject 50 mg into the skin every 30 (thirty) days. 01/12/22  Yes [provider]  traMADol (ULTRAM) 50 MG tablet Take by mouth every 6 (six) hours as needed.   Yes [provider]  aspirin 81 MG chewable tablet Chew 1 tablet (81 mg total) by mouth daily. Patient not taking: Reported on 06/08/2022 06/04/21   Enedina Finner, MD  Cholecalciferol (VITAMIN D3) 10 MCG (400 UNIT) tablet Take by mouth.    [provider]  clotrimazole-betamethasone (LOTRISONE) cream Apply 1 application topically daily. Patient not taking: Reported on 07/07/2021 02/16/21   Duanne Limerick, MD  DULoxetine (CYMBALTA) 30 MG capsule TAKE 1 CAPSULE BY MOUTH EVERY DAY Patient not taking: Reported on 06/08/2022 09/05/21   Duanne Limerick, MD  ferrous sulfate 325 (65 FE) MG tablet Take by mouth. 06/19/21 06/19/22  [provider]  melatonin 3 MG TABS tablet Take by mouth. 06/17/21   [provider]  Naproxen Sodium 220 MG  CAPS Aleve 220 mg capsule  prn Patient not taking: Reported on 07/07/2021    [provider]  nortriptyline (PAMELOR) 10  MG capsule Take 10 mg by mouth at bedtime. Patient not taking: Reported on 06/08/2022    [provider]  OneTouch Delica Lancets 33G MISC USE TO TEST ONCE DAILY 02/16/21   Duanne Limerick, MD  [facility] ULTRA test strip USE TO TEST ONCE DAILY 09/13/20   Duanne Limerick, MD     Critical care time: 40 minutes     Harlon Ditty, AGACNP-BC [facility] Prefer epic messenger for cross cover needs If after hours, please call E-link

## 2022-06-09 NOTE — Progress Notes (Signed)
Pt successfully extubated per order, MD and NP at bedside, pt has sitter present, no distress noted, pt tolerating room air well.

## 2022-06-09 NOTE — Progress Notes (Signed)
RT transported pt from ICU 7 to MRI via vent and back to ICU 7 without incident.

## 2022-06-09 NOTE — Progress Notes (Signed)
Palliative consult received.  Spoke with CCM team who is in agreement that Palliative Consult not appropriate at this time due to reasoning for admission. Consult cancelled.  No Charge.  Leeanne Deed, DNP, AGNP-C Palliative Medicine  Please call Palliative Medicine team phone with any questions [phone]. For individual providers please see AMION.

## 2022-06-09 NOTE — Progress Notes (Signed)
Pt's significant other updated at bedside regarding plan of care.  All questions answered.   Felicidad Sugarman, AGACNP-BC  Pulmonary & Critical Care Prefer epic messenger for cross cover needs If after hours, please call E-link   

## 2022-06-10 DIAGNOSIS — I639 Cerebral infarction, unspecified: Secondary | ICD-10-CM | POA: Diagnosis not present

## 2022-06-10 DIAGNOSIS — T50902S Poisoning by unspecified drugs, medicaments and biological substances, intentional self-harm, sequela: Secondary | ICD-10-CM

## 2022-06-10 DIAGNOSIS — T50902D Poisoning by unspecified drugs, medicaments and biological substances, intentional self-harm, subsequent encounter: Secondary | ICD-10-CM | POA: Diagnosis not present

## 2022-06-10 DIAGNOSIS — R4182 Altered mental status, unspecified: Secondary | ICD-10-CM | POA: Diagnosis not present

## 2022-06-10 LAB — GLUCOSE, CAPILLARY
Glucose-Capillary: 113 mg/dL — ABNORMAL HIGH (ref 70–99)
Glucose-Capillary: 109 mg/dL — ABNORMAL HIGH (ref 70–99)
Glucose-Capillary: 111 mg/dL — ABNORMAL HIGH (ref 70–99)
Glucose-Capillary: 110 mg/dL — ABNORMAL HIGH (ref 70–99)
Glucose-Capillary: 141 mg/dL — ABNORMAL HIGH (ref 70–99)

## 2022-06-10 LAB — CBC
HCT: 40.6 % (ref 39.0–52.0)
Hemoglobin: 14 g/dL (ref 13.0–17.0)
MCH: 30.4 pg (ref 26.0–34.0)
MCHC: 34.5 g/dL (ref 30.0–36.0)
MCV: 88.1 fL (ref 80.0–100.0)
Platelets: 189 10*3/uL (ref 150–400)
RBC: 4.61 MIL/uL (ref 4.22–5.81)
RDW: 12.1 % (ref 11.5–15.5)
WBC: 8.4 10*3/uL (ref 4.0–10.5)
nRBC: 0 % (ref 0.0–0.2)

## 2022-06-10 LAB — RENAL FUNCTION PANEL
Albumin: 3.4 g/dL — ABNORMAL LOW (ref 3.5–5.0)
Anion gap: 8 (ref 5–15)
BUN: <5 mg/dL — ABNORMAL LOW (ref 6–20)
CO2: 24 mmol/L (ref 22–32)
Calcium: 8.7 mg/dL — ABNORMAL LOW (ref 8.9–10.3)
Chloride: 106 mmol/L (ref 98–111)
Creatinine, Ser: 0.67 mg/dL (ref 0.61–1.24)
GFR, Estimated: >60 mL/min (ref >60)
Glucose, Bld: 113 mg/dL — ABNORMAL HIGH (ref 70–99)
Phosphorus: 3.4 mg/dL (ref 2.5–4.6)
Potassium: 3.9 mmol/L (ref 3.5–5.1)
Sodium: 138 mmol/L (ref 135–145)

## 2022-06-10 MED ORDER — MORPHINE SULFATE (PF) 2 MG/ML IV SOLN
2.0000 mg | INTRAVENOUS | Status: AC | PRN
  Administered 2022-06-10 – 2022-06-12 (×8): 2 mg via INTRAVENOUS
  Filled 2022-06-10 (×8): qty 1

## 2022-06-10 MED ORDER — DEXTROSE-NACL 5-0.9 % IV SOLN: INTRAVENOUS | Status: DC

## 2022-06-10 MED ORDER — DIAZEPAM 5 MG/ML IJ SOLN
2.5000 mg | Freq: Once | INTRAMUSCULAR | Status: AC
  Administered 2022-06-10: 2.5 mg via INTRAVENOUS
  Filled 2022-06-10: qty 2

## 2022-06-10 NOTE — Plan of Care (Signed)
Continuing with plan of care. 

## 2022-06-10 NOTE — Progress Notes (Signed)
Progress Note   Patient: Patrick Perez ZOX:[id number] DOB: Jul 14, 1965 DOA: 06/08/2022     2 DOS: the patient was seen and examined on 06/10/2022   Brief hospital course:   57 year old male with a past medical history significant for prior CVA (right thalamic infarct 06/01/2021 and right basal ganglia hemorrhage 06/08/2021) with left-sided hemiparesis, dysphagia, rheumatoid arthritis, type 2 diabetes mellitus who presents to [facility] ED on 06/08/2022 due to intentional drug overdose.  Patient is currently intubated and sedated, therefore history is obtained from patient's significant other at bedside along with chart review.   Patient's significant other at bedside reports that the patient called her and his mother today telling them that he had taken too many medications intentionally.  She reports that he has been depressed ever since his stroke approximately 1 year ago, and has refused to get established with neurology or to work with PT/OT.  Per EMS the patient was found to have taken amitriptyline and Flexeril 1-2 hours prior to presentation, and they also noted seizure-like activity.  He was given 2 mg of Versed and 2 A of sodium bicarb due to a prolonged QRS.  Upon arrival to the ED he was obtunded with spontaneous respirations, and decision was made by ED provider to intubate for airway protection.   Of note he was recently seen at [facility] campus on 05/19/2022 for progressive dysphagia and slurred speech over several days.  MRI of the brain was concerning for a small infarction in the right midbrain.  He refused admission by neurology.  He was to get established with neurology outpatient, and decision was made to take him off Plavix.  4/25: Presented to ED following intentional drug overdose.  Required intubation in the ED for airway protection.  PCCM asked to admit.  4/26: MRI Brain showed  1. 7 mm focus of mild diffusion abnormality involving the right paramedian midbrain, likely reflecting an  evolving subacute small vessel infarct. No associated hemorrhage or mass effect. 2. Chronic encephalomalacia and gliosis involving the right basal ganglia/external capsule, likely reflecting changes of prior hemorrhagic infarct. Associated wallerian degeneration.3. Additional small remote lacunar infarct at the right thalamus.  4/26: On minimal vent settings. Successfully EXTUBATED.  IVC, Safety sitter at bedside, Psych consulted.  4/27 : Patient continues to stay extubated saturations normal vitally stable.  Neurology and psychiatry consulted.  Recommendations pending.  Assessment and Plan:  Intentional Drug Overdose - Amitriptyline and Flexeril , patient was intubated for airway protection in the setting of overdose was extubated on 4/24. -Continuous cardiac monitoring and intermittent EKG's -Bicarb as needed for QRS duration >144ms  -Provide supportive care -Maintain MAP >65 -IV fluids -Vasopressors as needed to maintain MAP goal -Monitor I&O's / urinary output -Follow BMP -Ensure adequate renal perfusion -Avoid nephrotoxic agents as able -Replace electrolytes as indicated  #Acute Metabolic Encephalopathy due to drug overdose #Seizure-like activity (witnessed by EMS) PMHx: Stroke (right thalamic infarct 06/01/2021 and right basal ganglia hemorrhage 06/08/2021), PTSD, Depression MRI Brain 4/26 concerning for evolving subacute small vessel infarct of the right paramedian midbrain, no associated hemorrhage or mass effect -IVC -Seizure & Suicide precautions -Avoid sedating medications as able -Prn Benzo's for seizure -EEG is pending -Low threshold for Neuro consult (has not established with Neurology since previous strokes) ~ will evaluate for any new deficits above baseline deficits now that is extubated -Now that he is extubated, will consult Psych  #Diabetes Mellitus Type II  Hgb A1c is 5.6 on 06/09/22  -CBG's q4h; Target range of  140 to 180 -SSI -Follow ICU Hypo/Hyperglycemia  protocol     Subjective: Patient seen and examined this morning.  Nursing ports that patient has been restless morning in pain.  Vital labs and imaging reviewed.  Patient vitally stable , saturations have been normal postextubation.  Protecting his airway.  Labs within reference range.  No new imaging.  physical Exam: Vitals:   06/10/22 0200 06/10/22 0400 06/10/22 0500 06/10/22 0700  BP: (!) 129/93 (!) 136/99    Pulse: (!) 120 (!) 118  (!) 106  Resp: (!) 25 (!) 21  17  Temp: 99.3 F (37.4 C) 99.1 F (37.3 C)    TempSrc: Oral Axillary    SpO2: 98% 94%  98%  Weight:   80.1 kg   Height:       Physical Exam Constitutional:      Comments: Somnolent with intermittent restlessness.  HENT:     Head: Normocephalic and atraumatic.     Nose: Nose normal.     Mouth/Throat:     Mouth: Mucous membranes are dry.  Eyes:     Extraocular Movements: Extraocular movements intact.     Pupils: Pupils are equal, round, and reactive to light.  Cardiovascular:     Rate and Rhythm: Tachycardia present.     Pulses: Normal pulses.     Heart sounds: No murmur heard. Pulmonary:     Comments: On 2 L nasal cannula Abdominal:     Palpations: Abdomen is soft.  Musculoskeletal:        General: Normal range of motion.     Cervical back: Normal range of motion.  Skin:    General: Skin is warm.  Neurological:     Comments: Patient in mild sedation.  Follows command but mumbling with dysarthria.  On comprehensible.  Has residual left-sided weakness     Data Reviewed:  There are no new results to review at this time.  Family Communication: None by bedside, nurse updated the girlfriend prior to my arrival.  Disposition: Status is: Inpatient Remains inpatient appropriate because: Drug overdose  Planned Discharge Destination: Rehab    Time spent: 32 minutes  Author: Kirstie Peri, MD 06/10/2022 8:10 AM  For on call review [email or web address].

## 2022-06-10 NOTE — Progress Notes (Addendum)
Dr. Lucianne Muss at bedside to assess patient, verbal order received for valium 2.5mg  IV once, change IV fluids to D5 NS from NS at , and make patient NPO at this time due being lethargic.

## 2022-06-10 NOTE — Consult Note (Signed)
The client was sleeping soundly with his mouth open on attempted assessment.  The sitter at his bedside reported he has been like this most of the morning.  Psych will continue to follow as he will need a psych admission once he is medically cleared r/t his intentional overdose.  Nanine Means, PMHNP

## 2022-06-10 NOTE — Progress Notes (Addendum)
SLP Cancellation Note  Patient Details Name: Patrick Perez MRN: [id number] DOB: April 03, 1965   Cancelled treatment:       Reason Eval/Treat Not Completed: Fatigue/lethargy limiting ability to participate;Medical issues which prohibited therapy;Patient not medically ready;Patient's level of consciousness (chart reviewed; consulted NSG/MD re: pt's status)  Pt is currently lethargic w/ loud snoring; intermittent, involuntary movements in the bed. Per chart notes, pt moved into a more lethargic State (starting yesterday PM) and has been too lethargic for safe oral intake including meds. NSG did not offer dinner meal last night nor breakfast meals this AM. Per NSG, pt is receiving sedating meds/Valium. He was also recently extubated post admit for intentional overdose and has neuro decline/changes since recent/prior CVA: per MRI, "7 mm focus of mild diffusion abnormality involving the right paramedian midbrain, likely reflecting an evolving subacute small vessel infarct. No associated hemorrhage or mass effect.  2. Chronic encephalomalacia and gliosis involving the right basal ganglia/external capsule, likely reflecting changes of prior hemorrhagic infarct.".   MD agreed w/ NPO status; placed order. Psychiatry continues to make attempts to assess. Recommend frequent oral care for hygiene and stimulation of swallowing while NPO. ST services will f/u w/ ongoing assessment of swallowing and safety of oral diet next 1-2 days. Recommend hold on any speech-language therapy until pt can be fully alert/awake to participate, and when sedating medications are not being given.      Jerilynn Som, MS, CCC-SLP Speech Language Pathologist Rehab Services; [facility] [phone] (ascom) Yahya Boldman 06/10/2022, 2:02 PM

## 2022-06-11 ENCOUNTER — Other Ambulatory Visit: Payer: Medicare Other

## 2022-06-11 DIAGNOSIS — R4182 Altered mental status, unspecified: Secondary | ICD-10-CM | POA: Diagnosis not present

## 2022-06-11 DIAGNOSIS — I639 Cerebral infarction, unspecified: Secondary | ICD-10-CM | POA: Diagnosis not present

## 2022-06-11 DIAGNOSIS — T50902S Poisoning by unspecified drugs, medicaments and biological substances, intentional self-harm, sequela: Secondary | ICD-10-CM | POA: Diagnosis not present

## 2022-06-11 LAB — COMPREHENSIVE METABOLIC PANEL
ALT: 14 U/L (ref 0–44)
AST: 15 U/L (ref 15–41)
Albumin: 3.3 g/dL — ABNORMAL LOW (ref 3.5–5.0)
Alkaline Phosphatase: 83 U/L (ref 38–126)
Anion gap: 7 (ref 5–15)
BUN: <5 mg/dL — ABNORMAL LOW (ref 6–20)
CO2: 24 mmol/L (ref 22–32)
Calcium: 8.9 mg/dL (ref 8.9–10.3)
Chloride: 106 mmol/L (ref 98–111)
Creatinine, Ser: 0.56 mg/dL — ABNORMAL LOW (ref 0.61–1.24)
GFR, Estimated: >60 mL/min (ref >60)
Glucose, Bld: 118 mg/dL — ABNORMAL HIGH (ref 70–99)
Potassium: 3.4 mmol/L — ABNORMAL LOW (ref 3.5–5.1)
Sodium: 137 mmol/L (ref 135–145)
Total Bilirubin: 0.8 mg/dL (ref 0.3–1.2)
Total Protein: 6.8 g/dL (ref 6.5–8.1)

## 2022-06-11 LAB — CBC
HCT: 39.1 % (ref 39.0–52.0)
Hemoglobin: 13.6 g/dL (ref 13.0–17.0)
MCH: 30.2 pg (ref 26.0–34.0)
MCHC: 34.8 g/dL (ref 30.0–36.0)
MCV: 86.9 fL (ref 80.0–100.0)
Platelets: 198 10*3/uL (ref 150–400)
RBC: 4.5 MIL/uL (ref 4.22–5.81)
RDW: 11.9 % (ref 11.5–15.5)
WBC: 7.5 10*3/uL (ref 4.0–10.5)
nRBC: 0 % (ref 0.0–0.2)

## 2022-06-11 LAB — GLUCOSE, CAPILLARY
Glucose-Capillary: 121 mg/dL — ABNORMAL HIGH (ref 70–99)
Glucose-Capillary: 110 mg/dL — ABNORMAL HIGH (ref 70–99)
Glucose-Capillary: 130 mg/dL — ABNORMAL HIGH (ref 70–99)
Glucose-Capillary: 86 mg/dL (ref 70–99)
Glucose-Capillary: 102 mg/dL — ABNORMAL HIGH (ref 70–99)

## 2022-06-11 LAB — MAGNESIUM: Magnesium: 1.8 mg/dL (ref 1.7–2.4)

## 2022-06-11 MED ORDER — POTASSIUM CHLORIDE 10 MEQ/100ML IV SOLN
10.0000 meq | INTRAVENOUS | Status: AC
  Administered 2022-06-11 (×3): 10 meq via INTRAVENOUS
  Filled 2022-06-11 (×3): qty 100

## 2022-06-11 NOTE — Progress Notes (Signed)
. Progress Note   Patient: Patrick Perez WUJ:[id number] DOB: 04/06/1965 DOA: 06/08/2022     3 DOS: the patient was seen and examined on 06/11/2022   Brief hospital course:   57 year old male with a past medical history significant for prior CVA (right thalamic infarct 06/01/2021 and right basal ganglia hemorrhage 06/08/2021) with left-sided hemiparesis, dysphagia, rheumatoid arthritis, type 2 diabetes mellitus who presents to [facility] ED on 06/08/2022 due to intentional drug overdose.  Patient is currently intubated and sedated, therefore history is obtained from patient's significant other at bedside along with chart review.   Patient's significant other at bedside reports that the patient called her and his mother today telling them that he had taken too many medications intentionally.  She reports that he has been depressed ever since his stroke approximately 1 year ago, and has refused to get established with neurology or to work with PT/OT.  Per EMS the patient was found to have taken amitriptyline and Flexeril 1-2 hours prior to presentation, and they also noted seizure-like activity.  He was given 2 mg of Versed and 2 A of sodium bicarb due to a prolonged QRS.  Upon arrival to the ED he was obtunded with spontaneous respirations, and decision was made by ED provider to intubate for airway protection.   Of note he was recently seen at [facility] campus on 05/19/2022 for progressive dysphagia and slurred speech over several days.  MRI of the brain was concerning for a small infarction in the right midbrain.  He refused admission by neurology.  He was to get established with neurology outpatient, and decision was made to take him off Plavix.  4/25: Presented to ED following intentional drug overdose.  Required intubation in the ED for airway protection.  PCCM asked to admit.  4/26: MRI Brain showed  1. 7 mm focus of mild diffusion abnormality involving the right paramedian midbrain, likely reflecting an  evolving subacute small vessel infarct. No associated hemorrhage or mass effect. 2. Chronic encephalomalacia and gliosis involving the right basal ganglia/external capsule, likely reflecting changes of prior hemorrhagic infarct. Associated wallerian degeneration.3. Additional small remote lacunar infarct at the right thalamus.  4/26: On minimal vent settings. Successfully EXTUBATED.  IVC, Safety sitter at bedside, Psych consulted.  4/27 : Patient continues to stay extubated saturations normal vitally stable.  Neurology and psychiatry consulted.  Recommendations pending.  4/28: Patient continues to stay intermittently restless.  Has been receiving around-the-clock Ativan and morphine.  Neurology evaluated recommendations appreciated.  Psych still to eval the patient.  Assessment and Plan:  Intentional Drug Overdose - Amitriptyline and Flexeril , patient was intubated for airway protection in the setting of overdose was extubated on 4/24. -Continuous cardiac monitoring and intermittent EKG's -Bicarb as needed for QRS duration >135ms  -Provide supportive care -Maintain MAP >65 -IV fluids -Vasopressors as needed to maintain MAP goal -Monitor I&O's / urinary output -Follow BMP -Ensure adequate renal perfusion -Avoid nephrotoxic agents as able -Replace electrolytes as indicated  #Acute Metabolic Encephalopathy due to drug overdose #Seizure-like activity (witnessed by EMS) PMHx: Stroke (right thalamic infarct 06/01/2021 and right basal ganglia hemorrhage 06/08/2021), PTSD, Depression MRI Brain 4/26 concerning for evolving subacute small vessel infarct of the right paramedian midbrain, no associated hemorrhage or mass effect -IVC -Seizure & Suicide precautions -Avoid sedating medications as able -Prn Benzo's for seizure -EEG is pending -Low threshold for Neuro consult (has not established with Neurology since previous strokes) ~ will evaluate for any new deficits above baseline deficits now  that is extubated -Now that he is extubated, will consult Psych  #Diabetes Mellitus Type II  Hgb A1c is 5.6 on 06/09/22  -CBG's q4h; Target range of 140 to 180 -SSI -Follow ICU Hypo/Hyperglycemia protocol     Subjective: Patient seen and examined this morning.  Has been intermittently agitated was under sedation at the time of my evaluation.  Nursing ports that patient has been restless receiving morphine and Ativan as needed for agitation..  Vital labs and imaging reviewed.   physical Exam: Vitals:   06/11/22 1100 06/11/22 1200 06/11/22 1215 06/11/22 1300  BP:  (!) 141/100    Pulse: 98 98 91 (!) 108  Resp: 16 19 15 18   Temp:      TempSrc:      SpO2: 97% 98% 99% 98%  Weight:      Height:       Physical Exam Constitutional:      Comments: Somnolent with intermittent restlessness.  HENT:     Head: Normocephalic and atraumatic.     Nose: Nose normal.     Mouth/Throat:     Mouth: Mucous membranes are dry.  Eyes:     Extraocular Movements: Extraocular movements intact.     Pupils: Pupils are equal, round, and reactive to light.  Cardiovascular:     Rate and Rhythm: Tachycardia present.     Pulses: Normal pulses.     Heart sounds: No murmur heard. Pulmonary:     Comments: On 2 L nasal cannula Abdominal:     Palpations: Abdomen is soft.  Musculoskeletal:        General: Normal range of motion.     Cervical back: Normal range of motion.  Skin:    General: Skin is warm.  Neurological:     Comments: Patient in mild sedation.  Follows command but mumbling with dysarthria.  On comprehensible.  Has residual left-sided weakness     Data Reviewed:  There are no new results to review at this time.  Family Communication: None by bedside, nurse updated the girlfriend prior to my arrival.  Disposition: Status is: Inpatient Remains inpatient appropriate because: Drug overdose  Planned Discharge Destination: Rehab    Time spent: 32 minutes  Author: Kirstie Peri,  MD 06/11/2022 2:12 PM  For on call review [email or web address].

## 2022-06-11 NOTE — Consult Note (Signed)
Client continues to be somnolent, psych will continue to follow.  Nanine Means, PMHNP

## 2022-06-11 NOTE — Plan of Care (Signed)
Continuing with plan of care. 

## 2022-06-11 NOTE — Progress Notes (Signed)
Neurology progress note  S: Mental status is unchanged. He has received total of 6mg  morphine and 6mg  ativan in prns in the past 12 hours. Wife provided additional history today that patient and wife were aware of the subacute R paramedian midbrain as this was found after he presented to [facility] the week before last for acute worsening of his chronic dysarthria. Admission to neurology was recommended at that time but patient declined.  O:  Vitals:   06/11/22 1800 06/11/22 1900  BP: (!) 148/101   Pulse: (!) 103 (!) 103  Resp: 17 17  Temp:    SpO2: 99% 100%   Patient received ativan and morphine prior to exam   Physical Exam Gen: arousable to sternal rub, does not respond to orientation questions or follow simple commands HEENT: Atraumatic, normocephalic;mucous membranes moist; oropharynx clear, tongue without atrophy or fasciculations. Neck: Supple, trachea midline. Resp: CTAB, no w/r/r CV: RRR, no m/g/r; nml S1 and S2. 2+ symmetric peripheral pulses. Abd: soft/NT/ND; nabs x 4 quad Extrem: Nml bulk; no cyanosis, clubbing, or edema.   Neuro: *MS: arousable to sternal rub, does not respond to orientation questions or follow simple commands *Speech: severe dysarthria, no intelligible speech *CN: PERRL, (+) corneals, EOMI, L UMN facial droop, hearing intact to voice *Motor & sensory: withdraws to noxious stimuli R>>L *Coordination, gait:  UTA   NIHSS   1a Level of Conscious.: 1 1b LOC Questions: 2 1c LOC Commands: 2 2 Best Gaze: 0 3 Visual: 0 4 Facial Palsy: 1 5a Motor Arm - left: 1 5b Motor Arm - Right: 1 6a Motor Leg - Left: 1 6b Motor Leg - Right: 1 7 Limb Ataxia: 0 8 Sensory: 1 9 Best Language: 3 10 Dysarthria: 2 11 Extinct. and Inatten.: 0   TOTAL: 16   Premorbid mRS = 3  Data  MRI brain wo  1. 7 mm focus of mild diffusion abnormality involving the right paramedian midbrain, likely reflecting an evolving subacute small vessel infarct. No associated hemorrhage  or mass effect. 2. Chronic encephalomalacia and gliosis involving the right basal ganglia/external capsule, likely reflecting changes of prior hemorrhagic infarct. Associated wallerian degeneration. 3. Additional small remote lacunar infarct at the right thalamus.  Stroke Labs  LDL pending   Lab Results  Component Value Date/Time   HGBA1C 5.6 06/09/2022 05:20 AM   HGBA1C 10.9 07/09/2020 12:00 AM     Imaging reviewed; I agree with above interpretation  A/P: This is a 57 year old gentleman with a past medical history significant for prior CVA with residual left-sided hemiparesis (right thalamic infarct on June 01, 2021 and 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 He has now been extubated. Patient had persistent encephalopathy and MRI brain was performed yesterday which showed a 7 mm focus of mild diffusion abnormality involving the right paramedian midbrain likely reflecting evolving small subacute infarct. By additional history provided today by wife patient presented with dysphagia to [facility] 2 weeks ago and new infarct was noted on MRI at that time but he refused admission to neurology for stroke workup. His encephalopathy is unrelated to this infarct as the only symptom associated with it was dysphagia. Etiology of stroke favored to be small vessel. Etiology of encephalopathy felt to be excess sedation with prns and recommend weaning these. Will order EEG for tomorrow since patient was noted to have seizure-like activity on arrival.  -  Minimize/wean prn morphine and ativan - rEEG - CTA H&N - TTE - Switch aspirin to plavix, sx began 3 weeks ago and patient no longer needs to be on DAPT - Patient is outside the window for permissive HTN; goal normotension, avoid hypotension - Check LDL and add statin per guidelines - PT/OT/SLP - Psychiatry consult  pending. Family is interested in transferring patient to [facility] after acute medical issues resolved for additional mental health treatment on an inpatient basis - Ambulatory referral to neurology at discharge  Neurology will f/u on EEG tomorrow.  Bing Neighbors, MD [facility] [phone]  If 7pm- 7am, please page neurology on call as listed in AMION.

## 2022-06-11 NOTE — Progress Notes (Signed)
Patient continues with combativeness and restlessness that is only minimally decreased with ativan; patient is unable to lay still and safely have CT scan performed.

## 2022-06-11 NOTE — Progress Notes (Signed)
SLP Cancellation Note  Patient Details Name: Patrick Perez MRN: [id number] DOB: 02-17-1965   Cancelled treatment:       Reason Eval/Treat Not Completed: Fatigue/lethargy limiting ability to participate;Medical issues which prohibited therapy;Patient not medically ready;Patient's level of consciousness   Per chart review, pt continues to be somnolent. PO trials deferred at this time given pt's LOA is prohibitive of safe oral intake.  Clyde Canterbury, M.S., CCC-SLP Speech-Language Pathologist [facility] [phone] Arnette Felts)  Woodroe Chen 06/11/2022, 11:51 AM

## 2022-06-11 NOTE — Consult Note (Signed)
NEUROLOGY CONSULTATION NOTE   Date of service: June 11, 2022 Patient Name: Patrick Perez MRN:  [id number] DOB:  September 27, 1965 Reason for consult: acute ischemic stroke Requesting physician: Dr. Kirstie Peri _ _ _   _ __   _ __ _ _  __ __   _ __   __ _  History of Present Illness   This is a 57 year old gentleman with a past medical history significant for prior CVA with residual left-sided hemiparesis (right thalamic infarct on June 01, 2021 and right basal ganglia hemorrhage on June 08, 2021), dysphagia, type 2 diabetes who presented to the ED on June 08, 2022 due to intentional drug overdose.  He had to be intubated for airway protection.  He took an overdose of amitriptyline and Flexeril.  Per ICU H&P patient significant other reported that patient called her and his mother to tell them that he had taken too many medications intentionally.  He had struggled with depression since his stroke a year prior and had refused to get established with neurology to work with PT/OT.  EMS reported that patient had taken amitriptyline and Flexeril 1 to 2 hours prior to presentation and they noticed seizure-like activity.  He was noted to have a prolonged QRS and received 2 A of sodium bicarb and 2 mg of Versed.  Upon arrival to the ED he was obtunded and was intubated.  Patient had persistent encephalopathy and MRI brain was performed yesterday which showed a 7 mm focus of mild diffusion abnormality involving the right paramedian midbrain likely reflecting evolving subacute small vessel infarcts.  Imaging also showed chronic encephalomalacia and gliosis involving the right basal ganglia/external capsule secondary to prior hemorrhagic infarct.  Small remote lacunar infarct was also noted in the right thalamus.  CNS imaging was personally reviewed.  Patient is now extubated and remains severely encephalopathic.  ROS   UTA 2/2 mental status  Past History   I have reviewed the following:  Past Medical  History:  Diagnosis Date   Anxiety    Arthritis    rheumatoid   Depression    Diabetes mellitus without complication (HCC)    Stroke (HCC)    Past Surgical History:  Procedure Laterality Date   TIBIA FRACTURE SURGERY Left    TONSILLECTOMY     Family History  Problem Relation Age of Onset   Diabetes Mother    Cancer Father        prostate   Cancer Paternal Grandfather        colon   Social History   Socioeconomic History   Marital status: Divorced    Spouse name: Not on file   Number of children: 3   Years of education: Not on file   Highest education level: GED or equivalent  Occupational History   Occupation: Disabled  Tobacco Use   Smoking status: Former    Packs/day: 0.50    Years: 27.00    Additional pack years: 0.00    Total pack years: 13.50    Types: Cigarettes    Quit date: 04/27/2020    Years since quitting: 2.1   Smokeless tobacco: Never   Tobacco comments:    using nicorett gum  Vaping Use   Vaping Use: Never used  Substance and Sexual Activity   Alcohol use: Yes    Alcohol/week: 2.0 standard drinks of alcohol    Types: 2 Cans of beer per week   Drug use: Yes    Frequency: 7.0 times per week  Types: Marijuana    Comment: smokes once a day    Sexual activity: Yes  Other Topics Concern   Not on file  Social History Narrative   Not on file   Social Determinants of Health   Financial Resource Strain: Low Risk  (08/29/2017)   Overall Financial Resource Strain (CARDIA)    Difficulty of Paying Living Expenses: Not hard at all  Food Insecurity: No Food Insecurity (08/29/2017)   Hunger Vital Sign    Worried About Running Out of Food in the Last Year: Never true    Ran Out of Food in the Last Year: Never true  Transportation Needs: No Transportation Needs (08/29/2017)   PRAPARE - Administrator, Civil Service (Medical): No    Lack of Transportation (Non-Medical): No  Physical Activity: Inactive (08/29/2017)   Exercise Vital Sign     Days of Exercise per Week: 0 days    Minutes of Exercise per Session: 0 min  Stress: No Stress Concern Present (08/29/2017)   [employer] of Occupational Health - Occupational Stress Questionnaire    Feeling of Stress : Only a little  Social Connections: Unknown (08/29/2017)   Social Connection and Isolation Panel [NHANES]    Frequency of Communication with Friends and Family: Patient declined    Frequency of Social Gatherings with Friends and Family: Patient declined    Attends Religious Services: Patient declined    Database administrator or Organizations: Patient declined    Attends Engineer, structural: Patient declined    Marital Status: Divorced   Allergies  Allergen Reactions   Oxycodone Hives   Oxycodone-Acetaminophen Anxiety   Quetiapine Rash    Medications   Medications Prior to Admission  Medication Sig Dispense Refill Last Dose   amitriptyline (ELAVIL) 25 MG tablet Take 25 mg by mouth at bedtime.   06/08/2022   atorvastatin (LIPITOR) 80 MG tablet TAKE 1 TABLET BY MOUTH EVERY DAY 30 tablet 1 unknown   carvedilol (COREG) 6.25 MG tablet Take 6.25 mg by mouth 2 (two) times daily with a meal.   unknown   clopidogrel (PLAVIX) 75 MG tablet TAKE 1 TABLET BY MOUTH EVERY DAY 90 tablet 0 unknown   cyclobenzaprine (FLEXERIL) 10 MG tablet Take 10 mg by mouth 3 (three) times daily as needed for muscle spasms.   unknown   folic acid (FOLVITE) 1 MG tablet Take by mouth.   unknown   gabapentin (NEURONTIN) 300 MG capsule Take 1 capsule (300 mg total) by mouth at bedtime. Dr Alfredo Batty (Patient taking differently: Take 300 mg by mouth 3 (three) times daily. Dr Alfredo Batty) 30 capsule 1 unknown   metFORMIN (GLUCOPHAGE) 500 MG tablet TAKE 1 TABLET BY MOUTH 2 TIMES DAILY WITH A MEAL. 60 tablet 0 unknown   Methotrexate Sodium (METHOTREXATE, PF,) 50 MG/2ML injection Inject 25 mg into the muscle once a week.   unknown   SIMPONI 50 MG/0.5ML SOAJ Inject 50 mg into the skin every 30 (thirty)  days.   unknown   traMADol (ULTRAM) 50 MG tablet Take by mouth every 6 (six) hours as needed.   unknown   aspirin 81 MG chewable tablet Chew 1 tablet (81 mg total) by mouth daily. (Patient not taking: Reported on 06/08/2022) 30 tablet 3 Not Taking   Cholecalciferol (VITAMIN D3) 10 MCG (400 UNIT) tablet Take by mouth.      clotrimazole-betamethasone (LOTRISONE) cream Apply 1 application topically daily. (Patient not taking: Reported on 07/07/2021) 30 g 0  DULoxetine (CYMBALTA) 30 MG capsule TAKE 1 CAPSULE BY MOUTH EVERY DAY (Patient not taking: Reported on 06/08/2022) 90 capsule 0 Not Taking   ferrous sulfate 325 (65 FE) MG tablet Take by mouth.      melatonin 3 MG TABS tablet Take by mouth.      Naproxen Sodium 220 MG CAPS Aleve 220 mg capsule  prn (Patient not taking: Reported on 07/07/2021)      nortriptyline (PAMELOR) 10 MG capsule Take 10 mg by mouth at bedtime. (Patient not taking: Reported on 06/08/2022)   Not Taking   OneTouch Delica Lancets 33G MISC USE TO TEST ONCE DAILY 100 each 0    ONETOUCH ULTRA test strip USE TO TEST ONCE DAILY 100 strip 5       Current Facility-Administered Medications:    aspirin chewable tablet 81 mg, 81 mg, Per Tube, Daily, Kasa, Kurian, MD   Chlorhexidine Gluconate Cloth 2 % PADS 6 each, 6 each, Topical, Q0600, Erin Fulling, MD, 6 each at 06/09/22 2000   dextrose 5 %-0.9 % sodium chloride infusion, , Intravenous, Continuous, Kirstie Peri, MD, Last Rate: 100 mL/hr at 06/11/22 1000, Infusion Verify at 06/11/22 1000   docusate (COLACE) 50 MG/5ML liquid 100 mg, 100 mg, Per Tube, BID, Harlon Ditty D, NP, 100 mg at 06/09/22 0927   docusate (COLACE) 50 MG/5ML liquid 100 mg, 100 mg, Per Tube, BID PRN, Belia Heman, Kurian, MD   enoxaparin (LOVENOX) injection 40 mg, 40 mg, Subcutaneous, Q24H, Harlon Ditty D, NP, 40 mg at 06/10/22 2124   insulin aspart (novoLOG) injection 0-15 Units, 0-15 Units, Subcutaneous, Q4H, Harlon Ditty D, NP, 2 Units at 06/11/22 0313    LORazepam (ATIVAN) injection 2 mg, 2 mg, Intravenous, Q4H PRN, Harlon Ditty D, NP, 2 mg at 06/11/22 8295   morphine (PF) 2 MG/ML injection 2 mg, 2 mg, Intravenous, Q4H PRN, Kirstie Peri, MD, 2 mg at 06/11/22 6213   Oral care mouth rinse, 15 mL, Mouth Rinse, PRN, Nam, Ocie Bob, MD   pantoprazole (PROTONIX) injection 40 mg, 40 mg, Intravenous, QHS, Harlon Ditty D, NP, 40 mg at 06/10/22 2124   polyethylene glycol (MIRALAX / GLYCOLAX) packet 17 g, 17 g, Per Tube, Daily PRN, Harlon Ditty D, NP   polyethylene glycol (MIRALAX / GLYCOLAX) packet 17 g, 17 g, Per Tube, Daily, Harlon Ditty D, NP, 17 g at 06/09/22 0200   potassium chloride 10 mEq in 100 mL IVPB, 10 mEq, Intravenous, Q1 Hr x 3, Kirstie Peri, MD, Last Rate: 50 mL/hr at 06/11/22 1000, Infusion Verify at 06/11/22 1000  Vitals   Vitals:   06/11/22 0700 06/11/22 0800 06/11/22 0900 06/11/22 1000  BP: (!) 118/97 (!) 139/108    Pulse: (!) 104 (!) 107 (!) 101 99  Resp: 15 18 14 16   Temp:      TempSrc:      SpO2: 98% 98% 96% 99%  Weight:      Height:         Body mass index is 27.66 kg/m.  Physical Exam   Patient received ativan and morphine prior to exam  Physical Exam Gen: arousable to sternal rub, does not respond to orientation questions or follow simple commands HEENT: Atraumatic, normocephalic;mucous membranes moist; oropharynx clear, tongue without atrophy or fasciculations. Neck: Supple, trachea midline. Resp: CTAB, no w/r/r CV: RRR, no m/g/r; nml S1 and S2. 2+ symmetric peripheral pulses. Abd: soft/NT/ND; nabs x 4 quad Extrem: Nml bulk; no cyanosis, clubbing, or edema.  Neuro: *MS: arousable to sternal rub,  does not respond to orientation questions or follow simple commands *Speech: severe dysarthria, no intelligible speech *CN: PERRL, (+) corneals, EOMI, L UMN facial droop, hearing intact to voice *Motor & sensory: withdraws to noxious stimuli R>>L *Coordination, gait:  UTA  NIHSS  1a Level of  Conscious.: 1 1b LOC Questions: 2 1c LOC Commands: 2 2 Best Gaze: 0 3 Visual: 0 4 Facial Palsy: 1 5a Motor Arm - left: 1 5b Motor Arm - Right: 1 6a Motor Leg - Left: 1 6b Motor Leg - Right: 1 7 Limb Ataxia: 0 8 Sensory: 1 9 Best Language: 3 10 Dysarthria: 2 11 Extinct. and Inatten.: 0  TOTAL: 16  Premorbid mRS = 3   Labs   CBC:  Recent Labs  Lab 06/08/22 1535 06/09/22 0520 06/10/22 0404 06/11/22 0345  WBC 5.5   < > 8.4 7.5  NEUTROABS 2.8  --   --   --   HGB 14.4   < > 14.0 13.6  HCT 42.0   < > 40.6 39.1  MCV 89.6   < > 88.1 86.9  PLT 218   < > 189 198   < > = values in this interval not displayed.    Basic Metabolic Panel:  Lab Results  Component Value Date   NA 137 06/11/2022   K 3.4 (L) 06/11/2022   CO2 24 06/11/2022   GLUCOSE 118 (H) 06/11/2022   BUN <5 (L) 06/11/2022   CREATININE 0.56 (L) 06/11/2022   CALCIUM 8.9 06/11/2022   GFRNONAA >60 06/11/2022   GFRAA >60 02/25/2019   Lipid Panel:  Lab Results  Component Value Date   LDLCALC 86 06/01/2021   HgbA1c:  Lab Results  Component Value Date   HGBA1C 5.6 06/09/2022   Urine Drug Screen:     Component Value Date/Time   LABOPIA NONE DETECTED 06/08/2022 1546   COCAINSCRNUR NONE DETECTED 06/08/2022 1546   LABBENZ POSITIVE (A) 06/08/2022 1546   AMPHETMU NONE DETECTED 06/08/2022 1546   THCU POSITIVE (A) 06/08/2022 1546   LABBARB NONE DETECTED 06/08/2022 1546    Alcohol Level     Component Value Date/Time   ETH <10 06/08/2022 1535    CT Head without contrast: 1. 7 mm focus of mild diffusion abnormality involving the right paramedian midbrain, likely reflecting an evolving subacute small vessel infarct. No associated hemorrhage or mass effect. 2. Chronic encephalomalacia and gliosis involving the right basal ganglia/external capsule, likely reflecting changes of prior hemorrhagic infarct. Associated wallerian degeneration. 3. Additional small remote lacunar infarct at the right  thalamus.  Imaging personally reviewed; I agree with above interpretation  Impression   This is a 57 year old gentleman with a past medical history significant for prior CVA with residual left-sided hemiparesis (right thalamic infarct on June 01, 2021 and 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 He has now been extubated. Patient had persistent encephalopathy and MRI brain was performed yesterday which showed a 7 mm focus of mild diffusion abnormality involving the right paramedian midbrain likely reflecting evolving small subacute infarct. This is favored to be an incidental finding not causing his encephalopathy. Etiology of infarct favored to be small vessel ischemia.  Recommendations   - Patient is outside window for permissive HTN, goal normotension, avoid hypotension - CTA H&N - TTE w/ bubble - Check A1c and LDL + add statin per guidelines - ASA 81mg   daily + plavix 75mg  daily x21 days f/b plavix 75mg  daily monotherapy after that - q4 hr neuro checks - STAT head CT for any change in neuro exam - Tele - PT/OT/SLP - Stroke education - Amb referral to neurology upon discharge - Psychiatry has been consulted 2/2 intentional drug overdose  Neurology will continue to follow  ______________________________________________________________________   Thank you for the opportunity to take part in the care of this patient. If you have any further questions, please contact the neurology consultation attending.  Signed,  Bing Neighbors, MD [facility] [phone]  If 7pm- 7am, please page neurology on call as listed in AMION.  **Any copied and pasted documentation in this note was written by me in another application not billed for and pasted by me into this document.

## 2022-06-12 ENCOUNTER — Inpatient Hospital Stay: Payer: Medicare Other

## 2022-06-12 ENCOUNTER — Ambulatory Visit: Payer: Medicare Other

## 2022-06-12 ENCOUNTER — Inpatient Hospital Stay (HOSPITAL_COMMUNITY)
Admit: 2022-06-12 | Discharge: 2022-06-12 | Disposition: A | Discharge status: Home / Self Care | Payer: Medicare Other | Attending: Neurology | Admitting: Neurology

## 2022-06-12 DIAGNOSIS — G9341 Metabolic encephalopathy: Secondary | ICD-10-CM

## 2022-06-12 DIAGNOSIS — R569 Unspecified convulsions: Secondary | ICD-10-CM

## 2022-06-12 DIAGNOSIS — T50902S Poisoning by unspecified drugs, medicaments and biological substances, intentional self-harm, sequela: Secondary | ICD-10-CM | POA: Diagnosis not present

## 2022-06-12 DIAGNOSIS — I6389 Other cerebral infarction: Secondary | ICD-10-CM

## 2022-06-12 DIAGNOSIS — R4182 Altered mental status, unspecified: Secondary | ICD-10-CM | POA: Diagnosis not present

## 2022-06-12 DIAGNOSIS — I639 Cerebral infarction, unspecified: Secondary | ICD-10-CM | POA: Diagnosis not present

## 2022-06-12 DIAGNOSIS — E871 Hypo-osmolality and hyponatremia: Secondary | ICD-10-CM

## 2022-06-12 LAB — GLUCOSE, CAPILLARY
Glucose-Capillary: 119 mg/dL — ABNORMAL HIGH (ref 70–99)
Glucose-Capillary: 126 mg/dL — ABNORMAL HIGH (ref 70–99)
Glucose-Capillary: 129 mg/dL — ABNORMAL HIGH (ref 70–99)
Glucose-Capillary: 131 mg/dL — ABNORMAL HIGH (ref 70–99)
Glucose-Capillary: 133 mg/dL — ABNORMAL HIGH (ref 70–99)
Glucose-Capillary: 140 mg/dL — ABNORMAL HIGH (ref 70–99)
Glucose-Capillary: 140 mg/dL — ABNORMAL HIGH (ref 70–99)

## 2022-06-12 LAB — COMPREHENSIVE METABOLIC PANEL
ALT: 15 U/L (ref 0–44)
AST: 15 U/L (ref 15–41)
Albumin: 3.3 g/dL — ABNORMAL LOW (ref 3.5–5.0)
Alkaline Phosphatase: 81 U/L (ref 38–126)
Anion gap: 6 (ref 5–15)
BUN: <5 mg/dL — ABNORMAL LOW (ref 6–20)
CO2: 24 mmol/L (ref 22–32)
Calcium: 8.7 mg/dL — ABNORMAL LOW (ref 8.9–10.3)
Chloride: 104 mmol/L (ref 98–111)
Creatinine, Ser: 0.71 mg/dL (ref 0.61–1.24)
GFR, Estimated: >60 mL/min (ref >60)
Glucose, Bld: 120 mg/dL — ABNORMAL HIGH (ref 70–99)
Potassium: 3.7 mmol/L (ref 3.5–5.1)
Sodium: 134 mmol/L — ABNORMAL LOW (ref 135–145)
Total Bilirubin: 0.8 mg/dL (ref 0.3–1.2)
Total Protein: 7 g/dL (ref 6.5–8.1)

## 2022-06-12 LAB — ECHOCARDIOGRAM COMPLETE BUBBLE STUDY
AR max vel: 2.14 cm2
AV Area VTI: 1.87 cm2
AV Area mean vel: 1.86 cm2
AV Mean grad: 4 mmHg
AV Peak grad: 8.4 mmHg
Ao pk vel: 1.45 m/s
Area-P 1/2: 5.88 cm2
MV VTI: 2.38 cm2
S' Lateral: 2.4 cm

## 2022-06-12 LAB — CBC
HCT: 39.4 % (ref 39.0–52.0)
Hemoglobin: 14 g/dL (ref 13.0–17.0)
MCH: 30.8 pg (ref 26.0–34.0)
MCHC: 35.5 g/dL (ref 30.0–36.0)
MCV: 86.6 fL (ref 80.0–100.0)
Platelets: 204 10*3/uL (ref 150–400)
RBC: 4.55 MIL/uL (ref 4.22–5.81)
RDW: 12.2 % (ref 11.5–15.5)
WBC: 7.4 10*3/uL (ref 4.0–10.5)
nRBC: 0 % (ref 0.0–0.2)

## 2022-06-12 LAB — PHOSPHORUS: Phosphorus: 4.5 mg/dL (ref 2.5–4.6)

## 2022-06-12 LAB — LDL CHOLESTEROL, DIRECT: Direct LDL: 45 mg/dL (ref 0–99)

## 2022-06-12 LAB — MAGNESIUM: Magnesium: 1.6 mg/dL — ABNORMAL LOW (ref 1.7–2.4)

## 2022-06-12 MED ORDER — ACETAMINOPHEN 650 MG RE SUPP: 650.0000 mg | Freq: Four times a day (QID) | RECTAL | Status: DC | PRN

## 2022-06-12 MED ORDER — MAGNESIUM SULFATE 2 GM/50ML IV SOLN
2.0000 g | Freq: Once | INTRAVENOUS | Status: AC
  Administered 2022-06-12: 2 g via INTRAVENOUS
  Filled 2022-06-12: qty 50

## 2022-06-12 MED ORDER — IOHEXOL 350 MG/ML SOLN
75.0000 mL | Freq: Once | INTRAVENOUS | Status: AC | PRN
  Administered 2022-06-12: 75 mL via INTRAVENOUS

## 2022-06-12 MED ORDER — ATORVASTATIN CALCIUM 20 MG PO TABS
40.0000 mg | ORAL_TABLET | Freq: Every day | ORAL | Status: DC
  Administered 2022-06-13 – 2022-06-19 (×7): 40 mg via ORAL
  Filled 2022-06-12 (×7): qty 2

## 2022-06-12 MED ORDER — CLOPIDOGREL BISULFATE 75 MG PO TABS
75.0000 mg | ORAL_TABLET | Freq: Every day | ORAL | Status: DC
  Administered 2022-06-12 – 2022-06-19 (×8): 75 mg via ORAL
  Filled 2022-06-12 (×8): qty 1

## 2022-06-12 NOTE — Progress Notes (Signed)
   06/12/22 1600  Spiritual Encounters  Type of Visit Initial  Care provided to: Significant other  Conversation partners present during encounter Nurse  Referral source Family  OnCall Visit Yes  Spiritual Framework  Patient Stress Factors Not reviewed  Family Stress Factors Financial concerns  Interventions  Spiritual Care Interventions Made Established relationship of care and support;Compassionate presence;Reflective listening  Spiritual Care Plan  Spiritual Care Issues Still Outstanding Referring to oncoming chaplain for further support   Patient girlfriend requesting paperwork to have Power of attorney to make decision on his behalf. Told girlfriend we don't do financial issues when it come to paperwork dealing with outside medical issues. Advise that she maybe able to get paperwork online.

## 2022-06-12 NOTE — Assessment & Plan Note (Signed)
Patient's girlfriend states that he does injections on Fridays.  Will restart folic acid.  Restart gabapentin and tramadol.

## 2022-06-12 NOTE — Progress Notes (Signed)
Eeg done 

## 2022-06-12 NOTE — Progress Notes (Signed)
Progress Note   Patient: Patrick Perez WUJ:[id number] DOB: April 17, 1965 DOA: 06/08/2022     4 DOS: the patient was seen and examined on 06/12/2022   Brief hospital course: 57 year old male with a past medical history significant for prior CVA (right thalamic infarct 06/01/2021 and right basal ganglia hemorrhage 06/08/2021) with left-sided hemiparesis, dysphagia, rheumatoid arthritis, type 2 diabetes mellitus who presents to [facility] ED on 06/08/2022 due to intentional drug overdose.  Patient is currently intubated and sedated, therefore history is obtained from patient's significant other at bedside along with chart review.   Patient's significant other at bedside reports that the patient called her and his mother today telling them that he had taken too many medications intentionally.  She reports that he has been depressed ever since his stroke approximately 1 year ago, and has refused to get established with neurology or to work with PT/OT.  Per EMS the patient was found to have taken amitriptyline and Flexeril 1-2 hours prior to presentation, and they also noted seizure-like activity.  He was given 2 mg of Versed and 2 A of sodium bicarb due to a prolonged QRS.  Upon arrival to the ED he was obtunded with spontaneous respirations, and decision was made by ED provider to intubate for airway protection.   Of note he was recently seen at [facility] campus on 05/19/2022 for progressive dysphagia and slurred speech over several days.  MRI of the brain was concerning for a small infarction in the right midbrain.  He refused admission by neurology.  He was to get established with neurology outpatient, and decision was made to take him off Plavix.   4/25: Presented to ED following intentional drug overdose.  Required intubation in the ED for airway protection.  PCCM asked to admit.   4/26: MRI Brain showed  1. 7 mm focus of mild diffusion abnormality involving the right paramedian midbrain, likely reflecting an  evolving subacute small vessel infarct. No associated hemorrhage or mass effect. 2. Chronic encephalomalacia and gliosis involving the right basal ganglia/external capsule, likely reflecting changes of prior hemorrhagic infarct. Associated wallerian degeneration.3. Additional small remote lacunar infarct at the right thalamus.   4/26: On minimal vent settings. Successfully EXTUBATED.  IVC, Safety sitter at bedside, Psych consulted  4/27 : Patient continues to stay extubated saturations normal vitally stable.  Neurology and psychiatry consulted.  Recommendations pending.   4/28: Patient continues to stay intermittently restless.  Has been receiving around-the-clock Ativan and morphine.  Neurology evaluated recommendations appreciated.  Psych still to eval the patient.  4/29.  Echocardiogram showed a normal ejection fraction.  EEG did not have evidence of a left echogenicity arising from the right frontal central region secondary to encephalomalacia.  Neurology does not want to start any antiseizure medications.  Speech therapy put on a dysphagia 1 diet.  Will get physical therapy and Occupational Therapy consultations.  Assessment and Plan: * Acute stroke due to ischemia [facility]) MRI of the brain shows a 7 mm focus of diffusion abnormality involving the right paramedian midbrain likely reflecting evolving subacute small vessel infarct.  Neurology recommended Plavix.  LDL 86.  Will add Lipitor.  Placed on a diet today.  Will get PT and OT consultations.  Drug overdose, intentional (HCC) Continued psych follow-up.  Overdose on amitriptyline and Flexeril.  Extubated on 4/24.  Acute metabolic encephalopathy Likely secondary to drug overdose.  EEG reviewed and case discussed with neurology.  No need for seizure medication at this point.  Hyponatremia Sodium 134.  Hypomagnesemia Replace IV magnesium today  Rheumatoid arthritis (HCC) Continue to monitor        Subjective: Patient difficult to  understand.  Able to move all extremities.  Admitted with stroke and drug overdose.  Physical Exam: Vitals:   06/12/22 0900 06/12/22 1000 06/12/22 1200 06/12/22 1300  BP:    106/79  Pulse: (!) 114 (!) 114 (!) 116 (!) 117  Resp: 18 18  10   Temp:    98.7 F (37.1 C)  TempSrc:    Oral  SpO2: 96% 94% 97% 95%  Weight:      Height:       Physical Exam HENT:     Head: Normocephalic.     Mouth/Throat:     Pharynx: No oropharyngeal exudate.  Eyes:     General: Lids are normal.     Conjunctiva/sclera: Conjunctivae normal.  Cardiovascular:     Rate and Rhythm: Normal rate and regular rhythm.     Heart sounds: Normal heart sounds, S1 normal and S2 normal.  Pulmonary:     Breath sounds: No decreased breath sounds, wheezing, rhonchi or rales.  Abdominal:     Palpations: Abdomen is soft.     Tenderness: There is no abdominal tenderness.  Musculoskeletal:     Right lower leg: No swelling.     Left lower leg: No swelling.  Skin:    General: Skin is warm.     Findings: No rash.  Neurological:     Mental Status: He is lethargic.     Comments: Patient able to squeeze my hand bilateral arms.  A little weaker on the left side.  Unable to straight leg raise with the left leg.  Able to straight leg raise with the right leg.     Data Reviewed: Hemoglobin 14, sodium 134, creatinine 0.71, magnesium 1.6, hemoglobin A1c 5.6, LDL 86  Family Communication: Spoke with girlfriend on the phone  Disposition: Status is: Inpatient Remains inpatient appropriate because: Will get PT and OT consultations.  Just started on a diet today.  Planned Discharge Destination: Likely will end up needing a psychiatric admission.  Will have to get him stronger first.    Time spent: 28 minutes Case discussed with nursing staff, neurology and speech therapy  Author: Alford Highland, MD 06/12/2022 4:45 PM  For on call review [email or web address].

## 2022-06-12 NOTE — Procedures (Signed)
Patient Name: Patrick Perez  MRN: [id number]  Epilepsy Attending: Charlsie Quest  Referring Physician/Provider: Jefferson Fuel, MD  Date: 06/12/2022 Duration: 27.52 mins  Patient history: 57 year old gentleman with a past medical history significant for prior CVA with residual left-sided hemiparesis (right thalamic infarct on June 01, 2021 and 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 He has now been extubated. Patient had persistent encephalopathy. EEG to evaluate for seizure  Level of alertness: Awake  AEDs during EEG study: None  Technical aspects: This EEG study was done with scalp electrodes positioned according to the 10-20 International system of electrode placement. Electrical activity was reviewed with band pass filter of 1-70Hz , sensitivity of 7 uV/mm, display speed of 62mm/sec with a 60Hz  notched filter applied as appropriate. EEG data were recorded continuously and digitally stored.  Video monitoring was available and reviewed as appropriate.  Description: The posterior dominant rhythm consists of 9 Hz activity of moderate voltage (25-35 uV) seen predominantly in posterior head regions, symmetric and reactive to eye opening and eye closing. EEG showed continuous 3 to 5 Hz theta-delta slowing in right fronto-centro-temporal region. Sharp waves were noted in right fronto-central region.   Hyperventilation and photic stimulation were not performed.     ABNORMALITY - Sharp wave, right fronto-central region. - Continuous slow,  right fronto-centro-temporal region  IMPRESSION: This study showed evidence of epileptogenicity arising from right fronto-central region. Additionally there is cortical dysfunction arising from right fronto-centro-temporal region likely secondary to underlying encephalomalacia. No seizures were seen throughout the  recording.  Jadea Shiffer Annabelle Harman

## 2022-06-12 NOTE — TOC CM/SW Note (Signed)
TOC following for potential disposition needs.  Kaylor Maiers, CSW [phone]  

## 2022-06-12 NOTE — Hospital Course (Signed)
57 year old male with a past medical history significant for prior CVA (right thalamic infarct 06/01/2021 and right basal ganglia hemorrhage 06/08/2021) with left-sided hemiparesis, dysphagia, rheumatoid arthritis, type 2 diabetes mellitus who presents to [facility] ED on 06/08/2022 due to intentional drug overdose.  Patient is currently intubated and sedated, therefore history is obtained from patient's significant other at bedside along with chart review.   Patient's significant other at bedside reports that the patient called her and his mother today telling them that he had taken too many medications intentionally.  She reports that he has been depressed ever since his stroke approximately 1 year ago, and has refused to get established with neurology or to work with PT/OT.  Per EMS the patient was found to have taken amitriptyline and Flexeril 1-2 hours prior to presentation, and they also noted seizure-like activity.  He was given 2 mg of Versed and 2 A of sodium bicarb due to a prolonged QRS.  Upon arrival to the ED he was obtunded with spontaneous respirations, and decision was made by ED provider to intubate for airway protection.   Of note he was recently seen at [facility] campus on 05/19/2022 for progressive dysphagia and slurred speech over several days.  MRI of the brain was concerning for a small infarction in the right midbrain.  He refused admission by neurology.  He was to get established with neurology outpatient, and decision was made to take him off Plavix.   4/25: Presented to ED following intentional drug overdose.  Required intubation in the ED for airway protection.  PCCM asked to admit.   4/26: MRI Brain showed  1. 7 mm focus of mild diffusion abnormality involving the right paramedian midbrain, likely reflecting an evolving subacute small vessel infarct. No associated hemorrhage or mass effect. 2. Chronic encephalomalacia and gliosis involving the right basal ganglia/external capsule,  likely reflecting changes of prior hemorrhagic infarct. Associated wallerian degeneration.3. Additional small remote lacunar infarct at the right thalamus.   4/26: On minimal vent settings. Successfully EXTUBATED.  IVC, Safety sitter at bedside, Psych consulted  4/27 : Patient continues to stay extubated saturations normal vitally stable.  Neurology and psychiatry consulted.  Recommendations pending.   4/28: Patient continues to stay intermittently restless.  Has been receiving around-the-clock Ativan and morphine.  Neurology evaluated recommendations appreciated.  Psych still to eval the patient.  4/29.  Echocardiogram showed a normal ejection fraction.  EEG did not have evidence of a left echogenicity arising from the right frontal central region secondary to encephalomalacia.  Neurology does not want to start any antiseizure medications.  Speech therapy put on a dysphagia 1 diet.  Will get physical therapy and Occupational Therapy consultations.  4/30.  CT angiogram shows occlusion of the right vertebral artery.  Patient worked with physical therapy.  Patient did well with a swallow evaluation today and upgraded on diet.

## 2022-06-12 NOTE — Progress Notes (Signed)
Speech Language Pathology Treatment: Dysphagia  Patient Details Name: Patrick Perez MRN: [id number] DOB: [birth year] Today's Date: 06/12/2022 Time: 1330-1430 SLP Time Calculation (min) (ACUTE ONLY): 60 min  Assessment / Plan / Recommendation Clinical Impression  Pt seen today for ongoing assessment of swallowing and potential upgrade to oral diet. Pt presents w/ increased alert State and verbally engaged w/ this SLP to answer basic questions re: self. He followed basic instructions w/ cue. Pt was eager to have something to drink, "juice".  On RA, afebrile, WBC WNL. Pt was orally intubated for airway protection ~24 hours.  Regarding his speech/intelligibility, dysarthric speech noted but also suspect possible component of apraxia. During an OM exam, in ISOLATION, lingual movements had ROM and strength - esp. posterior tongue strength. There was mild-mod Left anterior lingual deviation intermittently -- pt could correct to midline. In CONNECTED oral-lingual movements for speech, his articulation was decreased and overall intelligibility of speech was reduced -- also volume of speech was low. Slight decreased labial tone on Left side. During OM tasks and oral care, noted reduced coordination and biting on tongue blade and swab. Unsure of the degree of severity in setting of pt's previous strokes w/ Left sided weakness.  Pt seemed unaware of his speech errors/poor articulation and intelligibility during conversation; reduced auditory loop awareness.  Pt educated on aspiration precautions in setting dysphagia. Highlighted that he is now able to eat/drink po's b/c of being fully awake/alert. Also educated pt on the need for a slight-min Head Turn Left during oral intake at this time. This was trialed w/ po's given this session and pt was able to follow through w/ the Head Turn Left instruction w/ 90>% accuracy. Pt was presented w/ single ice chips w/ instruction to "chew vigorously, then swallow" - he  completed 5/6 trials w/ No coughing or other overt s/s of aspiration noted. Then trials of Puree and Nectar liquids (via tsp, straw) were presented. Pt consumed ~4 ozs of each w/ No overt, clinical s/s of aspiration noted: respiratory status remained calm and unlabored, vocal quality clear b/t trials, no coughing, O2 sats remained 99%. Oral phase appeared grossly [facility] for functional bolus management and timely A-P transfer for swallowing; oral clearing achieved w/ consistencies. Noted min increased oral phase coordination time inconsistently; possibly moreso w/ increased textured trials(puree).  Pt was not able to feed self. Will address possible OT assessment for RUE use for self-feeding.   Post discussion w/ NSG and a secure chat w/ MD, recommend a Dysphagia level 1 diet (Puree w/ gravies added to moisten foods); Nectar liquids. Recommend aspiration precautions; monitoring at meals for s/s of aspiration and toleration of po's. Feeding support at meals -- pt to hold cup to drink as able. Monitor oral clearing b/t trials. Pills CRUSHED in Puree for safer swallowing. REFLUX precautions recommended d/t ETOH use.  ST services will continue to follow for dysphagia tx, objective swallow assessment as indicated. Precautions posted at bedside. NSG and Safety Sitter updated.  Pt was given education on diet consistency and aspiration precautions in setting of dysphagia and risk for aspiration/aspiration pneumonia. Will continue to follow for speech-language therapy also.        HPI HPI: Pt is a 57 y.o. male with a PMH of  Alcohol use disorder and recent admit to [facility] hospital 02/2022, PTSD, T2DM, RA, and prior CVA w/ LEFT sided weakness (right thalamic infarct 06/01/2021 and right basal ganglia hemorrhage 06/08/2021) who presented to [facility] ED on 05/19/2022 with reports of worsening dysphagia. (  Of note, he had been seen for complaints if slurred speech at [facility] 04/2022 but did not accept transfer to [facility] per  chart notes.)  MRI revealed small infarct in the right midbrain. Plan to admit pt, however he refused admit then.  Per chart, pt presented to outside hospital in [place], [place] where he was diagnosed with aspiration pneumonia. On 06/08/2022, pt presented to [facility] ED d/t obtunded state from intentional overdose of 750 mg of amitriptyline and Flexeril. Pt intubated less than 24 hours.  CXR: Bilateral patchy parenchymal interstitial lung changes. MRI: 7 mm focus of mild diffusion abnormality involving the right  paramedian midbrain, likely reflecting an evolving subacute small  vessel infarct. No associated hemorrhage or mass effect.  2. Chronic encephalomalacia and gliosis involving the right basal  ganglia/external capsule, likely reflecting changes of prior  hemorrhagic infarct. Associated wallerian degeneration.  3. Additional small remote lacunar infarct at the right thalamus.      SLP Plan  Continue with current plan of care      Recommendations for follow up therapy are one component of a multi-disciplinary discharge planning process, led by the attending physician.  Recommendations may be updated based on patient status, additional functional criteria and insurance authorization.    Recommendations  Diet recommendations: Dysphagia 1 (puree);Nectar-thick liquid Liquids provided via: Teaspoon;Cup;Straw (monitor) Medication Administration: Crushed with puree Supervision: Staff to assist with self feeding;Full supervision/cueing for compensatory strategies Compensations: Minimize environmental distractions;Slow rate;Small sips/bites;Lingual sweep for clearance of pocketing;Follow solids with liquid Postural Changes and/or Swallow Maneuvers: Seated upright 90 degrees;Out of bed for meals;Upright 30-60 min after meal (Reflux precs)                 (unsure of other therapy f/u) Oral care BID;Oral care before and after PO;Staff/trained caregiver to provide oral care (support)   Frequent or  constant Supervision/Assistance Dysphagia, oropharyngeal phase (R13.12)     Continue with current plan of care      Jerilynn Som, MS, CCC-SLP Speech Language Pathologist Rehab Services; [facility] [phone] (ascom) Lewellyn Fultz  06/12/2022, 3:42 PM

## 2022-06-12 NOTE — Assessment & Plan Note (Addendum)
Continued psych follow-up.  Overdose on amitriptyline and Flexeril.  Extubated on 4/24.

## 2022-06-12 NOTE — Consult Note (Signed)
During today's rounding, there is a sitter present at bedside for safety precautions; there is also an ultrasound tech at the bedside. The patient is observed lying in bed with eyes closed. He was responsive to verbal stimuli, opening his eyes briefly when his name was called. The patient presented with slurred unintelligible speech; no restlessness or agitation was observed during the encounter. The assigned nurse, Harrold Donath, reported that the patient is scheduled for a CT scan and an EEG later today. He has not noticed any restlessness or agitation in the patient at this time. The patient will continued to be followed, as he will need psychiatric hospitalization following medical clearance due to his intentional overdose.   Eliyah Bazzi H. Benentt, NP  06/12/2022 1100

## 2022-06-12 NOTE — Assessment & Plan Note (Addendum)
MRI of the brain shows a 7 mm focus of diffusion abnormality involving the right paramedian midbrain likely reflecting evolving subacute small vessel infarct.  Neurology recommended Plavix.  LDL 86.  Will add Lipitor.  Placed on a diet today.  Will get PT and OT consultations.

## 2022-06-12 NOTE — Assessment & Plan Note (Signed)
Sodium now normal range

## 2022-06-12 NOTE — Assessment & Plan Note (Addendum)
Likely secondary to drug overdose.  EEG reviewed and case discussed with neurology.  No need for seizure medication at this point.

## 2022-06-12 NOTE — Plan of Care (Signed)
EEG: Sharp wave, right fronto-central region; continuous slow,  right fronto-centro-temporal region. This study showed evidence of epileptogenicity arising from right fronto-central region. Additionally there is cortical dysfunction arising from right fronto-centro-temporal region likely secondary to underlying encephalomalacia. No seizures were seen throughout the recording.  No strong indication for initiation of an anticonvulsant based on EEG. It is not certain if he had a definite seizure with EMS (they reported "seizure-like activity") rather than tremor or posturing secondary to AMS due to his overdose. Although spikes on EEG would increase the probability of developing seizures, that alone is not an indication for starting an anticonvulsant.   Electronically signed: Dr. Caryl Pina

## 2022-06-12 NOTE — Assessment & Plan Note (Signed)
Replaced 

## 2022-06-13 ENCOUNTER — Inpatient Hospital Stay: Payer: Medicare Other

## 2022-06-13 DIAGNOSIS — I639 Cerebral infarction, unspecified: Secondary | ICD-10-CM | POA: Diagnosis not present

## 2022-06-13 DIAGNOSIS — G9341 Metabolic encephalopathy: Secondary | ICD-10-CM | POA: Diagnosis not present

## 2022-06-13 DIAGNOSIS — I6501 Occlusion and stenosis of right vertebral artery: Secondary | ICD-10-CM

## 2022-06-13 DIAGNOSIS — T50902S Poisoning by unspecified drugs, medicaments and biological substances, intentional self-harm, sequela: Secondary | ICD-10-CM | POA: Diagnosis not present

## 2022-06-13 LAB — RENAL FUNCTION PANEL
Albumin: 3.1 g/dL — ABNORMAL LOW (ref 3.5–5.0)
Anion gap: 8 (ref 5–15)
BUN: 7 mg/dL (ref 6–20)
CO2: 23 mmol/L (ref 22–32)
Calcium: 8.9 mg/dL (ref 8.9–10.3)
Chloride: 105 mmol/L (ref 98–111)
Creatinine, Ser: 0.65 mg/dL (ref 0.61–1.24)
GFR, Estimated: >60 mL/min (ref >60)
Glucose, Bld: 133 mg/dL — ABNORMAL HIGH (ref 70–99)
Phosphorus: 3.9 mg/dL (ref 2.5–4.6)
Potassium: 3.8 mmol/L (ref 3.5–5.1)
Sodium: 136 mmol/L (ref 135–145)

## 2022-06-13 LAB — GLUCOSE, CAPILLARY
Glucose-Capillary: 142 mg/dL — ABNORMAL HIGH (ref 70–99)
Glucose-Capillary: 127 mg/dL — ABNORMAL HIGH (ref 70–99)
Glucose-Capillary: 163 mg/dL — ABNORMAL HIGH (ref 70–99)
Glucose-Capillary: 144 mg/dL — ABNORMAL HIGH (ref 70–99)
Glucose-Capillary: 150 mg/dL — ABNORMAL HIGH (ref 70–99)

## 2022-06-13 LAB — CBC
HCT: 39.9 % (ref 39.0–52.0)
Hemoglobin: 14.1 g/dL (ref 13.0–17.0)
MCH: 30.7 pg (ref 26.0–34.0)
MCHC: 35.3 g/dL (ref 30.0–36.0)
MCV: 86.7 fL (ref 80.0–100.0)
Platelets: 208 10*3/uL (ref 150–400)
RBC: 4.6 MIL/uL (ref 4.22–5.81)
RDW: 12.2 % (ref 11.5–15.5)
WBC: 6.2 10*3/uL (ref 4.0–10.5)
nRBC: 0 % (ref 0.0–0.2)

## 2022-06-13 MED ORDER — TRAMADOL HCL 50 MG PO TABS
50.0000 mg | ORAL_TABLET | Freq: Two times a day (BID) | ORAL | Status: DC | PRN
  Administered 2022-06-14 – 2022-06-18 (×6): 50 mg via ORAL
  Filled 2022-06-13 (×8): qty 1

## 2022-06-13 MED ORDER — FOLIC ACID 1 MG PO TABS
1.0000 mg | ORAL_TABLET | Freq: Every day | ORAL | Status: DC
  Administered 2022-06-13 – 2022-06-19 (×7): 1 mg via ORAL
  Filled 2022-06-13 (×7): qty 1

## 2022-06-13 MED ORDER — GABAPENTIN 300 MG PO CAPS
300.0000 mg | ORAL_CAPSULE | Freq: Three times a day (TID) | ORAL | Status: DC
  Administered 2022-06-13 – 2022-06-19 (×18): 300 mg via ORAL
  Filled 2022-06-13 (×18): qty 1

## 2022-06-13 MED ORDER — ACETAMINOPHEN 325 MG PO TABS
650.0000 mg | ORAL_TABLET | Freq: Four times a day (QID) | ORAL | Status: DC | PRN
  Administered 2022-06-13 – 2022-06-19 (×5): 650 mg via ORAL
  Filled 2022-06-13 (×5): qty 2

## 2022-06-13 NOTE — Evaluation (Signed)
Occupational Therapy Evaluation Patient Details Name: Patrick Perez MRN: [id number] DOB: [birth year] Today's Date: 06/13/2022   History of Present Illness 57 year old male with a past medical history significant for prior CVA (right thalamic infarct 06/01/2021 and right basal ganglia hemorrhage 06/08/2021) with left-sided hemiparesis, dysphagia, rheumatoid arthritis, type 2 diabetes mellitus who presents to [facility] ED on 06/08/2022 due to intentional drug overdose.  Patient is currently intubated and sedated, therefore history is obtained from patient's significant other at bedside along with chart review.   Clinical Impression   Patient presenting with decreased Ind in self care,balance, functional mobility/transfers, endurance, and safety awareness. Patient with expressive aphasia and no family present in room. He is seen for skilled co-evaluation with PT for pt and therapist safety. Pt reports living in a handicap apartment with girlfriend and reports being ind at baseline with self care tasks and ambulation with use of SPC and RW. Pt with significant limitations in ROM and strength for B UEs which pt reports are baseline. Pt is impulsive during session and stands from EOB and begins to have BM on bed. Pt needing mod - max A of 2 for bed mobility, functional transfer to [facility], and assistance for hygiene. Patient will benefit from acute OT to increase overall independence in the areas of ADLs, functional mobility, and safety awareness in order to safely discharge.     Recommendations for follow up therapy are one component of a multi-disciplinary discharge planning process, led by the attending physician.  Recommendations may be updated based on patient status, additional functional criteria and insurance authorization.   Assistance Recommended at Discharge Frequent or constant Supervision/Assistance  Patient can return home with the following Two people to help with walking and/or transfers;Two people to  help with bathing/dressing/bathroom;Assist for transportation;Assistance with cooking/housework;Help with stairs or ramp for entrance;Direct supervision/assist for financial management;Direct supervision/assist for medications management    Functional Status Assessment  Patient has had a recent decline in their functional status and demonstrates the ability to make significant improvements in function in a reasonable and predictable amount of time.  Equipment Recommendations  Other (comment) (defer to next venue of care)       Precautions / Restrictions Precautions Precautions: Fall Restrictions Weight Bearing Restrictions: No      Mobility Bed Mobility Overal bed mobility: Needs Assistance Bed Mobility: Supine to Sit, Sit to Supine     Supine to sit: Mod assist, +2 for physical assistance, +2 for safety/equipment Sit to supine: Max assist, +2 for physical assistance, +2 for safety/equipment        Transfers Overall transfer level: Needs assistance Equipment used: 2 person hand held assist Transfers: Sit to/from Stand, Bed to chair/wheelchair/BSC Sit to Stand: Mod assist     Step pivot transfers: Mod assist, +2 physical assistance, +2 safety/equipment            Balance Overall balance assessment: Needs assistance Sitting-balance support: Feet supported, Single extremity supported Sitting balance-Leahy Scale: Fair     Standing balance support: During functional activity, Bilateral upper extremity supported Standing balance-Leahy Scale: Zero                             ADL either performed or assessed with clinical judgement   ADL Overall ADL's : Needs assistance/impaired                         Toilet Transfer: Moderate assistance;Rolling walker (2 wheels);Stand-pivot;BSC/3in1;Cueing for  safety;+2 for physical assistance;+2 for safety/equipment   Toileting- Clothing Manipulation and Hygiene: Moderate assistance;Sit to/from stand;+2 for  physical assistance Toileting - Clothing Manipulation Details (indicate cue type and reason): One person standing with pt and other assisting with hygiene             Vision Patient Visual Report: No change from baseline              Pertinent Vitals/Pain Pain Assessment Pain Assessment: Faces Faces Pain Scale: No hurt     Hand Dominance Right   Extremity/Trunk Assessment Upper Extremity Assessment Upper Extremity Assessment: LUE deficits/detail;RUE deficits/detail RUE Deficits / Details: decreased shoulder elevation at baseline per pt report LUE Deficits / Details: Prior CVA with residual weakness and increased tone   Lower Extremity Assessment Lower Extremity Assessment: Defer to PT evaluation       Communication Communication Communication: Expressive difficulties   Cognition Arousal/Alertness: Awake/alert Behavior During Therapy: Flat affect, Impulsive Overall Cognitive Status: No family/caregiver present to determine baseline cognitive functioning                                 General Comments: Unsure of pt's cognition at baseline as no family present. He is impulsive during session but follows 1 step commands inconsistently with increased time. Perseverates over story where he fractured L UE in the past.                Home Living Family/patient expects to be discharged to:: Skilled nursing facility Living Arrangements: Spouse/significant other;Children Available Help at Discharge: Family Type of Home: Apartment             Bathroom Shower/Tub: Walk-in shower         Home Equipment: Agricultural consultant (2 wheels);Cane - single point;Wheelchair - manual;Shower seat   Additional Comments: Pt reporting he lives in an apartment with his girlfriend but unsure of accuracy as pt is poor historian and no family present.      Prior Functioning/Environment Prior Level of Function : Independent/Modified Independent;Patient poor  historian/Family not available               ADLs Comments: Pt reports being Ind in self care and use of SPC and RW for mobility.        OT Problem List: Decreased strength;Decreased coordination;Decreased activity tolerance;Decreased safety awareness;Impaired balance (sitting and/or standing);Decreased knowledge of use of DME or AE;Impaired UE functional use      OT Treatment/Interventions: Self-care/ADL training;Therapeutic exercise;Therapeutic activities;DME and/or AE instruction;Balance training;Patient/family education;Energy conservation;Cognitive remediation/compensation;Neuromuscular education    OT Goals(Current goals can be found in the care plan section) Acute Rehab OT Goals Patient Stated Goal: to get better OT Goal Formulation: With patient Time For Goal Achievement: 06/27/22 Potential to Achieve Goals: Fair ADL Goals Pt Will Perform Grooming: with min assist;sitting Pt Will Perform Lower Body Dressing: with mod assist;sit to/from stand Pt Will Transfer to Toilet: with mod assist;ambulating Pt Will Perform Toileting - Clothing Manipulation and hygiene: with mod assist;sit to/from stand  OT Frequency: Min 2X/week    Co-evaluation PT/OT/SLP Co-Evaluation/Treatment: Yes Reason for Co-Treatment: Complexity of the patient's impairments (multi-system involvement);Necessary to address cognition/behavior during functional activity;For patient/therapist safety;To address functional/ADL transfers PT goals addressed during session: Mobility/safety with mobility;Balance OT goals addressed during session: ADL's and self-care      AM-PAC OT "6 Clicks" Daily Activity     Outcome Measure Help from another person eating meals?: A  Little Help from another person taking care of personal grooming?: A Little Help from another person toileting, which includes using toliet, bedpan, or urinal?: Total Help from another person bathing (including washing, rinsing, drying)?: A Lot Help  from another person to put on and taking off regular upper body clothing?: A Lot Help from another person to put on and taking off regular lower body clothing?: Total 6 Click Score: 12   End of Session Nurse Communication: Mobility status  Activity Tolerance: Patient tolerated treatment well;Patient limited by fatigue Patient left: in bed;with call bell/phone within reach;with bed alarm set;with nursing/sitter in room  OT Visit Diagnosis: Unsteadiness on feet (R26.81);Muscle weakness (generalized) (M62.81);Hemiplegia and hemiparesis Hemiplegia - Right/Left: Left Hemiplegia - dominant/non-dominant: Non-Dominant                Time: 1610-9604 OT Time Calculation (min): 27 min Charges:  OT General Charges $OT Visit: 1 Visit OT Evaluation $OT Eval High Complexity: 1 High  [address] MS, OTR/L , CBIS ascom [phone]  06/13/22, 11:56 AM

## 2022-06-13 NOTE — Assessment & Plan Note (Signed)
Continue Plavix and Lipitor

## 2022-06-13 NOTE — Consult Note (Signed)
  Patrick Perez is a 57 year old male with past psychiatric history of alcohol abuse, marijuana use, anxiety, depression and recent medical history of CVA (right thalamic infarct 06/01/2021 and right basal ganglia hemorrhage 06/08/2021) with left-sided hemiparesis, dysphagia, rheumatoid arthritis, type 2 diabetes mellitus who presents to [facility] ED on 06/08/2022 due to intentional drug overdose.   Patient rounded on today by psychiatry where he was observed alert with sitter at bedside. He remains responsive to verbal stimuli with appropriate tracking noted. He is calm and cooperative to his ability. Speech remains slow and slurred. He presents alert and oriented to person, place (hospital), situation ('I took too many pills. I was angry'). He rates his depression 1/10 stating 'I'm not depressed at all'; reports some regret stating 'I messed up'. He reports having 'tears of joy' regarding his girlfriend and children. Denies any SI/HI/AVH. Based on psychiatry notes, no changes noted over past few days. Staff deny any mood changes or agitation. Psychiatry team to continue to follow. Psychiatric hospitalization recommended upon medical clearance.

## 2022-06-13 NOTE — Evaluation (Signed)
Physical Therapy Evaluation Patient Details Name: Patrick Perez MRN: [id number] DOB: 1965-09-03 Today's Date: 06/13/2022  History of Present Illness  57 year old male with a past medical history significant for prior CVA (right thalamic infarct 06/01/2021 and right basal ganglia hemorrhage 06/08/2021) with left-sided hemiparesis, dysphagia, rheumatoid arthritis, type 2 diabetes mellitus who presents to [facility] ED on 06/08/2022 due to intentional drug overdose.  Patient is currently intubated and sedated, therefore history is obtained from patient's significant other at bedside along with chart review.  Clinical Impression  Patient admitted with the above. Patient reports living with girlfriend in handicap apartment and reports independence with ambulation with use of SPC and RW. Patient presents with L sided weakness, impaired sensation/proprioception, impaired balance, decreased activity tolerance, and impaired cognition. Requires mod-maxA+2 for bed mobility and transfers with frequent cues and assist for LLE management as patient tends to lack attention of L side. Difficult to understand 2/2 speech impairments.Patient will benefit from skilled PT services during acute stay to address listed deficits.        Recommendations for follow up therapy are one component of a multi-disciplinary discharge planning process, led by the attending physician.  Recommendations may be updated based on patient status, additional functional criteria and insurance authorization.  Follow Up Recommendations Can patient physically be transported by private vehicle: No     Assistance Recommended at Discharge Frequent or constant Supervision/Assistance  Patient can return home with the following  A lot of help with walking and/or transfers;A lot of help with bathing/dressing/bathroom;Assistance with cooking/housework;Direct supervision/assist for medications management;Direct supervision/assist for financial management;Assist  for transportation;Help with stairs or ramp for entrance    Equipment Recommendations Other (comment) (TBD)  Recommendations for Other Services       Functional Status Assessment Patient has had a recent decline in their functional status and demonstrates the ability to make significant improvements in function in a reasonable and predictable amount of time.     Precautions / Restrictions Precautions Precautions: Fall Restrictions Weight Bearing Restrictions: No      Mobility  Bed Mobility Overal bed mobility: Needs Assistance Bed Mobility: Supine to Sit, Sit to Supine     Supine to sit: Mod assist, +2 for physical assistance, +2 for safety/equipment Sit to supine: Max assist, +2 for physical assistance, +2 for safety/equipment        Transfers Overall transfer level: Needs assistance Equipment used: 2 person hand held assist Transfers: Sit to/from Stand, Bed to chair/wheelchair/BSC Sit to Stand: Mod assist   Step pivot transfers: Mod assist, +2 physical assistance, +2 safety/equipment            Ambulation/Gait                  Stairs            Wheelchair Mobility    Modified Rankin (Stroke Patients Only) Modified Rankin (Stroke Patients Only) Pre-Morbid Rankin Score: No significant disability Modified Rankin: Severe disability     Balance Overall balance assessment: Needs assistance Sitting-balance support: Feet supported, Single extremity supported Sitting balance-Leahy Scale: Fair     Standing balance support: During functional activity, Bilateral upper extremity supported Standing balance-Leahy Scale: Zero                               Pertinent Vitals/Pain Pain Assessment Pain Assessment: Faces Faces Pain Scale: No hurt    Home Living Family/patient expects to be discharged to:: Skilled nursing facility Living  Arrangements: Spouse/significant other;Children Available Help at Discharge: Family Type of Home:  Apartment           Home Equipment: Agricultural consultant (2 wheels);Cane - single point;Wheelchair - manual;Shower seat Additional Comments: Pt reporting he lives in an apartment with his girlfriend but unsure of accuracy as pt is poor historian and no family present.    Prior Function Prior Level of Function : Independent/Modified Independent;Patient poor historian/Family not available               ADLs Comments: Pt reports being Ind in self care and use of SPC and RW for mobility.     Hand Dominance   Dominant Hand: Right    Extremity/Trunk Assessment   Upper Extremity Assessment Upper Extremity Assessment: Defer to OT evaluation RUE Deficits / Details: decreased shoulder elevation at baseline per pt report LUE Deficits / Details: Prior CVA with residual weakness and increased tone    Lower Extremity Assessment Lower Extremity Assessment: LLE deficits/detail LLE Deficits / Details: appears weaker than R LE with AROM, however functionally weak 2/2 decreased sensation LLE Sensation: decreased light touch;decreased proprioception LLE Coordination: decreased gross motor;decreased fine motor       Communication   Communication: Expressive difficulties  Cognition Arousal/Alertness: Awake/alert Behavior During Therapy: Flat affect, Impulsive Overall Cognitive Status: No family/caregiver present to determine baseline cognitive functioning                                 General Comments: Unsure of pt's cognition at baseline as no family present. He is impulsive during session but follows 1 step commands inconsistently with increased time. Perseverates over story where he fractured L UE in the past.        General Comments      Exercises     Assessment/Plan    PT Assessment Patient needs continued PT services  PT Problem List Decreased strength;Decreased activity tolerance;Decreased balance;Decreased coordination;Decreased mobility;Decreased  cognition;Decreased knowledge of use of DME;Decreased safety awareness;Decreased knowledge of precautions;Impaired sensation       PT Treatment Interventions DME instruction;Gait training;Therapeutic activities;Functional mobility training;Therapeutic exercise;Balance training;Patient/family education    PT Goals (Current goals can be found in the Care Plan section)  Acute Rehab PT Goals Patient Stated Goal: to move better PT Goal Formulation: With patient Time For Goal Achievement: 06/27/22 Potential to Achieve Goals: Fair    Frequency Min 3X/week     Co-evaluation PT/OT/SLP Co-Evaluation/Treatment: Yes Reason for Co-Treatment: Complexity of the patient's impairments (multi-system involvement);Necessary to address cognition/behavior during functional activity;For patient/therapist safety;To address functional/ADL transfers PT goals addressed during session: Mobility/safety with mobility;Balance OT goals addressed during session: ADL's and self-care       AM-PAC PT "6 Clicks" Mobility  Outcome Measure Help needed turning from your back to your side while in a flat bed without using bedrails?: Total Help needed moving from lying on your back to sitting on the side of a flat bed without using bedrails?: Total Help needed moving to and from a bed to a chair (including a wheelchair)?: Total Help needed standing up from a chair using your arms (e.g., wheelchair or bedside chair)?: Total Help needed to walk in hospital room?: Total Help needed climbing 3-5 steps with a railing? : Total 6 Click Score: 6    End of Session   Activity Tolerance: Patient tolerated treatment well Patient left: in bed;with call bell/phone within reach;with nursing/sitter in room Nurse Communication: Mobility  status PT Visit Diagnosis: Unsteadiness on feet (R26.81);Muscle weakness (generalized) (M62.81);Other abnormalities of gait and mobility (R26.89);Difficulty in walking, not elsewhere classified  (R26.2);Hemiplegia and hemiparesis Hemiplegia - Right/Left: Left Hemiplegia - dominant/non-dominant: Non-dominant Hemiplegia - caused by: Cerebral infarction    Time: 9604-5409 PT Time Calculation (min) (ACUTE ONLY): 26 min   Charges:   PT Evaluation $PT Eval Moderate Complexity: 1 Mod          Maylon Peppers, PT, DPT Physical Therapist - [place]  [facility]   Basim Bartnik A Edeline Greening 06/13/2022, 1:16 PM

## 2022-06-13 NOTE — Progress Notes (Signed)
Progress Note   Patient: Patrick Perez ION:[id number] DOB: 12-05-1965 DOA: 06/08/2022     5 DOS: the patient was seen and examined on 06/13/2022   Brief hospital course: 57 year old male with a past medical history significant for prior CVA (right thalamic infarct 06/01/2021 and right basal ganglia hemorrhage 06/08/2021) with left-sided hemiparesis, dysphagia, rheumatoid arthritis, type 2 diabetes mellitus who presents to [facility] ED on 06/08/2022 due to intentional drug overdose.  Patient is currently intubated and sedated, therefore history is obtained from patient's significant other at bedside along with chart review.   Patient's significant other at bedside reports that the patient called her and his mother today telling them that he had taken too many medications intentionally.  She reports that he has been depressed ever since his stroke approximately 1 year ago, and has refused to get established with neurology or to work with PT/OT.  Per EMS the patient was found to have taken amitriptyline and Flexeril 1-2 hours prior to presentation, and they also noted seizure-like activity.  He was given 2 mg of Versed and 2 A of sodium bicarb due to a prolonged QRS.  Upon arrival to the ED he was obtunded with spontaneous respirations, and decision was made by ED provider to intubate for airway protection.   Of note he was recently seen at [facility] campus on 05/19/2022 for progressive dysphagia and slurred speech over several days.  MRI of the brain was concerning for a small infarction in the right midbrain.  He refused admission by neurology.  He was to get established with neurology outpatient, and decision was made to take him off Plavix.   4/25: Presented to ED following intentional drug overdose.  Required intubation in the ED for airway protection.  PCCM asked to admit.   4/26: MRI Brain showed  1. 7 mm focus of mild diffusion abnormality involving the right paramedian midbrain, likely reflecting an  evolving subacute small vessel infarct. No associated hemorrhage or mass effect. 2. Chronic encephalomalacia and gliosis involving the right basal ganglia/external capsule, likely reflecting changes of prior hemorrhagic infarct. Associated wallerian degeneration.3. Additional small remote lacunar infarct at the right thalamus.   4/26: On minimal vent settings. Successfully EXTUBATED.  IVC, Safety sitter at bedside, Psych consulted  4/27 : Patient continues to stay extubated saturations normal vitally stable.  Neurology and psychiatry consulted.  Recommendations pending.   4/28: Patient continues to stay intermittently restless.  Has been receiving around-the-clock Ativan and morphine.  Neurology evaluated recommendations appreciated.  Psych still to eval the patient.  4/29.  Echocardiogram showed a normal ejection fraction.  EEG did not have evidence of a left echogenicity arising from the right frontal central region secondary to encephalomalacia.  Neurology does not want to start any antiseizure medications.  Speech therapy put on a dysphagia 1 diet.  Will get physical therapy and Occupational Therapy consultations.  4/30.  CT angiogram shows occlusion of the right vertebral artery.  Patient worked with physical therapy.  Patient did well with a swallow evaluation today and upgraded on diet.  Assessment and Plan: * Acute stroke due to ischemia [facility]) MRI of the brain shows a 7 mm focus of diffusion abnormality involving the right paramedian midbrain likely reflecting evolving subacute small vessel infarct.  Neurology recommended Plavix.  LDL 86.  Will add Lipitor.  Placed on a diet today.  Will get PT and OT consultations.  Vertebral artery occlusion, right Continue Plavix and Lipitor  Drug overdose, intentional (HCC) Continued psych follow-up.  Overdose on amitriptyline and Flexeril.  Extubated on 4/24.  Acute metabolic encephalopathy Likely secondary to drug overdose.  EEG reviewed and case  discussed with neurology.  No need for seizure medication at this point.  Hyponatremia Sodium now normal range  Hypomagnesemia Replaced  Rheumatoid arthritis [facility]) Patient's girlfriend states that he does injections on Fridays.  Will restart folic acid.  Restart gabapentin and tramadol.        Subjective: Patient seen after he worked with physical therapy and I gave him the sternal rub and he opened up his eyes.  Apparently he did a lot of work with physical therapy and also did well with swallowing evaluation this afternoon.  Admitted with stroke and overdose.  Physical Exam: Vitals:   06/13/22 0816 06/13/22 0900 06/13/22 1100 06/13/22 1200  BP:  132/88 122/86 124/82  Pulse:  (!) 108 (!) 108 (!) 107  Resp:  20 20 17   Temp:    98.4 F (36.9 C)  TempSrc:    Oral  SpO2:  98% 95% 97%  Weight: 79.8 kg     Height:       Physical Exam HENT:     Head: Normocephalic.     Mouth/Throat:     Pharynx: No oropharyngeal exudate.  Eyes:     General: Lids are normal.     Conjunctiva/sclera: Conjunctivae normal.  Cardiovascular:     Rate and Rhythm: Normal rate and regular rhythm.     Heart sounds: Normal heart sounds, S1 normal and S2 normal.  Pulmonary:     Breath sounds: No decreased breath sounds, wheezing, rhonchi or rales.  Abdominal:     Palpations: Abdomen is soft.     Tenderness: There is no abdominal tenderness.  Musculoskeletal:     Right lower leg: No swelling.     Left lower leg: No swelling.  Skin:    General: Skin is warm.     Findings: No rash.  Neurological:     Mental Status: He is lethargic.     Comments: Open eyes to sternal rub.     Data Reviewed: Creatinine 0.65, CBC normal range CT angio showed occlusion of the right vertebral artery V1 and proximal V2 segments, no intracranial arterial occlusion or high-grade stenosis, mild right hemispheric volume loss with ex vacuo dilatation of right lateral ventricle.  Old right ganglial capsular small vessel  infarct  Family Communication: Spoke with girlfriend on the phone  Disposition: Status is: Inpatient Remains inpatient appropriate because: Still very weak.  Speech therapy advancing diet.  Planned Discharge Destination: Rehab.  Patient's girlfriend interested in [place] for further psychiatric care    Time spent: 28 minutes  Author: Alford Highland, MD 06/13/2022 4:02 PM  For on call review [email or web address].

## 2022-06-13 NOTE — Progress Notes (Addendum)
Modified Barium Swallow Study  Patient Details  Name: Patrick Perez MRN: [id number] Date of Birth: May 08, 1965  Today's Date: 06/13/2022  Modified Barium Swallow completed.  Full report located under Chart Review in the Imaging Section.  History of Present Illness Pt is a 57 y.o. male with a PMH of  Alcohol use disorder and recent admit to [facility] hospital 02/2022, PTSD, T2DM, RA, and prior CVA w/ LEFT sided weakness (right thalamic infarct 06/01/2021 and right basal ganglia hemorrhage 06/08/2021) who presented to [facility] ED on 05/19/2022 with reports of worsening dysphagia. (Of note, he had been seen for complaints if slurred speech at [facility] 04/2022 but did not accept transfer to [facility] per chart notes.)  MRI revealed small infarct in the right midbrain. Plan to admit pt, however he refused admit then.  Per chart, pt presented to outside hospital in [place], [place] where he was diagnosed with aspiration pneumonia. On 06/08/2022, pt presented to [facility] ED d/t obtunded state from intentional overdose of 750 mg of amitriptyline and Flexeril. Pt intubated less than 24 hours.   CXR: Bilateral patchy parenchymal interstitial lung changes.  MRI: 7 mm focus of mild diffusion abnormality involving the right  paramedian midbrain, likely reflecting an evolving subacute small  vessel infarct. No associated hemorrhage or mass effect.  2. Chronic encephalomalacia and gliosis involving the right basal  ganglia/external capsule, likely reflecting changes of prior  hemorrhagic infarct. Associated wallerian degeneration.  3. Additional small remote lacunar infarct at the right thalamus.    Clinical Impression Patient presents w/ functional pharyngeal phase swallowing and oral phase dysphagia in setting of Baseline CVAs w/ residual Left sided weakness(05/2021). No aspiration nor laryngeal penetration were noted during this study. Pt endorsed a Baseline of mild dysphagia and "slurred speech" in past year but could not  describe details. Pt appears to present w/ Cognitive-communication decline at Baseline. There appears to have been recent changes in overall status in past 1-2 months per chart notes. Pt has been on a dysphagia level 2 w/ Nectar liquids once Alert/Awake State was consistent this admit. Cranial Nerve Impairment is suspected at Baseline; this impacts coordinated lingual/labial movements; Left strength/ROM.   Oral stage is characterized by decreased coordination/strength of lip closure w/ tsp/cup trials of thin liquids intermittently. This resulted in spillage to mid-chin x3 occurences. Min loss of bolus cohesion/containment into the lateral buccal cavity/floor noted w/ liquids; thins, nectar intermittently -- lacking bottom dentition also. Mastication of solid trial was c/b slower prolonged chewing, slightly disorganized. Recollection was complete for A-P transfer. Anterior to posterior transit of all trial consistencies was timely. Any trace+ oral/tongue surface residue is cleared by independent, dry swallow. Swallow initiation occurs at the level of the valleculae for most trials; posterior laryngeal surface of epiglottis w/ thins intermittently. Pharyngeal stage is noted for adequate tongue base retraction, adequate hyolaryngeal excursion, and adequate pharyngeal constriction. Epiglottic deflection is complete; there was No penetration nor aspiration. Pharyngeal stripping wave is complete. Amplitude/duration of cricopharyngeus opening is WFL. There is adequate/complete clearance through the cervical esophagus.    Consistencies tested were thin liquids x2 tsps, 2 single cup sip, 2 sequential sips, nectar x1 tsp, 2 single cup sip, honey x1 tsp, pudding x1 tsp, regular solid (1/2 graham cracker with pudding).   Recommend patient transition to a MINCED foods diet w/ thin liquids via CUP; aspiration precautions including helping to hold Cup when drinking. 100% Supervision w/ meals, pill swallowing. Educated pt  verbally and in handout form re: strategies including moistening  chopped, dry meats, alternating solids and liquids, single sips, reducing distractions at meals. Factors that may increase risk of adverse event in presence of aspiration Patrick Perez & Clearance Patrick Perez 2021): Reduced cognitive function (multiple CVAs per chart)    Swallow Evaluation Recommendations Recommendations: PO diet -- w/ 100% Supervision d/t Cognitive-communication deficits PO Diet Recommendation: Dysphagia 2 (Finely chopped);Thin liquids (Level 0) Liquid Administration via: Cup; No straw -- pt to help Hold Cup when drinking for Cognitive input Medication Administration: Whole meds with puree (vs need to Crush in Puree) Supervision: Staff to assist with self-feeding;Full assist for feeding;Full supervision/cueing for swallowing strategies (pt to help hold Cup to drink) Swallowing strategies  : Minimize environmental distractions;Slow rate;Small bites/sips;Check for anterior loss;Follow solids with liquids Postural changes: Position pt fully upright for meals;Stay upright 30-60 min after meals (Reflux precs.) Oral care recommendations: Oral care BID (2x/day);Staff/trained caregiver to provide oral care Recommended consults: Consider dietitian consultation       Jerilynn Som, MS, CCC-SLP Speech Language Pathologist Rehab Services; [facility] [phone] (ascom) Ashaunte Standley 06/13/2022,4:31 PM

## 2022-06-14 DIAGNOSIS — I639 Cerebral infarction, unspecified: Secondary | ICD-10-CM | POA: Diagnosis not present

## 2022-06-14 LAB — GLUCOSE, CAPILLARY
Glucose-Capillary: 101 mg/dL — ABNORMAL HIGH (ref 70–99)
Glucose-Capillary: 109 mg/dL — ABNORMAL HIGH (ref 70–99)
Glucose-Capillary: 122 mg/dL — ABNORMAL HIGH (ref 70–99)
Glucose-Capillary: 131 mg/dL — ABNORMAL HIGH (ref 70–99)
Glucose-Capillary: 131 mg/dL — ABNORMAL HIGH (ref 70–99)
Glucose-Capillary: 147 mg/dL — ABNORMAL HIGH (ref 70–99)

## 2022-06-14 MED ORDER — PANTOPRAZOLE SODIUM 40 MG PO TBEC
40.0000 mg | DELAYED_RELEASE_TABLET | Freq: Every day | ORAL | Status: DC
  Administered 2022-06-14 – 2022-06-18 (×5): 40 mg via ORAL
  Filled 2022-06-14 (×5): qty 1

## 2022-06-14 MED ORDER — DIPHENHYDRAMINE HCL 50 MG/ML IJ SOLN: 50.0000 mg | Freq: Three times a day (TID) | INTRAMUSCULAR | Status: DC | PRN

## 2022-06-14 MED ORDER — POLYETHYLENE GLYCOL 3350 17 G PO PACK
17.0000 g | PACK | Freq: Every day | ORAL | Status: DC
  Administered 2022-06-15 – 2022-06-19 (×5): 17 g via ORAL
  Filled 2022-06-14 (×5): qty 1

## 2022-06-14 MED ORDER — DIPHENHYDRAMINE HCL 50 MG/ML IJ SOLN
50.0000 mg | Freq: Once | INTRAMUSCULAR | Status: AC
  Administered 2022-06-14: 50 mg via INTRAVENOUS
  Filled 2022-06-14: qty 1

## 2022-06-14 MED ORDER — LORAZEPAM 2 MG PO TABS
2.0000 mg | ORAL_TABLET | Freq: Three times a day (TID) | ORAL | Status: DC | PRN
  Administered 2022-06-16 – 2022-06-19 (×7): 2 mg via ORAL
  Filled 2022-06-14 (×7): qty 1

## 2022-06-14 MED ORDER — LORAZEPAM 2 MG/ML IJ SOLN
2.0000 mg | Freq: Three times a day (TID) | INTRAMUSCULAR | Status: DC | PRN
  Administered 2022-06-15: 2 mg via INTRAVENOUS
  Filled 2022-06-14: qty 1

## 2022-06-14 MED ORDER — HALOPERIDOL LACTATE 5 MG/ML IJ SOLN
5.0000 mg | Freq: Three times a day (TID) | INTRAMUSCULAR | Status: DC | PRN
  Administered 2022-06-17: 5 mg via INTRAVENOUS
  Filled 2022-06-14: qty 1

## 2022-06-14 MED ORDER — HALOPERIDOL LACTATE 5 MG/ML IJ SOLN
5.0000 mg | Freq: Once | INTRAMUSCULAR | Status: AC
  Administered 2022-06-14: 5 mg via INTRAVENOUS
  Filled 2022-06-14: qty 1

## 2022-06-14 MED ORDER — HALOPERIDOL 5 MG PO TABS: 5.0000 mg | ORAL_TABLET | Freq: Three times a day (TID) | ORAL | Status: DC | PRN

## 2022-06-14 MED ORDER — HALOPERIDOL LACTATE 5 MG/ML IJ SOLN: 5.0000 mg | Freq: Once | INTRAMUSCULAR | Status: DC

## 2022-06-14 MED ORDER — MELATONIN 5 MG PO TABS
5.0000 mg | ORAL_TABLET | Freq: Every day | ORAL | Status: DC
  Administered 2022-06-14 – 2022-06-18 (×5): 5 mg via ORAL
  Filled 2022-06-14 (×5): qty 1

## 2022-06-14 MED ORDER — DOCUSATE SODIUM 100 MG PO CAPS
100.0000 mg | ORAL_CAPSULE | Freq: Two times a day (BID) | ORAL | Status: DC
  Administered 2022-06-14 – 2022-06-19 (×10): 100 mg via ORAL
  Filled 2022-06-14 (×10): qty 1

## 2022-06-14 MED ORDER — SERTRALINE HCL 50 MG PO TABS
25.0000 mg | ORAL_TABLET | Freq: Every day | ORAL | Status: DC
  Administered 2022-06-14 – 2022-06-19 (×6): 25 mg via ORAL
  Filled 2022-06-14 (×6): qty 1

## 2022-06-14 MED ORDER — SERTRALINE HCL 50 MG PO TABS: 50.0000 mg | ORAL_TABLET | Freq: Every day | ORAL | Status: DC

## 2022-06-14 MED ORDER — DIPHENHYDRAMINE HCL 25 MG PO CAPS
50.0000 mg | ORAL_CAPSULE | Freq: Three times a day (TID) | ORAL | Status: DC | PRN
  Administered 2022-06-15: 50 mg via ORAL
  Filled 2022-06-14: qty 2

## 2022-06-14 NOTE — Progress Notes (Signed)
Report called to RN room 156. Patient with suicide sitter at bedside. Will transfer to new room.

## 2022-06-14 NOTE — Progress Notes (Signed)
Speech Language Pathology Treatment: Dysphagia;Cognitive-Linquistic  Patient Details Name: [facility] MRN: [id number] DOB: 03/05/1965 Today's Date: 06/14/2022 Time: 4098-1191 SLP Time Calculation (min) (ACUTE ONLY): 20 min  Assessment / Plan / Recommendation Clinical Impression  Pt seen for ongoing diet toleration and dysarthria goals. Pt was sitting upright in bed with his lunch tray in front of him. Pt had straw in cup, education provided to pt, his girlfriend and sitter at bedside on recommend to consume thin liquids via cup - NOT A STRAW. Pt required moderate verbal and visual cues for attention to self-feeding. No overt s/s of aspiration and no oral residue observed throughout consumption. Given pt's decreased attention to task and inability to follow directions, recommend ST sign off for dysphagia management.   Skilled treatment session also focused on pt's dysarthria goals. SLP provided maximal multimodal assistance to use speech intelligibility strategies of slow rate and increased vocal intensity to achieve ~ 50% speech intelligibility when naming basic objects in his room. Progress limited d/t pt's lack of insight into deficits, tangential response and overall global deficits in mentation. For example, he responded "fork you" x 3 when prompted to name and repeat "fork." He also fluctuated between spanish and english with smiling and laughing when doing so. When communicating at the sentence level his speech intelligibility is <25%. To build awareness of deficits and decreased speech intelligibility, recommended that pt's girlfriend bring awareness to inability to understand pt.   ST to follow for mentation and speech intelligibility.     HPI HPI: Pt is a 57 y.o. male with a PMH of  Alcohol use disorder and recent admit to [facility], PTSD, T2DM, RA, and prior CVA w/ LEFT sided weakness (right thalamic infarct 06/01/2021 and right basal ganglia hemorrhage 06/08/2021) who presented to  [facility] on 05/19/2022 with reports of worsening dysphagia. (Of note, he had been seen for complaints if slurred speech at [facility] but did not accept transfer to [facility] per chart notes.)  MRI revealed small infarct in the right midbrain. Plan to admit pt, however he refused admit then.  Per chart, pt presented to [facility] where he was diagnosed with aspiration pneumonia. On 06/08/2022, pt presented to [facility] d/t obtunded state from intentional overdose of 750 mg of amitriptyline and Flexeril. Pt intubated less than 24 hours.  CXR: Bilateral patchy parenchymal interstitial lung changes. MRI: 7 mm focus of mild diffusion abnormality involving the right  paramedian midbrain, likely reflecting an evolving subacute small  vessel infarct. No associated hemorrhage or mass effect.  2. Chronic encephalomalacia and gliosis involving the right basal  ganglia/external capsule, likely reflecting changes of prior  hemorrhagic infarct. Associated wallerian degeneration.  3. Additional small remote lacunar infarct at the right thalamus.      SLP Plan  Continue with current plan of care      Recommendations for follow up therapy are one component of a multi-disciplinary discharge planning process, led by the attending physician.  Recommendations may be updated based on patient status, additional functional criteria and insurance authorization.    Recommendations  Diet recommendations: Dysphagia 2 (fine chop);Thin liquid Liquids provided via: Cup Medication Administration: Crushed with puree Supervision: Staff to assist with self feeding;Full supervision/cueing for compensatory strategies Compensations: Minimize environmental distractions;Slow rate;Small sips/bites;Lingual sweep for clearance of pocketing;Follow solids with liquid Postural Changes and/or Swallow Maneuvers: Seated upright 90 degrees;Out of bed for meals;Upright 30-60 min after meal  Oral  care BID   Frequent or constant Supervision/Assistance Dysphagia, oral phase (R13.11);Dysarthria and anarthria (R47.1)     Continue with current plan of care    Summar Mcglothlin B. Dreama Saa, M.S., CCC-SLP, Tree surgeon Certified Brain Injury Specialist [facility] [phone] Ascom [phone] Fax [phone]

## 2022-06-14 NOTE — Progress Notes (Signed)
Physical Therapy Treatment Patient Details Name: [facility] MRN: [id number] DOB: 27-May-1965 Today's Date: 06/14/2022   History of Present Illness 57 year old male with a past medical history significant for prior CVA (right thalamic infarct 06/01/2021 and right basal ganglia hemorrhage 06/08/2021) with left-sided hemiparesis, dysphagia, rheumatoid arthritis, type 2 diabetes mellitus who presents to [facility] ED on 06/08/2022 due to intentional drug overdose.  Patient is currently intubated and sedated, therefore history is obtained from patient's significant other at bedside along with chart review.    PT Comments    Patient agitated and attempting to get OOB on arrival. Difficulty redirecting at times during session and not following commands this date. Threatening to become combative with therapist and NT. Required minA+2 for sit to stand from EOB x 5 during session and required frequent cueing and assist to sit on EOB. Was able to take sidesteps towards [facility] with modA+2 with assist for L LE management at times and step by step verbal cues. Unable to redirect patient back into bed and with help of RN and NT returned patient to bed with totalA+2. Patient perseverating on "my girlfriend is going to take all of my money". Discharge plan remains appropriate.     Recommendations for follow up therapy are one component of a multi-disciplinary discharge planning process, led by the attending physician.  Recommendations may be updated based on patient status, additional functional criteria and insurance authorization.  Follow Up Recommendations  Can patient physically be transported by private vehicle: No    Assistance Recommended at Discharge Frequent or constant Supervision/Assistance  Patient can return home with the following A lot of help with walking and/or transfers;A lot of help with bathing/dressing/bathroom;Assistance with cooking/housework;Direct supervision/assist for medications management;Direct  supervision/assist for financial management;Assist for transportation;Help with stairs or ramp for entrance   Equipment Recommendations  Other (comment) (TBD)    Recommendations for Other Services       Precautions / Restrictions Precautions Precautions: Fall Restrictions Weight Bearing Restrictions: No     Mobility  Bed Mobility Overal bed mobility: Needs Assistance Bed Mobility: Supine to Sit, Sit to Supine     Supine to sit: Mod assist, +2 for safety/equipment Sit to supine: Total assist, +2 for physical assistance, +2 for safety/equipment   General bed mobility comments: attempting to get OOB on arrival but required modA to come into sitting EOB. TotalA+2 to return to be due to inability to follow commands and agitation/combativeness    Transfers Overall transfer level: Needs assistance Equipment used: 2 person hand held assist Transfers: Sit to/from Stand, Bed to chair/wheelchair/BSC Sit to Stand: +2 safety/equipment, Min assist   Step pivot transfers: Mod assist, +2 physical assistance, +2 safety/equipment       General transfer comment: able to stand from EOB minA+2 impulsively x 5 during session. With max cues and assist, able to take sidesteps towards [facility] with assist for advancing LLE at times.    Ambulation/Gait                   Stairs             Wheelchair Mobility    Modified Rankin (Stroke Patients Only) Modified Rankin (Stroke Patients Only) Pre-Morbid Rankin Score: No significant disability Modified Rankin: Severe disability     Balance Overall balance assessment: Needs assistance Sitting-balance support: Feet supported, Single extremity supported Sitting balance-Leahy Scale: Fair     Standing balance support: During functional activity, Bilateral upper extremity supported Standing balance-Leahy Scale: Zero  Cognition Arousal/Alertness: Awake/alert Behavior During Therapy:  Agitated, Restless, Impulsive Overall Cognitive Status: Impaired/Different from baseline Area of Impairment: Orientation, Attention, Following commands, Safety/judgement, Awareness, Problem solving, Rancho level               Rancho Levels of Cognitive Functioning Rancho Los Amigos Scales of Cognitive Functioning: Confused/Agitated Orientation Level: Disoriented to, Place, Time, Situation Current Attention Level: Focused   Following Commands: Follows one step commands inconsistently Safety/Judgement: Decreased awareness of safety, Decreased awareness of deficits Awareness: Intellectual Problem Solving: Slow processing, Difficulty sequencing, Requires verbal cues, Requires tactile cues General Comments: Patient agitated and combative during session. Difficult to redirect at times. Not following commands this session. RN present and Ativan given. Patient states "[place] at a warehouse" and perseverating on "my girlfriend is going to take all of my money"   Rancho Mirant Scales of Cognitive Functioning: Confused/Agitated    Exercises      General Comments        Pertinent Vitals/Pain Pain Assessment Pain Assessment: No/denies pain    Home Living                          Prior Function            PT Goals (current goals can now be found in the care plan section) Acute Rehab PT Goals PT Goal Formulation: With patient Time For Goal Achievement: 06/27/22 Potential to Achieve Goals: Fair Progress towards PT goals: Progressing toward goals    Frequency    Min 3X/week      PT Plan Current plan remains appropriate    Co-evaluation              AM-PAC PT "6 Clicks" Mobility   Outcome Measure  Help needed turning from your back to your side while in a flat bed without using bedrails?: Total Help needed moving from lying on your back to sitting on the side of a flat bed without using bedrails?: Total Help needed moving to and from a bed to a chair  (including a wheelchair)?: Total Help needed standing up from a chair using your arms (e.g., wheelchair or bedside chair)?: Total Help needed to walk in hospital room?: Total Help needed climbing 3-5 steps with a railing? : Total 6 Click Score: 6    End of Session   Activity Tolerance: Treatment limited secondary to agitation Patient left: in bed;with call bell/phone within reach;with nursing/sitter in room Nurse Communication: Mobility status PT Visit Diagnosis: Unsteadiness on feet (R26.81);Muscle weakness (generalized) (M62.81);Other abnormalities of gait and mobility (R26.89);Difficulty in walking, not elsewhere classified (R26.2);Hemiplegia and hemiparesis Hemiplegia - Right/Left: Left Hemiplegia - dominant/non-dominant: Non-dominant Hemiplegia - caused by: Cerebral infarction     Time: 1359-1417 PT Time Calculation (min) (ACUTE ONLY): 18 min  Charges:  $Therapeutic Activity: 8-22 mins                     Maylon Peppers, PT, DPT Physical Therapist - [facility]  [facility]    Jiyaan Steinhauser A Armas Mcbee 06/14/2022, 2:50 PM

## 2022-06-14 NOTE — Progress Notes (Signed)
[facility]  - [place] [facility]   PATIENT NAME: Patrick Perez    MR#:  [id number]  DATE OF BIRTH:  [birth year]  SUBJECTIVE:  no family at bedside. Patient seen earlier while he was working with PT. He is a 2% maximum assist. Very restless and agitated since he wanted to leave. Calling out for his girlfriend [place]. Patient had to be constantly redirected.  VITALS:  Blood pressure (!) 127/93, pulse (!) 110, temperature 98 F (36.7 C), temperature source Oral, resp. rate 18, height 5\' 7"  (1.702 m), weight 79.8 kg, SpO2 97 %.  PHYSICAL EXAMINATION:   GENERAL:  57 y.o.-year-old patient with no acute distress.  LUNGS: Normal breath sounds bilaterally, no wheezing CARDIOVASCULAR: S1, S2 normal. No murmur   ABDOMEN: Soft, nontender, nondistended. Bowel sounds present.  EXTREMITIES: No  edema b/l.    NEUROLOGIC: nonfocal  patient is alert and awake, restless, garbled speech with left facial droop. Left upper and lower extremity hemiparesis. SKIN: No obvious rash, lesion, or ulcer.   LABORATORY PANEL:  CBC Recent Labs  Lab 06/13/22 0421  WBC 6.2  HGB 14.1  HCT 39.9  PLT 208    Chemistries  Recent Labs  Lab 06/12/22 0420 06/13/22 0421  NA 134* 136  K 3.7 3.8  CL 104 105  CO2 24 23  GLUCOSE 120* 133*  BUN <5* 7  CREATININE 0.71 0.65  CALCIUM 8.7* 8.9  MG 1.6*  --   AST 15  --   ALT 15  --   ALKPHOS 81  --   BILITOT 0.8  --    Cardiac Enzymes No results for input(s): "TROPONINI" in the last 168 hours. RADIOLOGY:  DG Swallowing Func-Speech Pathology  Result Date: 06/13/2022 Table formatting from the original result was not included. Modified Barium Swallow Study Patient Details Name: Patrick Perez MRN: [id number] Date of Birth: [birth year] Today's Date: 06/13/2022 HPI/PMH: HPI: Pt is a 57 y.o. male with a PMH of  Alcohol use disorder and recent admit to [facility] hospital 02/2022, PTSD, T2DM, RA, and prior CVA w/ LEFT sided weakness (right thalamic infarct  06/01/2021 and right basal ganglia hemorrhage 06/08/2021) who presented to [facility] ED on 05/19/2022 with reports of worsening dysphagia. (Of note, he had been seen for complaints if slurred speech at [facility] 04/2022 but did not accept transfer to [facility] per chart notes.)  MRI revealed small infarct in the right midbrain. Plan to admit pt, however he refused admit then.  Per chart, pt presented to outside hospital in [place], [place] where he was diagnosed with aspiration pneumonia. On 06/08/2022, pt presented to [facility] ED d/t obtunded state from intentional overdose of 750 mg of amitriptyline and Flexeril. Pt intubated less than 24 hours.  CXR: Bilateral patchy parenchymal interstitial lung changes. MRI: 7 mm focus of mild diffusion abnormality involving the right  paramedian midbrain, likely reflecting an evolving subacute small  vessel infarct. No associated hemorrhage or mass effect.  2. Chronic encephalomalacia and gliosis involving the right basal  ganglia/external capsule, likely reflecting changes of prior  hemorrhagic infarct. Associated wallerian degeneration.  3. Additional small remote lacunar infarct at the right thalamus. Clinical Impression: Patient presents w/ functional pharyngeal phase swallowing and oral phase dysphagia in setting of Baseline CVAs w/ residual Left sided weakness(05/2021). No aspiration nor laryngeal penetration were noted during this study. Pt endorsed a Baseline of mild dysphagia and "slurred speech" in past year but could not describe details. Pt appears to present w/ Cognitive-communication decline  at Baseline. There appears to have been recent changes in overall status in past 1-2 months per chart notes. Pt has been on a dysphagia level 2 w/ Nectar liquids once Alert/Awake State was consistent this admit. Cranial Nerve Impairment is suspected at Baseline; this impacts coordinated lingual/labial movements; Left strength/ROM. Oral stage is characterized by decreased  coordination/strength of lip closure w/ tsp/cup trials of thin liquids intermittently. This resulted in spillage to mid-chin x3 occurences. Min loss of bolus cohesion/containment into the lateral buccal cavity/floor noted w/ liquids; thins, nectar intermittently -- lacking bottom dentition also. Mastication of solid trial was c/b slower prolonged chewing, slightly disorganized. Recollection was complete for A-P transfer. Anterior to posterior transit of all trial consistencies was timely. Any trace+ oral/tongue surface residue is cleared by independent, dry swallow. Swallow initiation occurs at the level of the valleculae for most trials; posterior laryngeal surface of epiglottis w/ thins intermittently. Pharyngeal stage is noted for adequate tongue base retraction, adequate hyolaryngeal excursion, and adequate pharyngeal constriction. Epiglottic deflection is complete; there was No penetration nor aspiration. Pharyngeal stripping wave is complete. Amplitude/duration of cricopharyngeus opening is WFL. There is adequate/complete clearance through the cervical esophagus.  Consistencies tested were thin liquids x2 tsps, 2 single cup sip, 2 sequential sips, nectar x1 tsp, 2 single cup sip, honey x1 tsp, pudding x1 tsp, regular solid (1/2 graham cracker with pudding).  Recommend patient transition to a MINCED foods diet w/ thin liquids via CUP; aspiration precautions including helping to hold Cup when drinking. 100% Supervision w/ meals, pill swallowing. Educated pt verbally and in handout form re: strategies including moistening chopped, dry meats, alternating solids and liquids, single sips, reducing distractions at meals. Factors that may increase risk of adverse event in presence of aspiration Rubye Oaks & Clearance Coots 2021): Factors that may increase risk of adverse event in presence of aspiration Rubye Oaks & Clearance Coots 2021): Reduced cognitive function (multiple CVAs per chart) Recommendations/Plan: Swallowing Evaluation  Recommendations Swallowing Evaluation Recommendations Recommendations: PO diet -- 100% Supervision PO Diet Recommendation: Dysphagia 2 (Finely chopped); Thin liquids (Level 0) Liquid Administration via: Cup; No straw -- pt to help Hold Cup when drinking for Cognitive input Medication Administration: Whole meds with puree (vs need to Crush in Puree) Supervision: Staff to assist with self-feeding; Full assist for feeding; Full supervision/cueing for swallowing strategies (pt to help hold Cup to drink) Swallowing strategies  : Minimize environmental distractions; Slow rate; Small bites/sips; Check for anterior loss; Follow solids with liquids Postural changes: Position pt fully upright for meals; Stay upright 30-60 min after meals (Reflux precs.) Oral care recommendations: Oral care BID (2x/day); Staff/trained caregiver to provide oral care Recommended consults: Consider dietitian consultation Caregiver Recommendations: 100% Supervision at meals; support w/ feeding at meals. Treatment Plan Treatment Plan Treatment recommendations: Therapy as outlined in treatment plan below Follow-up recommendations: Follow physicians's recommendations for discharge plan and follow up therapies Recommendations Comment: 100% Supervision at meals Functional status assessment: Patient has had a recent decline in their functional status and demonstrates the ability to make significant improvements in function in a reasonable and predictable amount of time. Treatment frequency: Min 2x/week Treatment duration: 2 weeks Interventions: Aspiration precaution training; Oropharyngeal exercises; Patient/family education; Diet toleration management by SLP; Compensatory techniques Recommendations Recommendations for follow up therapy are one component of a multi-disciplinary discharge planning process, led by the attending physician.  Recommendations may be updated based on patient status, additional functional criteria and insurance authorization.  Assessment: Orofacial Exam: Orofacial Exam Oral Cavity: Oral Hygiene: [facility] Oral  Cavity - Dentition: Adequate natural dentition (missing front tooth) Orofacial Anatomy: WFL Oral Motor/Sensory Function: Suspected cranial nerve impairment CN V - Trigeminal: Left motor impairment CN VII - Facial: Left motor impairment CN IX - Glossopharyngeal, CN X - Vagus: WFL CN XII - Hypoglossal: Left motor impairment Anatomy: Anatomy: WFL Boluses Administered: Boluses Administered Boluses Administered: Thin liquids (Level 0); Mildly thick liquids (Level 2, nectar thick); Moderately thick liquids (Level 3, honey thick); Puree; Solid  Oral Impairment Domain: Oral Impairment Domain Lip Closure: Escape progressing to mid-chin (w/ thins by TSP; escape from interlabial space x1) Tongue control during bolus hold: Escape to lateral buccal cavity/floor of mouth (thins, nectar intermittently -- lacking bottom dentition also) Bolus preparation/mastication: Slow prolonged chewing/mashing with complete recollection; Disorganized chewing/mashing with solid pieces of bolus unchewed (w/ solid trial) Bolus transport/lingual motion: Brisk tongue motion (w/ all) Oral residue: Trace residue lining oral structures (intermittent) Location of oral residue : Tongue (cleared w/ independent f/u swallow) Initiation of pharyngeal swallow : Posterior laryngeal surface of the epiglottis (thins)  Pharyngeal Impairment Domain: Pharyngeal Impairment Domain Soft palate elevation: No bolus between soft palate (SP)/pharyngeal wall (PW) Laryngeal elevation: Complete superior movement of thyroid cartilage with complete approximation of arytenoids to epiglottic petiole Anterior hyoid excursion: Complete anterior movement Epiglottic movement: Complete inversion (majority of trials) Laryngeal vestibule closure: Complete, no air/contrast in laryngeal vestibule Pharyngeal stripping wave : Present - complete Pharyngeal contraction (A/P view only): N/A Pharyngoesophageal  segment opening: Complete distension and complete duration, no obstruction of flow Tongue base retraction: No contrast between tongue base and posterior pharyngeal wall (PPW) Pharyngeal residue: Complete pharyngeal clearance Location of pharyngeal residue: N/A  Esophageal Impairment Domain: Esophageal Impairment Domain Esophageal clearance upright position: Complete clearance, esophageal coating Pill: Esophageal Impairment Domain Esophageal clearance upright position: Complete clearance, esophageal coating Penetration/Aspiration Scale Score: Penetration/Aspiration Scale Score 1.  Material does not enter airway: Thin liquids (Level 0); Mildly thick liquids (Level 2, nectar thick); Moderately thick liquids (Level 3, honey thick); Puree; Solid Compensatory Strategies: Compensatory Strategies Compensatory strategies: No   General Information: Caregiver present: No  Diet Prior to this Study: Dysphagia 1 (pureed); Mildly thick liquids (Level 2, nectar thick)   Temperature : Normal   Respiratory Status: WFL   Supplemental O2: None (Room air)   History of Recent Intubation: Yes  Behavior/Cognition: Alert; Cooperative; Pleasant mood; Distractible; Requires cueing (baseline cognitive decline) Self-Feeding Abilities: Needs hand-over-hand assist for feeding; Dependent for feeding (can help hold Cup to drink) Baseline vocal quality/speech: Hypophonia/low volume Volitional Cough: Able to elicit Volitional Swallow: Able to elicit Exam Limitations: No limitations Goal Planning: Prognosis for improved oropharyngeal function: Fair Barriers to Reach Goals: Cognitive deficits; Language deficits; Time post onset; Severity of deficits; Overall medical prognosis Barriers/Prognosis Comment: declined Cognitive functioning s/p multiple CVAs; Left sided weakness; need for Supervision at meals Patient/Family Stated Goal: none stated Consulted and agree with results and recommendations: Patient; Physician; Nurse; Dietitian (Sitter) Pain: Pain  Assessment Pain Assessment: No/denies pain Faces Pain Scale: 0 Facial Expression: 0 Body Movements: 0 Muscle Tension: 0 Compliance with ventilator (intubated pts.): N/A Vocalization (extubated pts.): 0 CPOT Total: 0 End of Session: Start Time:SLP Start Time (ACUTE ONLY): 1300 Stop Time: SLP Stop Time (ACUTE ONLY): 1400 Time Calculation:SLP Time Calculation (min) (ACUTE ONLY): 60 min Charges: SLP Evaluations $ SLP Speech Visit: 1 Visit SLP Evaluations $BSS Swallow: 1 Procedure $MBS Swallow: 1 Procedure $ SLP EVAL LANGUAGE/SOUND PRODUCTION: 1 Procedure $Swallowing Treatment: 1 Procedure SLP visit diagnosis: SLP Visit Diagnosis: Dysphagia,  oral phase (R13.11) Past Medical History: Past Medical History: Diagnosis Date  Anxiety   Arthritis   rheumatoid  Depression   Diabetes mellitus without complication (HCC)   Stroke (HCC)  Past Surgical History: Past Surgical History: Procedure Laterality Date  TIBIA FRACTURE SURGERY Left   TONSILLECTOMY   Jerilynn Som, MS, CCC-SLP Speech Language Pathologist Rehab Services; [facility] [phone] (ascom) Watson,Katherine 06/13/2022, 4:38 PM  CT ANGIO HEAD NECK W WO CM  Result Date: 06/12/2022 CLINICAL DATA:  Acute neurologic deficit EXAM: CT ANGIOGRAPHY HEAD AND NECK WITH AND WITHOUT CONTRAST TECHNIQUE: Multidetector CT imaging of the head and neck was performed using the standard protocol during bolus administration of intravenous contrast. Multiplanar CT image reconstructions and MIPs were obtained to evaluate the vascular anatomy. Carotid stenosis measurements (when applicable) are obtained utilizing NASCET criteria, using the distal internal carotid diameter as the denominator. RADIATION DOSE REDUCTION: This exam was performed according to the departmental dose-optimization program which includes automated exposure control, adjustment of the mA and/or kV according to patient size and/or use of iterative reconstruction technique. CONTRAST:  75mL OMNIPAQUE IOHEXOL  350 MG/ML SOLN COMPARISON:  Brain MRI 06/09/2022 MRA neck 06/01/2021 FINDINGS: CT HEAD FINDINGS Brain: There is no mass, hemorrhage or extra-axial collection. Mild right hemispheric volume loss with ex vacuo dilatation of the right lateral ventricle. Old right gangliocapsular small vessel infarct. The brain parenchyma is normal. Skull: The visualized skull base, calvarium and extracranial soft tissues are normal. Sinuses/Orbits: No fluid levels or advanced mucosal thickening of the visualized paranasal sinuses. No mastoid or middle ear effusion. The orbits are normal. CTA NECK FINDINGS SKELETON: There is no bony spinal canal stenosis. No lytic or blastic lesion. OTHER NECK: Normal pharynx, larynx and major salivary glands. No cervical lymphadenopathy. Unremarkable thyroid gland. UPPER CHEST: No pneumothorax or pleural effusion. No nodules or masses. AORTIC ARCH: There is no calcific atherosclerosis of the aortic arch. There is no aneurysm, dissection or hemodynamically significant stenosis of the visualized portion of the aorta. Conventional 3 vessel aortic branching pattern. The visualized proximal subclavian arteries are widely patent. RIGHT CAROTID SYSTEM: Normal without aneurysm, dissection or stenosis. LEFT CAROTID SYSTEM: Normal without aneurysm, dissection or stenosis. VERTEBRAL ARTERIES: Left dominant configuration. The right vertebral artery V1 and proximal V2 segments are occluded with reconstitution of the distal V2 segment. The left vertebral artery is normal. CTA HEAD FINDINGS POSTERIOR CIRCULATION: --Vertebral arteries: Normal V4 segments. --Inferior cerebellar arteries: Normal. --Basilar artery: Normal. --Superior cerebellar arteries: Normal. --Posterior cerebral arteries (PCA): Normal. ANTERIOR CIRCULATION: --Intracranial internal carotid arteries: Normal. --Anterior cerebral arteries (ACA): Normal. Both A1 segments are present. Patent anterior communicating artery (a-comm). --Middle cerebral  arteries (MCA): Normal. VENOUS SINUSES: As permitted by contrast timing, patent. ANATOMIC VARIANTS: None Review of the MIP images confirms the above findings. IMPRESSION: 1. Occlusion of the right vertebral artery V1 and proximal V2 segments with reconstitution of the distal V2 segment. This is age indeterminate but new since 06/01/2021. 2. No intracranial arterial occlusion or high-grade stenosis. 3. Mild right hemispheric volume loss with ex vacuo dilatation of the right lateral ventricle. Old right gangliocapsular small vessel infarct. Electronically Signed   By: Deatra Robinson M.D.   On: 06/12/2022 22:41    Assessment and Plan 57 year old male with a past medical history significant for prior CVA (right thalamic infarct 06/01/2021 and right basal ganglia hemorrhage 06/08/2021) with left-sided hemiparesis, dysphagia, rheumatoid arthritis, type 2 diabetes mellitus who presents to [facility] ED on 06/08/2022 due to intentional drug overdose.  Patient is currently intubated and sedated, therefore history is obtained from patient's significant other at bedside along with chart review.  Patient's significant other at bedside reports that the patient called her and his mother today telling them that he had taken too many medications intentionally.   4/29 Echocardiogram showed a normal ejection fraction.  EEG did not have evidence of a left echogenicity arising from the right frontal central region secondary to encephalomalacia.  Neurology does not want to start any antiseizure medications.  Speech therapy put on a dysphagia 1 diet.  Will get physical therapy and Occupational Therapy consultations.   4/30.  CT angiogram shows occlusion of the right vertebral artery.  Patient worked with physical therapy.  Patient did well with a swallow evaluation today and upgraded on diet.  5/1: patient quite agitated and restless. Wants to leave. Not able to do much with therapy he is a 2 person assist. Calling out for his  girlfriend. Discussed with psychiatry nurse practitioner regarding his agitation. Added PRN Ativan and Haldol. Started on Seroquel by psych -- psych recommending inpatient behavioral medicine admission once medically stable   Acute stroke due to ischemia [facility]) --MRI of the brain shows a 7 mm focus of diffusion abnormality involving the right paramedian midbrain likely reflecting evolving subacute small vessel infarct. --  Neurology recommended Plavix.   --LDL 86.  Will add Lipitor.   --Placed on a diet --tolerating ok --PT and OT evaluation noted. Patient is maximum to person assist he will benefit from rehab.   Vertebral artery occlusion, right --Continue Plavix and Lipitor   Drug overdose, intentional (HCC) --Continued psych follow-up.  Overdose on amitriptyline and Flexeril.  Extubated on 4/24.  -- Psychiatry nurse practitioner recommends Seroquel which has been started. They also recommend inpatient behavioral medicine transfer once medically clear`. D/w Christal Bennett  Acute metabolic encephalopathy --Likely secondary to drug overdose.  EEG reviewed and case discussed with neurology.  No need for seizure medication at this point.   Hyponatremia Sodium now normal range   Hypomagnesemia Replaced   Rheumatoid arthritis [facility]) --Patient's girlfriend states that he does injections on Fridays.  Will restart folic acid.  Restart gabapentin and tramadol.     Family communication :none today Consults : neurology, psychiatry CODE STATUS: full DVT Prophylaxis : enoxaparin Level of care: Med-Surg Status is: Inpatient Remains inpatient appropriate because: acute CVA, intentional drug overdose. Will monitor for another day or two and if remains stable transfer to inpatient behavioral medicine    TOTAL TIME TAKING CARE OF THIS PATIENT: 35 minutes.  >50% time spent on counselling and coordination of care  Note: This dictation was prepared with Dragon dictation along with smaller  phrase technology. Any transcriptional errors that result from this process are unintentional.  Enedina Finner M.D    [facility]   CC: Primary care physician; [name], MD

## 2022-06-15 DIAGNOSIS — I639 Cerebral infarction, unspecified: Secondary | ICD-10-CM | POA: Diagnosis not present

## 2022-06-15 LAB — GLUCOSE, CAPILLARY
Glucose-Capillary: 116 mg/dL — ABNORMAL HIGH (ref 70–99)
Glucose-Capillary: 133 mg/dL — ABNORMAL HIGH (ref 70–99)
Glucose-Capillary: 142 mg/dL — ABNORMAL HIGH (ref 70–99)
Glucose-Capillary: 126 mg/dL — ABNORMAL HIGH (ref 70–99)
Glucose-Capillary: 137 mg/dL — ABNORMAL HIGH (ref 70–99)

## 2022-06-15 NOTE — Consult Note (Signed)
[facility] Face-to-Face Psychiatry Consult   Reason for Consult:  Suicide attempt / Drug overdose Referring Physician:  Harlon Ditty, NP Patient Identification: Patrick Perez MRN:  [id number] Principal Diagnosis: Acute stroke due to ischemia [facility]) Diagnosis:  Principal Problem:   Acute stroke due to ischemia [facility]) Active Problems:   Rheumatoid arthritis (HCC)   Hyponatremia   Drug overdose, intentional (HCC)   Acute metabolic encephalopathy   Hypomagnesemia   Vertebral artery occlusion, right   Total Time spent with patient: 45 minutes  Subjective:   Patrick Perez is a 57 y.o. male patient admitted with intentional drug overdose.  HPI:  Patrick Perez is a 57 y.o. male with a history of major depressive disorder, CVA presented to the emergency department unresponsive on 04/25 via EMS due to intentional drug overdose.  The patient is currently on the medical floor.    On assessment today, patient observed lying in bed, awake with a sitter present for suicide precautions.  His speech is garbled and slurred making it somewhat difficult to understand him. He appears tearful and emotionally distressed.  The patient denies experiencing auditory or visual hallucinations and does not display any evidence of response to internal stimuli.  He denies having suicidal or homicidal ideations at this time.  He reports dissatisfaction with the food quality, affecting his appetite.  He becomes tearful when mentioning his mother and stepfather, but the details of his concerns are unclear due to his slurred and garbled speech. The patient is followed by physical therapy (PT), who is recommending rehabilitation.  A medical psychiatric facility is encouraged to accommodate both.  His mental health needs and physical deficits.  The medical team encouraged to coordinate with transition of care (TOC) and psychiatric team to facilitate appropriate placement.   Past Psychiatric History: Anxiety, depression   Risk to  Self:  Yes  Risk to Others:   Prior Inpatient Therapy:   Prior Outpatient Therapy:    Past Medical History:  Past Medical History:  Diagnosis Date   Anxiety    Arthritis    rheumatoid   Depression    Diabetes mellitus without complication (HCC)    Stroke (HCC)     Past Surgical History:  Procedure Laterality Date   TIBIA FRACTURE SURGERY Left    TONSILLECTOMY     Family History:  Family History  Problem Relation Age of Onset   Diabetes Mother    Cancer Father        prostate   Cancer Paternal Grandfather        colon   Family Psychiatric  History: No pertinent family psychiatric history on file. Social History:  Social History   Substance and Sexual Activity  Alcohol Use Yes   Alcohol/week: 2.0 standard drinks of alcohol   Types: 2 Cans of beer per week     Social History   Substance and Sexual Activity  Drug Use Yes   Frequency: 7.0 times per week   Types: Marijuana   Comment: smokes once a day     Social History   Socioeconomic History   Marital status: Divorced    Spouse name: Not on file   Number of children: 3   Years of education: Not on file   Highest education level: GED or equivalent  Occupational History   Occupation: Disabled  Tobacco Use   Smoking status: Former    Packs/day: 0.50    Years: 27.00    Additional pack years: 0.00    Total pack years: 13.50  Types: Cigarettes    Quit date: 04/27/2020    Years since quitting: 2.1   Smokeless tobacco: Never   Tobacco comments:    using nicorett gum  Vaping Use   Vaping Use: Never used  Substance and Sexual Activity   Alcohol use: Yes    Alcohol/week: 2.0 standard drinks of alcohol    Types: 2 Cans of beer per week   Drug use: Yes    Frequency: 7.0 times per week    Types: Marijuana    Comment: smokes once a day    Sexual activity: Yes  Other Topics Concern   Not on file  Social History Narrative   Not on file   Social Determinants of Health   Financial Resource Strain: Low  Risk  (08/29/2017)   Overall Financial Resource Strain (CARDIA)    Difficulty of Paying Living Expenses: Not hard at all  Food Insecurity: No Food Insecurity (08/29/2017)   Hunger Vital Sign    Worried About Running Out of Food in the Last Year: Never true    Ran Out of Food in the Last Year: Never true  Transportation Needs: No Transportation Needs (08/29/2017)   PRAPARE - Administrator, Civil Service (Medical): No    Lack of Transportation (Non-Medical): No  Physical Activity: Inactive (08/29/2017)   Exercise Vital Sign    Days of Exercise per Week: 0 days    Minutes of Exercise per Session: 0 min  Stress: No Stress Concern Present (08/29/2017)   [employer] of Occupational Health - Occupational Stress Questionnaire    Feeling of Stress : Only a little  Social Connections: Unknown (08/29/2017)   Social Connection and Isolation Panel [NHANES]    Frequency of Communication with Friends and Family: Patient declined    Frequency of Social Gatherings with Friends and Family: Patient declined    Attends Religious Services: Patient declined    Database administrator or Organizations: Patient declined    Attends Banker Meetings: Patient declined    Marital Status: Divorced   Additional Social History:    Allergies:   Allergies  Allergen Reactions   Oxycodone Hives   Oxycodone-Acetaminophen Anxiety   Quetiapine Rash    Labs:  Results for orders placed or performed during the hospital encounter of 06/08/22 (from the past 48 hour(s))  Glucose, capillary     Status: Abnormal   Collection Time: 06/13/22  4:40 PM  Result Value Ref Range   Glucose-Capillary 144 (H) 70 - 99 mg/dL    Comment: Glucose reference range applies only to samples taken after fasting for at least 8 hours.  Glucose, capillary     Status: Abnormal   Collection Time: 06/13/22  8:14 PM  Result Value Ref Range   Glucose-Capillary 150 (H) 70 - 99 mg/dL    Comment: Glucose reference range  applies only to samples taken after fasting for at least 8 hours.  Glucose, capillary     Status: Abnormal   Collection Time: 06/14/22 12:26 AM  Result Value Ref Range   Glucose-Capillary 131 (H) 70 - 99 mg/dL    Comment: Glucose reference range applies only to samples taken after fasting for at least 8 hours.  Glucose, capillary     Status: Abnormal   Collection Time: 06/14/22  7:57 AM  Result Value Ref Range   Glucose-Capillary 101 (H) 70 - 99 mg/dL    Comment: Glucose reference range applies only to samples taken after fasting for at least 8  hours.  Glucose, capillary     Status: Abnormal   Collection Time: 06/14/22 11:21 AM  Result Value Ref Range   Glucose-Capillary 147 (H) 70 - 99 mg/dL    Comment: Glucose reference range applies only to samples taken after fasting for at least 8 hours.  Glucose, capillary     Status: Abnormal   Collection Time: 06/14/22  3:58 PM  Result Value Ref Range   Glucose-Capillary 131 (H) 70 - 99 mg/dL    Comment: Glucose reference range applies only to samples taken after fasting for at least 8 hours.  Glucose, capillary     Status: Abnormal   Collection Time: 06/14/22  8:08 PM  Result Value Ref Range   Glucose-Capillary 122 (H) 70 - 99 mg/dL    Comment: Glucose reference range applies only to samples taken after fasting for at least 8 hours.  Glucose, capillary     Status: Abnormal   Collection Time: 06/14/22 11:23 PM  Result Value Ref Range   Glucose-Capillary 109 (H) 70 - 99 mg/dL    Comment: Glucose reference range applies only to samples taken after fasting for at least 8 hours.  Glucose, capillary     Status: Abnormal   Collection Time: 06/15/22  3:48 AM  Result Value Ref Range   Glucose-Capillary 116 (H) 70 - 99 mg/dL    Comment: Glucose reference range applies only to samples taken after fasting for at least 8 hours.  Glucose, capillary     Status: Abnormal   Collection Time: 06/15/22  8:05 AM  Result Value Ref Range   Glucose-Capillary  133 (H) 70 - 99 mg/dL    Comment: Glucose reference range applies only to samples taken after fasting for at least 8 hours.  Glucose, capillary     Status: Abnormal   Collection Time: 06/15/22 11:31 AM  Result Value Ref Range   Glucose-Capillary 142 (H) 70 - 99 mg/dL    Comment: Glucose reference range applies only to samples taken after fasting for at least 8 hours.    Current Facility-Administered Medications  Medication Dose Route Frequency Provider Last Rate Last Admin   acetaminophen (TYLENOL) tablet 650 mg  650 mg Oral Q6H PRN Alford Highland, MD   650 mg at 06/13/22 2126   atorvastatin (LIPITOR) tablet 40 mg  40 mg Oral Daily Alford Highland, MD   40 mg at 06/15/22 1610   clopidogrel (PLAVIX) tablet 75 mg  75 mg Oral Daily Jefferson Fuel, MD   75 mg at 06/15/22 9604   diphenhydrAMINE (BENADRYL) capsule 50 mg  50 mg Oral TID PRN Emmaus Brandi H, NP       Or   diphenhydrAMINE (BENADRYL) injection 50 mg  50 mg Intravenous TID PRN Leonides Minder H, NP       docusate (COLACE) 50 MG/5ML liquid 100 mg  100 mg Per Tube BID PRN Erin Fulling, MD       docusate sodium (COLACE) capsule 100 mg  100 mg Oral BID Lowella Bandy, RPH   100 mg at 06/15/22 0833   enoxaparin (LOVENOX) injection 40 mg  40 mg Subcutaneous Q24H Harlon Ditty D, NP   40 mg at 06/14/22 2145   folic acid (FOLVITE) tablet 1 mg  1 mg Oral Daily Alford Highland, MD   1 mg at 06/15/22 5409   gabapentin (NEURONTIN) capsule 300 mg  300 mg Oral TID Alford Highland, MD   300 mg at 06/15/22 0833   haloperidol (HALDOL) tablet 5 mg  5 mg Oral TID PRN Elston Aldape H, NP       Or   haloperidol lactate (HALDOL) injection 5 mg  5 mg Intravenous TID PRN Kylea Berrong H, NP       LORazepam (ATIVAN) tablet 2 mg  2 mg Oral TID PRN Shana Younge H, NP       Or   LORazepam (ATIVAN) injection 2 mg  2 mg Intravenous TID PRN Jolan Upchurch H, NP       melatonin tablet 5 mg  5 mg Oral QHS Jean Alejos H, NP   5  mg at 06/14/22 2145   Oral care mouth rinse  15 mL Mouth Rinse PRN Earley Brooke, MD       pantoprazole (PROTONIX) EC tablet 40 mg  40 mg Oral QHS Lowella Bandy, RPH   40 mg at 06/14/22 2145   polyethylene glycol (MIRALAX / GLYCOLAX) packet 17 g  17 g Per Tube Daily PRN Harlon Ditty D, NP   17 g at 06/15/22 0834   polyethylene glycol (MIRALAX / GLYCOLAX) packet 17 g  17 g Oral Daily Lowella Bandy, RPH   17 g at 06/15/22 9147   sertraline (ZOLOFT) tablet 25 mg  25 mg Oral Daily Geoge Lawrance H, NP   25 mg at 06/15/22 0833   [START ON 06/21/2022] sertraline (ZOLOFT) tablet 50 mg  50 mg Oral Daily Abron Neddo H, NP       traMADol (ULTRAM) tablet 50 mg  50 mg Oral Q12H PRN Alford Highland, MD   50 mg at 06/15/22 8295    Musculoskeletal: Strength & Muscle Tone: decreased and Chart review reveals left-sided hemiparesis  Gait & Station:  Did not assess  Patient leans: N/A            Psychiatric Specialty Exam:  Presentation  General Appearance:  Casual  Eye Contact: Fair; Other (comment) (tracks speech)  Speech: Garbled; Slow; Slurred  Speech Volume: Decreased  Handedness:No data recorded  Mood and Affect  Mood: Euthymic  Affect: Flat   Thought Process  Thought Processes: Other (comment) (unable to fully assess due to level of impairment)  Descriptions of Associations:Loose (unable to fully assess due to level of impairment)  Orientation:Partial (place, year, situation)  Thought Content:Other (comment) (unable to fully assess due to level of impairment)  History of Schizophrenia/Schizoaffective disorder:No data recorded Duration of Psychotic Symptoms:No data recorded Hallucinations:No data recorded Ideas of Reference:None  Suicidal Thoughts:No data recorded Homicidal Thoughts:No data recorded  Sensorium  Memory: Immediate Fair  Judgment: Other (comment) (unable to fully assess due to level of impairment)  Insight: Other (comment)  (unable to fully assess due to level of impairment)   Executive Functions  Concentration: Fair  Attention Span: Fair  Recall: [employer] of Knowledge: Fair  Language: Poor   Psychomotor Activity  Psychomotor Activity:No data recorded  Assets  Assets: Resilience; Desire for Improvement   Sleep  Sleep:No data recorded  Physical Exam: Physical Exam Vitals and nursing note reviewed.  HENT:     Head: Normocephalic.     Nose: Nose normal.  Pulmonary:     Effort: Pulmonary effort is normal.  Musculoskeletal:     Cervical back: Normal range of motion.  Neurological:     Mental Status: He is alert.     Motor: Weakness present.     Comments: Chart review reveals left-sided hemiparesis   Psychiatric:        Attention and Perception: He does not  perceive auditory or visual hallucinations.        Mood and Affect: Mood is anxious and depressed. Affect is tearful.        Speech: Speech is slurred.        Behavior: Behavior is cooperative.        Thought Content: Thought content is delusional. Thought content is not paranoid. Thought content does not include homicidal or suicidal ideation.        Judgment: Judgment is impulsive.    ROS Blood pressure (!) 120/97, pulse (!) 109, temperature 98 F (36.7 C), resp. rate 16, height 5\' 7"  (1.702 m), weight 80 kg, SpO2 98 %. Body mass index is 27.62 kg/m.  Treatment Plan Summary: Case reviewed with attending MD, Dr. Allena Katz.  Daily contact with patient to assess and evaluate symptoms and progress in treatment, Medication management, and Plan : Inpatient psychiatric hospitalization is recommended following medical clearance due to the significance of the intentional overdose.   The medical team encouraged to coordinate with transition of care (TOC) and psychiatric team to facilitate appropriate placement. The psych team will continue to assess and  manage any behavioral concerns. Initiated sertraline 25 mg daily for 1 week, then  increase to 50 mg daily. Initiated melatonin 5 mg daily at bedtime to aid in sleep support.   Initiated imminent risk/agitation protocol: Ativan 2 mg PO or IV TID PRN Haloperidol 5 mg PO or IV TID PRN  Diphenhydramine 50 mg PO or IV TID PRN    Disposition:  Recommend psychiatric Inpatient admission when medically cleared. Supportive therapy provided about ongoing stressors.  Norma Fredrickson, NP 06/15/2022 12:27 PM

## 2022-06-15 NOTE — TOC Progression Note (Signed)
Transition of Care [facility]) - Progression Note    Patient Details  Name: Patrick Perez MRN: [id number] Date of Birth: 1966-02-04  [facility]) CM/SW Contact  Marlowe Sax, RN Phone Number: 06/15/2022, 3:14 PM  Clinical Narrative:     The patient is a 2+ assist, he is currently IVC and has a Comptroller Will require a medical psych unit TOC assisting and looking for a medical psych unit, will potentially be DTP  Expected Discharge Plan:  (TBD) Barriers to Discharge: Psych Bed not available  Expected Discharge Plan and Services   Discharge Planning Services: CM Consult   Living arrangements for the past 2 months: Single Family Home                 DME Arranged:  (tbd)                     Social Determinants of Health (SDOH) Interventions SDOH Screenings   Food Insecurity: No Food Insecurity (08/29/2017)  Transportation Needs: No Transportation Needs (08/29/2017)  Depression (PHQ2-9): Medium Risk (07/07/2021)  Financial Resource Strain: Low Risk  (08/29/2017)  Physical Activity: Inactive (08/29/2017)  Social Connections: Unknown (08/29/2017)  Stress: No Stress Concern Present (08/29/2017)  Tobacco Use: Medium Risk (05/09/2022)    Readmission Risk Interventions     No data to display

## 2022-06-15 NOTE — Progress Notes (Signed)
Occupational Therapy Treatment Patient Details Name: Patrick Perez MRN: [id number] DOB: 1965/11/12 Today's Date: 06/15/2022   History of present illness 57 year old male with a past medical history significant for prior CVA (right thalamic infarct 06/01/2021 and right basal ganglia hemorrhage 06/08/2021) with left-sided hemiparesis, dysphagia, rheumatoid arthritis, type 2 diabetes mellitus who presents to [facility] ED on 06/08/2022 due to intentional drug overdose.  Patient is currently intubated and sedated, therefore history is obtained from patient's significant other at bedside along with chart review.   OT comments  Upon entering the room, pt supine in bed and agreeable to OT intervention. NT present in room having just finished assisting pt with self care needs. Pt performing supine >sit with mod A for L LE and trunk support to EOB. Pt wanting to get to recliner chair. He remains impulsive during session. Pt stands with mod A from therapist and takes 4-5 side steps to the L towards recliner chair. Pt needing mod-max multimodal cues to sequence mobility along with physical assistance to advance L LE. Pt seated in recliner chair and then demanding to be allowed to ambulate in room as pt believes he can do so independently. Pt seated in chair with chair alarm belt for safety and sitter remaining in room for safety. Plan for pt to sit up in chair for next meal.    Recommendations for follow up therapy are one component of a multi-disciplinary discharge planning process, led by the attending physician.  Recommendations may be updated based on patient status, additional functional criteria and insurance authorization.    Assistance Recommended at Discharge Frequent or constant Supervision/Assistance  Patient can return home with the following  Two people to help with walking and/or transfers;Two people to help with bathing/dressing/bathroom;Assist for transportation;Assistance with cooking/housework;Help with  stairs or ramp for entrance;Direct supervision/assist for financial management;Direct supervision/assist for medications management   Equipment Recommendations  Other (comment) (defer to next venue of care)       Precautions / Restrictions Precautions Precautions: Fall Restrictions Weight Bearing Restrictions: No       Mobility Bed Mobility Overal bed mobility: Needs Assistance Bed Mobility: Supine to Sit     Supine to sit: Mod assist     General bed mobility comments: mod A for trunk support and L LE assistance to EOB    Transfers Overall transfer level: Needs assistance Equipment used: 1 person hand held assist Transfers: Sit to/from Stand, Bed to chair/wheelchair/BSC Sit to Stand: Mod assist     Step pivot transfers: Mod assist, Max assist     General transfer comment: Pt forward facing therapist with assistance for weight shift and advancement of L LE with mod multimodal cuing to sequence steps to recliner chair.     Balance Overall balance assessment: Needs assistance Sitting-balance support: Feet supported, Single extremity supported Sitting balance-Leahy Scale: Fair     Standing balance support: During functional activity, Single extremity supported Standing balance-Leahy Scale: Zero                             ADL either performed or assessed with clinical judgement     Vision Patient Visual Report: No change from baseline            Cognition Arousal/Alertness: Awake/alert Behavior During Therapy: Impulsive, Flat affect, Restless Overall Cognitive Status: Impaired/Different from baseline Area of Impairment: Orientation, Attention, Following commands, Safety/judgement, Awareness, Problem solving, Rancho level  Rancho Levels of Cognitive Functioning Rancho Los Amigos Scales of Cognitive Functioning: Confused, Inappropriate Non-Agitated Orientation Level: Disoriented to, Place, Time, Situation Current Attention  Level: Focused   Following Commands: Follows one step commands inconsistently Safety/Judgement: Decreased awareness of safety, Decreased awareness of deficits Awareness: Intellectual Problem Solving: Slow processing, Difficulty sequencing, Requires verbal cues, Requires tactile cues General Comments: decreased insight to deficits. Pt demanding to get into the recliner chair and then demanding therapist allow him to walk around the room. Rancho Mirant Scales of Cognitive Functioning: Confused, Inappropriate Non-Agitated                 Pertinent Vitals/ Pain       Pain Assessment Pain Assessment: Faces Faces Pain Scale: No hurt         Frequency  Min 2X/week        Progress Toward Goals  OT Goals(current goals can now be found in the care plan section)  Progress towards OT goals: Progressing toward goals     Plan Discharge plan remains appropriate;Frequency remains appropriate       AM-PAC OT "6 Clicks" Daily Activity     Outcome Measure   Help from another person eating meals?: A Little Help from another person taking care of personal grooming?: A Little Help from another person toileting, which includes using toliet, bedpan, or urinal?: Total Help from another person bathing (including washing, rinsing, drying)?: A Lot Help from another person to put on and taking off regular upper body clothing?: A Lot Help from another person to put on and taking off regular lower body clothing?: Total 6 Click Score: 12    End of Session    OT Visit Diagnosis: Unsteadiness on feet (R26.81);Muscle weakness (generalized) (M62.81);Hemiplegia and hemiparesis Hemiplegia - Right/Left: Left Hemiplegia - dominant/non-dominant: Non-Dominant   Activity Tolerance Patient tolerated treatment well;Patient limited by fatigue   Patient Left with nursing/sitter in room;in chair;with call bell/phone within reach;Other (comment) (chair alarm belt donned)   Nurse Communication Mobility  status        Time: 1610-9604 OT Time Calculation (min): 25 min  Charges: OT General Charges $OT Visit: 1 Visit OT Treatments $Therapeutic Activity: 23-37 mins  Jackquline Denmark, MS, OTR/L , CBIS ascom [phone]  06/15/22, 1:05 PM

## 2022-06-15 NOTE — Progress Notes (Signed)
PT Cancellation Note  Patient Details Name: Patrick Perez MRN: [id number] DOB: [birth year]   Cancelled Treatment:    Reason Eval/Treat Not Completed: Fatigue/lethargy limiting ability to participate Pt apparently very eager to try walking earlier today.  OT needed to heavily assist with transfer to recliner.  Attempted to see this afternoon but apparently he became more anxious earlier - ultimately got an Ativan and he is now sleeping soundly in bed - sitter in room.  Discussed with nurse who agrees with holding PT today.    Malachi Pro, DPT 06/15/2022, 3:09 PM

## 2022-06-15 NOTE — Progress Notes (Signed)
[facility]  - [place] [facility]   PATIENT NAME: Patrick Perez    MR#:  [id number]  DATE OF BIRTH:  06-16-1965  SUBJECTIVE:  no family at bedside.  Sitting out of in the chair. Work with PT earlier. Sitter at bedside. Patient feeding himself. Speech still garbled did understand some things he said. He is oriented to place in person. VITALS:  Blood pressure (!) 125/92, pulse (!) 110, temperature 97.7 F (36.5 C), temperature source Oral, resp. rate 16, height 5\' 7"  (1.702 m), weight 80 kg, SpO2 97 %.  PHYSICAL EXAMINATION:   GENERAL:  57 y.o.-year-old patient with no acute distress.  LUNGS: Normal breath sounds bilaterally, no wheezing CARDIOVASCULAR: S1, S2 normal. No murmur   ABDOMEN: Soft, nontender, nondistended. Bowel sounds present.  EXTREMITIES: No  edema b/l.    NEUROLOGIC: nonfocal  patient is alert and awake, restless, garbled speech with left facial droop. Left upper and lower extremity hemiparesis. SKIN: No obvious rash, lesion, or ulcer.   LABORATORY PANEL:  CBC Recent Labs  Lab 06/13/22 0421  WBC 6.2  HGB 14.1  HCT 39.9  PLT 208     Chemistries  Recent Labs  Lab 06/12/22 0420 06/13/22 0421  NA 134* 136  K 3.7 3.8  CL 104 105  CO2 24 23  GLUCOSE 120* 133*  BUN <5* 7  CREATININE 0.71 0.65  CALCIUM 8.7* 8.9  MG 1.6*  --   AST 15  --   ALT 15  --   ALKPHOS 81  --   BILITOT 0.8  --     Cardiac Enzymes No results for input(s): "TROPONINI" in the last 168 hours. RADIOLOGY:  No results found.  Assessment and Plan 57 year old male with a past medical history significant for prior CVA (right thalamic infarct 06/01/2021 and right basal ganglia hemorrhage 06/08/2021) with left-sided hemiparesis, dysphagia, rheumatoid arthritis, type 2 diabetes mellitus who presents to [facility] ED on 06/08/2022 due to intentional drug overdose. Patient is currently intubated and sedated, therefore history is obtained from patient's significant other at  bedside along with chart review.  Patient's significant other at bedside reports that the patient called her and his mother today telling them that he had taken too many medications intentionally.   4/29 Echocardiogram showed a normal ejection fraction.  EEG did not have evidence of a left echogenicity arising from the right frontal central region secondary to encephalomalacia.  Neurology does not want to start any antiseizure medications.  Speech therapy put on a dysphagia 1 diet.  Will get physical therapy and Occupational Therapy consultations.   4/30.  CT angiogram shows occlusion of the right vertebral artery.  Patient worked with physical therapy.  Patient did well with a swallow evaluation today and upgraded on diet.  5/1: patient quite agitated and restless. Wants to leave. Not able to do much with therapy he is a 2 person assist. Calling out for his girlfriend. Discussed with psychiatry nurse practitioner regarding his agitation. Added PRN Ativan and Haldol. Started on Seroquel by psych -- psych recommending inpatient behavioral medicine admission once medically stable   Acute stroke due to ischemia [facility]) --MRI of the brain shows a 7 mm focus of diffusion abnormality involving the right paramedian midbrain likely reflecting evolving subacute small vessel infarct. --  Neurology recommended Plavix.   --LDL 86.  Cont Lipitor.   --Placed on a diet --tolerating ok --PT and OT evaluation noted. Patient is maximum to person assist he will benefit from rehab.  Vertebral artery occlusion, right --Continue Plavix and Lipitor   Drug overdose, intentional (HCC) --Continued psych follow-up.  Overdose on amitriptyline and Flexeril.  Extubated on 4/24.  -- Psychiatry nurse practitioner recommends Seroquel which has been started. They also recommend inpatient behavioral medicine transfer once medically clear`. D/w Thurston Hole --5/2--per Psych given the amount of assist he needs  they are  recommends d/c to Med-psych facility--TOC informed. Looks like it will be challenging to place this pt  Acute metabolic encephalopathy --Likely secondary to drug overdose.  EEG reviewed and case discussed with neurology.  No need for seizure medication at this point.   Hyponatremia Sodium now normal range   Hypomagnesemia Replaced   Rheumatoid arthritis (HCC) --  restart folic acid,gabapentin and tramadol.     Family communication :none today Consults : neurology, psychiatry CODE STATUS: full DVT Prophylaxis : enoxaparin Level of care: Med-Surg Status is: Inpatient Remains inpatient appropriate because: acute CVA, intentional drug overdose. Will monitor for another day or two and if remains stable transfer to inpatient behavioral medicine    TOTAL TIME TAKING CARE OF THIS PATIENT: 35 minutes.  >50% time spent on counselling and coordination of care  Note: This dictation was prepared with Dragon dictation along with smaller phrase technology. Any transcriptional errors that result from this process are unintentional.  Enedina Finner M.D    [facility]   CC: Primary care physician; [name], MD

## 2022-06-16 DIAGNOSIS — F4325 Adjustment disorder with mixed disturbance of emotions and conduct: Secondary | ICD-10-CM | POA: Diagnosis present

## 2022-06-16 DIAGNOSIS — I639 Cerebral infarction, unspecified: Secondary | ICD-10-CM | POA: Diagnosis not present

## 2022-06-16 LAB — GLUCOSE, CAPILLARY
Glucose-Capillary: 111 mg/dL — ABNORMAL HIGH (ref 70–99)
Glucose-Capillary: 120 mg/dL — ABNORMAL HIGH (ref 70–99)
Glucose-Capillary: 133 mg/dL — ABNORMAL HIGH (ref 70–99)
Glucose-Capillary: 136 mg/dL — ABNORMAL HIGH (ref 70–99)
Glucose-Capillary: 142 mg/dL — ABNORMAL HIGH (ref 70–99)
Glucose-Capillary: 151 mg/dL — ABNORMAL HIGH (ref 70–99)

## 2022-06-16 NOTE — Consult Note (Signed)
[facility] Face-to-Face Psychiatry Consult   Reason for Consult:  Suicide attempt / Drug overdose Referring Physician:  Harlon Ditty, NP Patient Identification: Patrick Perez MRN:  [id number] Principal Diagnosis: Acute stroke due to ischemia [facility]) Diagnosis:  Principal Problem:   Acute stroke due to ischemia [facility]) Active Problems:   Adjustment disorder with mixed disturbance of emotions and conduct   Rheumatoid arthritis (HCC)   Hyponatremia   Drug overdose, intentional (HCC)   Acute metabolic encephalopathy   Hypomagnesemia   Vertebral artery occlusion, right   Total Time spent with patient: 45 minutes  Subjective:   Patrick Perez is a 57 y.o. male patient admitted with intentional drug overdose after being angry and frustrated.  HPI:  Patrick Perez is a 57 y.o. male with a history of major depressive disorder, CVA presented to the emergency department unresponsive on 04/25 via EMS due to intentional drug overdose.  The patient is currently on the medical floor.    The client continues to deny suicidal ideations and regrets his overdose.  "I need to make different decisions".  When he took the pills he was angry at the time.  He reported, "It's good," in reference to his mood.  Smiling appropriately. Low level of anxiety, no psychosis. The sitter assisted him to the bedside commode and he is agreeable to go to physical rehab.  He is future oriented and recommend physical rehab, no psych admission needed.  Consulted with Dr Toni Amend on this decision, concurs with the decision for physical rehab being most beneficial and no need for inpatient psych hospitalization.  Past Psychiatric History: Anxiety, depression    Past Medical History:  Past Medical History:  Diagnosis Date   Anxiety    Arthritis    rheumatoid   Depression    Diabetes mellitus without complication (HCC)    Stroke (HCC)     Past Surgical History:  Procedure Laterality Date   TIBIA FRACTURE SURGERY Left     TONSILLECTOMY     Family History:  Family History  Problem Relation Age of Onset   Diabetes Mother    Cancer Father        prostate   Cancer Paternal Grandfather        colon   Family Psychiatric  History: No pertinent family psychiatric history on file. Social History:  Social History   Substance and Sexual Activity  Alcohol Use Yes   Alcohol/week: 2.0 standard drinks of alcohol   Types: 2 Cans of beer per week     Social History   Substance and Sexual Activity  Drug Use Yes   Frequency: 7.0 times per week   Types: Marijuana   Comment: smokes once a day     Social History   Socioeconomic History   Marital status: Divorced    Spouse name: Not on file   Number of children: 3   Years of education: Not on file   Highest education level: GED or equivalent  Occupational History   Occupation: Disabled  Tobacco Use   Smoking status: Former    Packs/day: 0.50    Years: 27.00    Additional pack years: 0.00    Total pack years: 13.50    Types: Cigarettes    Quit date: 04/27/2020    Years since quitting: 2.1   Smokeless tobacco: Never   Tobacco comments:    using nicorett gum  Vaping Use   Vaping Use: Never used  Substance and Sexual Activity   Alcohol use: Yes  Alcohol/week: 2.0 standard drinks of alcohol    Types: 2 Cans of beer per week   Drug use: Yes    Frequency: 7.0 times per week    Types: Marijuana    Comment: smokes once a day    Sexual activity: Yes  Other Topics Concern   Not on file  Social History Narrative   Not on file   Social Determinants of Health   Financial Resource Strain: Low Risk  (08/29/2017)   Overall Financial Resource Strain (CARDIA)    Difficulty of Paying Living Expenses: Not hard at all  Food Insecurity: No Food Insecurity (08/29/2017)   Hunger Vital Sign    Worried About Running Out of Food in the Last Year: Never true    Ran Out of Food in the Last Year: Never true  Transportation Needs: No Transportation Needs  (08/29/2017)   PRAPARE - Administrator, Civil Service (Medical): No    Lack of Transportation (Non-Medical): No  Physical Activity: Inactive (08/29/2017)   Exercise Vital Sign    Days of Exercise per Week: 0 days    Minutes of Exercise per Session: 0 min  Stress: No Stress Concern Present (08/29/2017)   [employer] of Occupational Health - Occupational Stress Questionnaire    Feeling of Stress : Only a little  Social Connections: Unknown (08/29/2017)   Social Connection and Isolation Panel [NHANES]    Frequency of Communication with Friends and Family: Patient declined    Frequency of Social Gatherings with Friends and Family: Patient declined    Attends Religious Services: Patient declined    Database administrator or Organizations: Patient declined    Attends Banker Meetings: Patient declined    Marital Status: Divorced   Additional Social History:    Allergies:   Allergies  Allergen Reactions   Oxycodone Hives   Oxycodone-Acetaminophen Anxiety   Quetiapine Rash    Labs:  Results for orders placed or performed during the hospital encounter of 06/08/22 (from the past 48 hour(s))  Glucose, capillary     Status: Abnormal   Collection Time: 06/14/22  3:58 PM  Result Value Ref Range   Glucose-Capillary 131 (H) 70 - 99 mg/dL    Comment: Glucose reference range applies only to samples taken after fasting for at least 8 hours.  Glucose, capillary     Status: Abnormal   Collection Time: 06/14/22  8:08 PM  Result Value Ref Range   Glucose-Capillary 122 (H) 70 - 99 mg/dL    Comment: Glucose reference range applies only to samples taken after fasting for at least 8 hours.  Glucose, capillary     Status: Abnormal   Collection Time: 06/14/22 11:23 PM  Result Value Ref Range   Glucose-Capillary 109 (H) 70 - 99 mg/dL    Comment: Glucose reference range applies only to samples taken after fasting for at least 8 hours.  Glucose, capillary     Status:  Abnormal   Collection Time: 06/15/22  3:48 AM  Result Value Ref Range   Glucose-Capillary 116 (H) 70 - 99 mg/dL    Comment: Glucose reference range applies only to samples taken after fasting for at least 8 hours.  Glucose, capillary     Status: Abnormal   Collection Time: 06/15/22  8:05 AM  Result Value Ref Range   Glucose-Capillary 133 (H) 70 - 99 mg/dL    Comment: Glucose reference range applies only to samples taken after fasting for at least 8 hours.  Glucose, capillary     Status: Abnormal   Collection Time: 06/15/22 11:31 AM  Result Value Ref Range   Glucose-Capillary 142 (H) 70 - 99 mg/dL    Comment: Glucose reference range applies only to samples taken after fasting for at least 8 hours.  Glucose, capillary     Status: Abnormal   Collection Time: 06/15/22  4:24 PM  Result Value Ref Range   Glucose-Capillary 126 (H) 70 - 99 mg/dL    Comment: Glucose reference range applies only to samples taken after fasting for at least 8 hours.  Glucose, capillary     Status: Abnormal   Collection Time: 06/15/22  9:50 PM  Result Value Ref Range   Glucose-Capillary 137 (H) 70 - 99 mg/dL    Comment: Glucose reference range applies only to samples taken after fasting for at least 8 hours.   Comment 1 Notify RN   Glucose, capillary     Status: Abnormal   Collection Time: 06/16/22 12:02 AM  Result Value Ref Range   Glucose-Capillary 142 (H) 70 - 99 mg/dL    Comment: Glucose reference range applies only to samples taken after fasting for at least 8 hours.  Glucose, capillary     Status: Abnormal   Collection Time: 06/16/22  4:14 AM  Result Value Ref Range   Glucose-Capillary 111 (H) 70 - 99 mg/dL    Comment: Glucose reference range applies only to samples taken after fasting for at least 8 hours.   Comment 1 Notify RN   Glucose, capillary     Status: Abnormal   Collection Time: 06/16/22  7:36 AM  Result Value Ref Range   Glucose-Capillary 151 (H) 70 - 99 mg/dL    Comment: Glucose  reference range applies only to samples taken after fasting for at least 8 hours.  Glucose, capillary     Status: Abnormal   Collection Time: 06/16/22 11:45 AM  Result Value Ref Range   Glucose-Capillary 120 (H) 70 - 99 mg/dL    Comment: Glucose reference range applies only to samples taken after fasting for at least 8 hours.    Current Facility-Administered Medications  Medication Dose Route Frequency Provider Last Rate Last Admin   acetaminophen (TYLENOL) tablet 650 mg  650 mg Oral Q6H PRN Alford Highland, MD   650 mg at 06/16/22 1035   atorvastatin (LIPITOR) tablet 40 mg  40 mg Oral Daily Alford Highland, MD   40 mg at 06/16/22 1028   clopidogrel (PLAVIX) tablet 75 mg  75 mg Oral Daily Jefferson Fuel, MD   75 mg at 06/16/22 1028   diphenhydrAMINE (BENADRYL) capsule 50 mg  50 mg Oral TID PRN Thurston Hole H, NP   50 mg at 06/15/22 2355   Or   diphenhydrAMINE (BENADRYL) injection 50 mg  50 mg Intravenous TID PRN Bennett, Christal H, NP       docusate (COLACE) 50 MG/5ML liquid 100 mg  100 mg Per Tube BID PRN Erin Fulling, MD       docusate sodium (COLACE) capsule 100 mg  100 mg Oral BID Lowella Bandy, RPH   100 mg at 06/16/22 1028   enoxaparin (LOVENOX) injection 40 mg  40 mg Subcutaneous Q24H Harlon Ditty D, NP   40 mg at 06/15/22 2216   folic acid (FOLVITE) tablet 1 mg  1 mg Oral Daily Alford Highland, MD   1 mg at 06/16/22 1028   gabapentin (NEURONTIN) capsule 300 mg  300 mg Oral TID Alford Highland,  MD   300 mg at 06/16/22 1028   haloperidol (HALDOL) tablet 5 mg  5 mg Oral TID PRN Bennett, Christal H, NP       Or   haloperidol lactate (HALDOL) injection 5 mg  5 mg Intravenous TID PRN Bennett, Christal H, NP       LORazepam (ATIVAN) tablet 2 mg  2 mg Oral TID PRN Willeen Cass, Christal H, NP   2 mg at 06/16/22 1247   Or   LORazepam (ATIVAN) injection 2 mg  2 mg Intravenous TID PRN Bennett, Christal H, NP   2 mg at 06/15/22 1254   melatonin tablet 5 mg  5 mg Oral QHS Bennett,  Christal H, NP   5 mg at 06/15/22 2216   Oral care mouth rinse  15 mL Mouth Rinse PRN Earley Brooke, MD       pantoprazole (PROTONIX) EC tablet 40 mg  40 mg Oral QHS Lowella Bandy, RPH   40 mg at 06/15/22 2216   polyethylene glycol (MIRALAX / GLYCOLAX) packet 17 g  17 g Per Tube Daily PRN Harlon Ditty D, NP   17 g at 06/15/22 0834   polyethylene glycol (MIRALAX / GLYCOLAX) packet 17 g  17 g Oral Daily Lowella Bandy, RPH   17 g at 06/16/22 1029   sertraline (ZOLOFT) tablet 25 mg  25 mg Oral Daily Bennett, Christal H, NP   25 mg at 06/16/22 1028   [START ON 06/21/2022] sertraline (ZOLOFT) tablet 50 mg  50 mg Oral Daily Bennett, Christal H, NP       traMADol (ULTRAM) tablet 50 mg  50 mg Oral Q12H PRN Alford Highland, MD   50 mg at 06/15/22 2355    Musculoskeletal: Strength & Muscle Tone: decreased and Chart review reveals left-sided hemiparesis  Gait & Station:  Did not assess  Patient leans: N/A  Psychiatric Specialty Exam: Physical Exam Vitals and nursing note reviewed.  HENT:     Head: Normocephalic.     Nose: Nose normal.  Pulmonary:     Effort: Pulmonary effort is normal.  Musculoskeletal:     Cervical back: Normal range of motion.  Neurological:     Mental Status: He is alert.     Motor: Weakness present.     Comments: Chart review reveals left-sided hemiparesis   Psychiatric:        Attention and Perception: Attention and perception normal. He does not perceive auditory or visual hallucinations.        Mood and Affect: Mood is anxious and depressed.        Speech: Speech is slurred.        Behavior: Behavior is cooperative.        Thought Content: Thought content normal. Thought content is not paranoid. Thought content does not include homicidal or suicidal ideation.        Cognition and Memory: Cognition and memory normal.        Judgment: Judgment normal.     Review of Systems  Psychiatric/Behavioral:  Positive for depression. The patient is nervous/anxious.   All  other systems reviewed and are negative.   Blood pressure 101/86, pulse 100, temperature 97.6 F (36.4 C), temperature source Oral, resp. rate 18, height 5\' 7"  (1.702 m), weight 74.5 kg, SpO2 94 %.Body mass index is 25.73 kg/m.  General Appearance: Casual  Eye Contact:  Good  Speech:  Garbled  Volume:  Normal  Mood:  "Good"  Affect:  Congruent  Thought Process:  Coherent  Orientation:  Full (Time, Place, and Person)  Thought Content:  Logical  Suicidal Thoughts:  No  Homicidal Thoughts:  No  Memory:  Immediate;   Good Recent;   Good Remote;   Good  Judgement:  Fair  Insight:  Fair  Psychomotor Activity:  Decreased  Concentration:  Concentration: Good and Attention Span: Good  Recall:  Fair  Fund of Knowledge:  Good  Language:  Fair  Akathisia:  No  Handed:  Right  AIMS (if indicated):     Assets:  Leisure Time Resilience Social Support  ADL's:  Impaired  Cognition:  WNL  Sleep:        Physical Exam: Physical Exam Vitals and nursing note reviewed.  HENT:     Head: Normocephalic.     Nose: Nose normal.  Pulmonary:     Effort: Pulmonary effort is normal.  Musculoskeletal:     Cervical back: Normal range of motion.  Neurological:     Mental Status: He is alert.     Motor: Weakness present.     Comments: Chart review reveals left-sided hemiparesis   Psychiatric:        Attention and Perception: Attention and perception normal. He does not perceive auditory or visual hallucinations.        Mood and Affect: Mood is anxious and depressed.        Speech: Speech is slurred.        Behavior: Behavior is cooperative.        Thought Content: Thought content normal. Thought content is not paranoid. Thought content does not include homicidal or suicidal ideation.        Cognition and Memory: Cognition and memory normal.        Judgment: Judgment normal.    Review of Systems  Psychiatric/Behavioral:  Positive for depression. The patient is nervous/anxious.   All other  systems reviewed and are negative.  Blood pressure 101/86, pulse 100, temperature 97.6 F (36.4 C), temperature source Oral, resp. rate 18, height 5\' 7"  (1.702 m), weight 74.5 kg, SpO2 94 %. Body mass index is 25.73 kg/m.  Treatment Plan Summary: Adjustment disorder with mixed disturbance of emotions and conduct Sertraline 50 mg daily.  Insomnia Melatonin 5 mg daily at bedtime to aid in sleep support.   Rsk/agitation protocol: Ativan 2 mg PO or IV TID PRN Haloperidol 5 mg PO or IV TID PRN  Diphenhydramine 50 mg PO or IV TID PRN    Disposition:  Physical rehab  Nanine Means, NP 06/16/2022 3:51 PM

## 2022-06-16 NOTE — Plan of Care (Signed)

## 2022-06-16 NOTE — Progress Notes (Signed)
[facility]  - [place] at [facility]   PATIENT NAME: Patrick Perez    MR#:  [id number]  DATE OF BIRTH:  1965/06/20  SUBJECTIVE:  no family at bedside.   Work with PT earlier. Patient ambulated 80 feet today with rolling walker with PT sitter at bedside. Speech is appearing more clear today. He is more awake and alert. VITALS:  Blood pressure 101/86, pulse 100, temperature 97.6 F (36.4 C), temperature source Oral, resp. rate 18, height 5\' 7"  (1.702 m), weight 74.5 kg, SpO2 94 %.  PHYSICAL EXAMINATION:   GENERAL:  57 y.o.-year-old patient with no acute distress.  LUNGS: Normal breath sounds bilaterally, no wheezing CARDIOVASCULAR: S1, S2 normal. No murmur   ABDOMEN: Soft, nontender, nondistended. Bowel sounds present.  EXTREMITIES: No  edema b/l.    NEUROLOGIC: nonfocal  patient is alert and awake, restless, garbled speech with left facial droop. Left upper and lower extremity hemiparesis. SKIN: No obvious rash, lesion, or ulcer.   LABORATORY PANEL:  CBC Recent Labs  Lab 06/13/22 0421  WBC 6.2  HGB 14.1  HCT 39.9  PLT 208     Chemistries  Recent Labs  Lab 06/12/22 0420 06/13/22 0421  NA 134* 136  K 3.7 3.8  CL 104 105  CO2 24 23  GLUCOSE 120* 133*  BUN <5* 7  CREATININE 0.71 0.65  CALCIUM 8.7* 8.9  MG 1.6*  --   AST 15  --   ALT 15  --   ALKPHOS 81  --   BILITOT 0.8  --     Cardiac Enzymes No results for input(s): "TROPONINI" in the last 168 hours. RADIOLOGY:  No results found.  Assessment and Plan 57 year old male with a past medical history significant for prior CVA (right thalamic infarct 06/01/2021 and right basal ganglia hemorrhage 06/08/2021) with left-sided hemiparesis, dysphagia, rheumatoid arthritis, type 2 diabetes mellitus who presents to [facility] ED on 06/08/2022 due to intentional drug overdose. Patient is currently intubated and sedated, therefore history is obtained from patient's significant other at bedside along with chart  review.  Patient's significant other at bedside reports that the patient called her and his mother today telling them that he had taken too many medications intentionally.   4/29 Echocardiogram showed a normal ejection fraction.  EEG did not have evidence of a left echogenicity arising from the right frontal central region secondary to encephalomalacia.  Neurology does not want to start any antiseizure medications.  Speech therapy put on a dysphagia 1 diet.  Will get physical therapy and Occupational Therapy consultations.   4/30.  CT angiogram shows occlusion of the right vertebral artery.  Patient worked with physical therapy.  Patient did well with a swallow evaluation today and upgraded on diet.  5/1: patient quite agitated and restless. Wants to leave. Not able to do much with therapy he is a 2 person assist. Calling out for his girlfriend. Discussed with psychiatry nurse practitioner regarding his agitation. Added PRN Ativan and Haldol. Started on Seroquel by psych -- psych recommending inpatient behavioral medicine admission once medically stable   Acute stroke due to ischemia [facility]) --MRI of the brain shows a 7 mm focus of diffusion abnormality involving the right paramedian midbrain likely reflecting evolving subacute small vessel infarct. --  Neurology recommended Plavix.   --LDL 86.  Cont Lipitor.   --Placed on a diet --tolerating ok --PT and OT evaluation noted. Patient is maximum to person assist he will benefit from rehab. -- Patient ambulated about 80  feet today   Vertebral artery occlusion, right --Continue Plavix and Lipitor   Drug overdose, intentional (HCC) --Continued psych follow-up.  Overdose on amitriptyline and Flexeril.  Extubated on 4/24.  -- Psychiatry nurse practitioner recommends Seroquel which has been started. They also recommend inpatient behavioral medicine transfer once medically clear`. D/w [facility] Hole --5/2--per Psych given the amount of assist he  needs  they are recommends d/c to Med-psych facility--TOC informed. Looks like it will be challenging to place this pt   Acute metabolic encephalopathy --Likely secondary to drug overdose.  EEG reviewed and case discussed with neurology.  No need for seizure medication at this point.   Hyponatremia Sodium now normal range   Hypomagnesemia Replaced   Rheumatoid arthritis (HCC) --restart folic acid,gabapentin and tramadol.     Family communication :none today Consults : neurology, psychiatry CODE STATUS: full DVT Prophylaxis : enoxaparin Level of care: Med-Surg Status is: Inpatient Remains inpatient appropriate because: acute CVA, intentional drug overdose.  TOC to work on discharge planning.  TOTAL TIME TAKING CARE OF THIS PATIENT: 35 minutes.  >50% time spent on counselling and coordination of care  Note: This dictation was prepared with Dragon dictation along with smaller phrase technology. Any transcriptional errors that result from this process are unintentional.  Enedina Finner M.D    [facility]   CC: Primary care physician; [name], MD

## 2022-06-16 NOTE — Progress Notes (Signed)
Physical Therapy Treatment Patient Details Name: Patrick Perez MRN: [id number] DOB: 1965/07/12 Today's Date: 06/16/2022   History of Present Illness Pt is a 57 yo male with a past medical history significant for prior CVA (right thalamic infarct 06/01/2021 and right basal ganglia hemorrhage 06/08/2021) with left-sided hemiparesis, dysphagia, rheumatoid arthritis, type 2 diabetes mellitus who presents to [facility] ED on 06/08/2022 due to intentional drug overdose.  MD assessment includes: acute stroke due to ischemia, acute metabolic encephalopathy, hyponatremia, intentional drug overdose, and hypomagnesemia.    PT Comments    Pt was pleasant and motivated to participate during the session and put forth good effort throughout. Pt required only min assist and cuing during bed mobility, transfer, and gait training.  Pt was able to amb 80 feet with a RW with +2 min A and chair follow for safety but ultimately was fairly steady.  Pt's biggest challenge with ambulation was related to poor attention to LE positioning within the RW with pt frequently making contact with the walker leg with his L foot while advancing his LLE despite max attempts to get pt to correct without assist.  Pt remains at a very high risk for falls and will benefit from continued PT services upon discharge to safely address deficits listed in patient problem list for decreased caregiver assistance and eventual return to PLOF.     Recommendations for follow up therapy are one component of a multi-disciplinary discharge planning process, led by the attending physician.  Recommendations may be updated based on patient status, additional functional criteria and insurance authorization.  Follow Up Recommendations  Can patient physically be transported by private vehicle: No    Assistance Recommended at Discharge Frequent or constant Supervision/Assistance  Patient can return home with the following A lot of help with walking and/or transfers;A  lot of help with bathing/dressing/bathroom;Assistance with cooking/housework;Direct supervision/assist for medications management;Direct supervision/assist for financial management;Assist for transportation;Help with stairs or ramp for entrance   Equipment Recommendations  Other (comment) (TBD at next venue of care)    Recommendations for Other Services       Precautions / Restrictions Precautions Precautions: Fall Restrictions Weight Bearing Restrictions: No     Mobility  Bed Mobility Overal bed mobility: Needs Assistance Bed Mobility: Rolling, Sidelying to Sit Rolling: Min assist Sidelying to sit: Min assist       General bed mobility comments: Mod multi-modal cues for sequencing during log roll training    Transfers Overall transfer level: Needs assistance Equipment used: Rolling walker (2 wheels) Transfers: Sit to/from Stand Sit to Stand: +2 physical assistance, Min assist           General transfer comment: Mod multi-modal cues for proper sequencing including hand placement and increased trunk flexion    Ambulation/Gait Ambulation/Gait assistance: Min assist, +2 safety/equipment Gait Distance (Feet): 80 Feet Assistive device: Rolling walker (2 wheels) Gait Pattern/deviations: Step-to pattern, Decreased step length - right, Decreased stance time - left Gait velocity: decreased     General Gait Details: Min A for stability and to guide the RW with heavy multi-modal cuing for proper sequencing with the RW;  pt frequently made contact between his L foot and the walker leg when advancing his LLE despite max cuing, pt unable to attend to goals/instructions given during gait training; pt able to keep L hand on walker with occasional min A and cuing for increased grip   Stairs             Wheelchair Mobility  Modified Rankin (Stroke Patients Only)       Balance Overall balance assessment: Needs assistance Sitting-balance support: Feet supported,  Single extremity supported Sitting balance-Leahy Scale: Good     Standing balance support: During functional activity, Bilateral upper extremity supported, Reliant on assistive device for balance Standing balance-Leahy Scale: Fair                              Cognition Arousal/Alertness: Awake/alert Behavior During Therapy: Impulsive Overall Cognitive Status: No family/caregiver present to determine baseline cognitive functioning                                          Exercises      General Comments        Pertinent Vitals/Pain Pain Assessment Pain Assessment: 0-10 Pain Score: 9  Pain Location: R shoulder Pain Descriptors / Indicators: Sore, Aching Pain Intervention(s): Monitored during session, Repositioned, RN gave pain meds during session    Home Living                          Prior Function            PT Goals (current goals can now be found in the care plan section) Progress towards PT goals: Progressing toward goals    Frequency    Min 3X/week      PT Plan Current plan remains appropriate    Co-evaluation PT/OT/SLP Co-Evaluation/Treatment: Yes Reason for Co-Treatment: Complexity of the patient's impairments (multi-system involvement);Necessary to address cognition/behavior during functional activity;For patient/therapist safety;To address functional/ADL transfers PT goals addressed during session: Mobility/safety with mobility;Balance;Proper use of DME        AM-PAC PT "6 Clicks" Mobility   Outcome Measure  Help needed turning from your back to your side while in a flat bed without using bedrails?: A Little Help needed moving from lying on your back to sitting on the side of a flat bed without using bedrails?: A Little Help needed moving to and from a bed to a chair (including a wheelchair)?: A Little Help needed standing up from a chair using your arms (e.g., wheelchair or bedside chair)?: A Little Help  needed to walk in hospital room?: A Little Help needed climbing 3-5 steps with a railing? : A Lot 6 Click Score: 17    End of Session Equipment Utilized During Treatment: Gait belt Activity Tolerance: Patient tolerated treatment well Patient left: in chair;with nursing/sitter in room;Other (comment) (pt left with OT and CNA at end of session) Nurse Communication: Mobility status PT Visit Diagnosis: Unsteadiness on feet (R26.81);Muscle weakness (generalized) (M62.81);Other abnormalities of gait and mobility (R26.89);Difficulty in walking, not elsewhere classified (R26.2);Hemiplegia and hemiparesis Hemiplegia - Right/Left: Left Hemiplegia - dominant/non-dominant: Non-dominant Hemiplegia - caused by: Cerebral infarction     Time: 9604-5409 PT Time Calculation (min) (ACUTE ONLY): 26 min  Charges:  $Gait Training: 8-22 mins                     D. Scott Upton Russey PT, DPT 06/16/22, 12:08 PM

## 2022-06-16 NOTE — Plan of Care (Signed)
  Problem: Education: Goal: Ability to describe self-care measures that may prevent or decrease complications (Diabetes Survival Skills Education) will improve Outcome: Progressing Goal: Individualized Educational Video(s) Outcome: Progressing   Problem: Coping: Goal: Ability to adjust to condition or change in health will improve Outcome: Progressing   Problem: Fluid Volume: Goal: Ability to maintain a balanced intake and output will improve Outcome: Progressing   Problem: Health Behavior/Discharge Planning: Goal: Ability to identify and utilize available resources and services will improve Outcome: Progressing Goal: Ability to manage health-related needs will improve Outcome: Progressing   Problem: Metabolic: Goal: Ability to maintain appropriate glucose levels will improve Outcome: Progressing   Problem: Nutritional: Goal: Maintenance of adequate nutrition will improve Outcome: Progressing Goal: Progress toward achieving an optimal weight will improve Outcome: Progressing   Problem: Skin Integrity: Goal: Risk for impaired skin integrity will decrease Outcome: Progressing   Problem: Tissue Perfusion: Goal: Adequacy of tissue perfusion will improve Outcome: Progressing   Problem: Education: Goal: Knowledge of General Education information will improve Description: Including pain rating scale, medication(s)/side effects and non-pharmacologic comfort measures Outcome: Progressing   Problem: Health Behavior/Discharge Planning: Goal: Ability to manage health-related needs will improve 06/16/2022 0324 by Leonie Douglas, RN Outcome: Progressing 06/16/2022 0011 by Leonie Douglas, RN Outcome: Progressing   Problem: Clinical Measurements: Goal: Ability to maintain clinical measurements within normal limits will improve Outcome: Progressing Goal: Will remain free from infection Outcome: Progressing Goal: Diagnostic test results will improve Outcome:  Progressing Goal: Respiratory complications will improve Outcome: Progressing Goal: Cardiovascular complication will be avoided Outcome: Progressing   Problem: Activity: Goal: Risk for activity intolerance will decrease Outcome: Progressing   Problem: Nutrition: Goal: Adequate nutrition will be maintained 06/16/2022 0324 by Leonie Douglas, RN Outcome: Progressing 06/16/2022 0011 by Leonie Douglas, RN Outcome: Progressing   Problem: Coping: Goal: Level of anxiety will decrease 06/16/2022 0324 by Leonie Douglas, RN Outcome: Progressing 06/16/2022 0011 by Leonie Douglas, RN Outcome: Progressing   Problem: Elimination: Goal: Will not experience complications related to bowel motility Outcome: Progressing Goal: Will not experience complications related to urinary retention Outcome: Progressing   Problem: Pain Managment: Goal: General experience of comfort will improve Outcome: Progressing   Problem: Safety: Goal: Ability to remain free from injury will improve Outcome: Progressing   Problem: Skin Integrity: Goal: Risk for impaired skin integrity will decrease Outcome: Progressing   Problem: Health Behavior/Discharge Planning: Goal: Ability to manage health-related needs will improve Outcome: Progressing   Problem: Clinical Measurements: Goal: Will remain free from infection Outcome: Progressing   Problem: Nutrition: Goal: Adequate nutrition will be maintained Outcome: Progressing   Problem: Coping: Goal: Level of anxiety will decrease Outcome: Progressing

## 2022-06-16 NOTE — Progress Notes (Signed)
SLP Cancellation Note  Patient Details Name: Posey Eddinger MRN: [id number] DOB: 09/16/1965   Cancelled treatment:       Reason Eval/Treat Not Completed: Fatigue/lethargy limiting ability to participate  Pt sleeping soundly, unable to arouse for full participation- had several therapies in AM.   Shell Blanchette 06/16/2022, 2:23 PM

## 2022-06-16 NOTE — Progress Notes (Signed)
Occupational Therapy Treatment Patient Details Name: Patrick Perez MRN: [id number] DOB: May 31, 1965 Today's Date: 06/16/2022   History of present illness Pt is a 57 yo male with a past medical history significant for prior CVA (right thalamic infarct 06/01/2021 and right basal ganglia hemorrhage 06/08/2021) with left-sided hemiparesis, dysphagia, rheumatoid arthritis, type 2 diabetes mellitus who presents to [facility] ED on 06/08/2022 due to intentional drug overdose.  MD assessment includes: acute stroke due to ischemia, acute metabolic encephalopathy, hyponatremia, intentional drug overdose, and hypomagnesemia.   OT comments  Pt seen for OT tx. Co-tx with PT to address ADL mobility. Pt completed bed mobility and ADL transfers with MIN A +2 with RW, PT providing assist for LUE support on RW. Pt negotiated in room mobility with +2 and then out into hallway. Throughout, pt required MAX/near constant VC and TC on RW to redirect for sequencing and safety while ambulating. Pt very talkative and demo's difficulty maintaining center positioning in RW with concern for the RW hitting his L foot with significant risk for falls. Pt demonstrating significant progress from previous sessions. Continue with current POC.    Recommendations for follow up therapy are one component of a multi-disciplinary discharge planning process, led by the attending physician.  Recommendations may be updated based on patient status, additional functional criteria and insurance authorization.    Assistance Recommended at Discharge Frequent or constant Supervision/Assistance  Patient can return home with the following  Two people to help with walking and/or transfers;Two people to help with bathing/dressing/bathroom;Assist for transportation;Assistance with cooking/housework;Help with stairs or ramp for entrance;Direct supervision/assist for financial management;Direct supervision/assist for medications management   Equipment  Recommendations  Other (comment) (defer to next venue)    Recommendations for Other Services      Precautions / Restrictions Precautions Precautions: Fall Restrictions Weight Bearing Restrictions: No       Mobility Bed Mobility Overal bed mobility: Needs Assistance Bed Mobility: Rolling, Sidelying to Sit Rolling: Min assist Sidelying to sit: Min assist       General bed mobility comments: Mod multi-modal cues for sequencing during log roll training    Transfers Overall transfer level: Needs assistance Equipment used: Rolling walker (2 wheels) Transfers: Sit to/from Stand Sit to Stand: +2 physical assistance, Min assist           General transfer comment: Mod multi-modal cues for proper sequencing including hand placement and increased trunk flexion     Balance Overall balance assessment: Needs assistance Sitting-balance support: Feet supported, Single extremity supported Sitting balance-Leahy Scale: Good     Standing balance support: During functional activity, Bilateral upper extremity supported, Reliant on assistive device for balance Standing balance-Leahy Scale: Fair                             ADL either performed or assessed with clinical judgement   ADL Overall ADL's : Needs assistance/impaired                                     Functional mobility during ADLs: +2 for safety/equipment;Cueing for safety;Cueing for sequencing;Rolling walker (2 wheels);Minimal assistance      Extremity/Trunk Assessment              Vision       Perception     Praxis      Cognition Arousal/Alertness: Awake/alert Behavior During Therapy: Impulsive Overall Cognitive  Status: No family/caregiver present to determine baseline cognitive functioning                                 General Comments: Easily distracted requiring near constant cues to redirect and for safety, decreased insights into deficits         Exercises Other Exercises Other Exercises: Pt instructed in RW mgt, sequencing to minimize falls risk    Shoulder Instructions       General Comments      Pertinent Vitals/ Pain       Pain Assessment Pain Assessment: 0-10 Pain Score: 9  Pain Location: R shoulder Pain Descriptors / Indicators: Sore, Aching Pain Intervention(s): Limited activity within patient's tolerance, Repositioned, RN gave pain meds during session  Home Living                                          Prior Functioning/Environment              Frequency  Min 2X/week        Progress Toward Goals  OT Goals(current goals can now be found in the care plan section)        Plan Discharge plan remains appropriate;Frequency remains appropriate    Co-evaluation    PT/OT/SLP Co-Evaluation/Treatment: Yes Reason for Co-Treatment: Complexity of the patient's impairments (multi-system involvement);Necessary to address cognition/behavior during functional activity;For patient/therapist safety;To address functional/ADL transfers PT goals addressed during session: Mobility/safety with mobility;Balance;Proper use of DME OT goals addressed during session: ADL's and self-care      AM-PAC OT "6 Clicks" Daily Activity     Outcome Measure   Help from another person eating meals?: A Little Help from another person taking care of personal grooming?: A Little Help from another person toileting, which includes using toliet, bedpan, or urinal?: A Lot Help from another person bathing (including washing, rinsing, drying)?: A Lot Help from another person to put on and taking off regular upper body clothing?: A Lot Help from another person to put on and taking off regular lower body clothing?: Total 6 Click Score: 13    End of Session Equipment Utilized During Treatment: Gait belt;Rolling walker (2 wheels)  OT Visit Diagnosis: Unsteadiness on feet (R26.81);Muscle weakness (generalized)  (M62.81);Hemiplegia and hemiparesis Hemiplegia - Right/Left: Left Hemiplegia - dominant/non-dominant: Non-Dominant   Activity Tolerance Patient tolerated treatment well   Patient Left in chair;with call bell/phone within reach;with nursing/sitter in room (sitter in room, reports she can add chair alarm belt)   Nurse Communication          Time: 8295-6213 OT Time Calculation (min): 34 min  Charges: OT General Charges $OT Visit: 1 Visit OT Treatments $Therapeutic Activity: 8-22 mins  Arman Filter., MPH, MS, OTR/L ascom [phone] 06/16/22, 1:09 PM

## 2022-06-16 NOTE — TOC Progression Note (Signed)
Transition of Care [facility]) - Progression Note    Patient Details  Name: Patrick Perez MRN: [id number] Date of Birth: 1966-02-11  Transition of Care [facility]) CM/SW Contact  Marlowe Sax, RN Phone Number: 06/16/2022, 4:27 PM  Clinical Narrative:    Submitted for PASSR, it is pending   Expected Discharge Plan:  (TBD) Barriers to Discharge: Psych Bed not available  Expected Discharge Plan and Services   Discharge Planning Services: CM Consult   Living arrangements for the past 2 months: Single Family Home                 DME Arranged:  (tbd)                     Social Determinants of Health (SDOH) Interventions SDOH Screenings   Food Insecurity: No Food Insecurity (08/29/2017)  Transportation Needs: No Transportation Needs (08/29/2017)  Depression (PHQ2-9): Medium Risk (07/07/2021)  Financial Resource Strain: Low Risk  (08/29/2017)  Physical Activity: Inactive (08/29/2017)  Social Connections: Unknown (08/29/2017)  Stress: No Stress Concern Present (08/29/2017)  Tobacco Use: Medium Risk (05/09/2022)    Readmission Risk Interventions     No data to display

## 2022-06-16 NOTE — Plan of Care (Signed)
  Problem: Health Behavior/Discharge Planning: Goal: Ability to manage health-related needs will improve Outcome: Progressing   Problem: Nutrition: Goal: Adequate nutrition will be maintained Outcome: Progressing   Problem: Coping: Goal: Level of anxiety will decrease Outcome: Progressing   

## 2022-06-17 DIAGNOSIS — I639 Cerebral infarction, unspecified: Secondary | ICD-10-CM | POA: Diagnosis not present

## 2022-06-17 LAB — GLUCOSE, CAPILLARY
Glucose-Capillary: 110 mg/dL — ABNORMAL HIGH (ref 70–99)
Glucose-Capillary: 114 mg/dL — ABNORMAL HIGH (ref 70–99)
Glucose-Capillary: 146 mg/dL — ABNORMAL HIGH (ref 70–99)
Glucose-Capillary: 113 mg/dL — ABNORMAL HIGH (ref 70–99)
Glucose-Capillary: 140 mg/dL — ABNORMAL HIGH (ref 70–99)

## 2022-06-17 MED ORDER — HALOPERIDOL LACTATE 5 MG/ML IJ SOLN: 5.0000 mg | Freq: Three times a day (TID) | INTRAMUSCULAR | Status: DC | PRN

## 2022-06-17 NOTE — Plan of Care (Signed)

## 2022-06-17 NOTE — Progress Notes (Signed)
Physical Therapy Treatment Patient Details Name: Patrick Perez MRN: [id number] DOB: 1965/10/09 Today's Date: 06/17/2022   History of Present Illness Pt is a 57 yo male with a past medical history significant for prior CVA (right thalamic infarct 06/01/2021 and right basal ganglia hemorrhage 06/08/2021) with left-sided hemiparesis, dysphagia, rheumatoid arthritis, type 2 diabetes mellitus who presents to [facility] ED on 06/08/2022 due to intentional drug overdose.  MD assessment includes: acute stroke due to ischemia, acute metabolic encephalopathy, hyponatremia, intentional drug overdose, and hypomagnesemia.    PT Comments    Pt was long sitting in bed upon arrival. Difficult to understand due to previous CVA however, pt is A and O x 4. Voices frustration about overall situation but did agree to OOB activity. Pt was able to exit bed, stand, and ambulate ~ 70 ft with LUE platform RW (uses at baseline). Pt is easily distracted but overall tolerated OOB activity well. Acute PT will continue to follow and progress per current POC. Pt voiced concerns/inability to sign over disability check. If pt elects to go home, recommend 24/7 supervision.    Recommendations for follow up therapy are one component of a multi-disciplinary discharge planning process, led by the attending physician.  Recommendations may be updated based on patient status, additional functional criteria and insurance authorization.  Follow Up Recommendations  Can patient physically be transported by private vehicle: No    Assistance Recommended at Discharge Frequent or constant Supervision/Assistance  Patient can return home with the following A little help with walking and/or transfers;A lot of help with bathing/dressing/bathroom;Assistance with cooking/housework;Direct supervision/assist for medications management;Direct supervision/assist for financial management;Assist for transportation;Help with stairs or ramp for entrance   Equipment  Recommendations  Other (comment) (defer to next level of care. Pt endorses having all equipment needs met)       Precautions / Restrictions Precautions Precautions: Fall Restrictions Weight Bearing Restrictions: No     Mobility  Bed Mobility Overal bed mobility: Needs Assistance Bed Mobility: Rolling, Sidelying to Sit Rolling: Min guard Sidelying to sit: Min guard Supine to sit: Min guard  General bed mobility comments: increased time to perform with vsc to slow down. Pt is slightly impulsive    Transfers Overall transfer level: Needs assistance Equipment used: Rolling walker (2 wheels) Transfers: Sit to/from Stand Sit to Stand: Min assist  General transfer comment: Min assist to stand from slightly elevated bed height. Vcs for impoved safety and technique    Ambulation/Gait Ambulation/Gait assistance: Min assist Gait Distance (Feet): 70 Feet Assistive device: Left platform walker, Rolling walker (2 wheels) Gait Pattern/deviations: Step-to pattern, Decreased step length - right, Decreased stance time - left Gait velocity: decreased     General Gait Details: Min assist to ambulate 70 ft with platform RW.    Balance Overall balance assessment: Needs assistance Sitting-balance support: Feet supported, Single extremity supported Sitting balance-Leahy Scale: Good     Standing balance support: During functional activity, Bilateral upper extremity supported, Reliant on assistive device for balance Standing balance-Leahy Scale: Fair       Cognition Arousal/Alertness: Awake/alert Behavior During Therapy: WFL for tasks assessed/performed Overall Cognitive Status: No family/caregiver present to determine baseline cognitive functioning  General Comments: Pt is A and agreeable for session but very frustrated about situation        Exercises Other Exercises Other Exercises: Pt was upset about possibility og disablity check being taken. returned to explain process to try to  get pt to relax.        Pertinent Vitals/Pain  Pain Assessment Pain Assessment: No/denies pain Pain Score: 0-No pain Pain Intervention(s): Monitored during session, Limited activity within patient's tolerance     PT Goals (current goals can now be found in the care plan section) Acute Rehab PT Goals Patient Stated Goal: none stated Progress towards PT goals: Progressing toward goals    Frequency    Min 3X/week      PT Plan Current plan remains appropriate    Co-evaluation     PT goals addressed during session: Mobility/safety with mobility;Balance;Proper use of DME        AM-PAC PT "6 Clicks" Mobility   Outcome Measure  Help needed turning from your back to your side while in a flat bed without using bedrails?: A Little Help needed moving from lying on your back to sitting on the side of a flat bed without using bedrails?: A Little Help needed moving to and from a bed to a chair (including a wheelchair)?: A Little Help needed standing up from a chair using your arms (e.g., wheelchair or bedside chair)?: A Little Help needed to walk in hospital room?: A Little Help needed climbing 3-5 steps with a railing? : A Lot 6 Click Score: 17    End of Session   Activity Tolerance: Patient tolerated treatment well Patient left: in bed;with call bell/phone within reach;with bed alarm set Nurse Communication: Mobility status PT Visit Diagnosis: Unsteadiness on feet (R26.81);Muscle weakness (generalized) (M62.81);Other abnormalities of gait and mobility (R26.89);Difficulty in walking, not elsewhere classified (R26.2);Hemiplegia and hemiparesis     Time: 1610-9604 PT Time Calculation (min) (ACUTE ONLY): 24 min  Charges:  $Gait Training: 8-22 mins $Therapeutic Activity: 8-22 mins                    Jetta Lout PTA 06/17/22, 1:15 PM

## 2022-06-17 NOTE — Progress Notes (Signed)
PT Cancellation Note  Patient Details Name: Patrick Perez MRN: [id number] DOB: Oct 05, 1965   Cancelled Treatment:     Author returned to room per RN request. Pt getting worked up/agitated/ very concerned about having his disability check taken. Author had TOC call and explain how process works. Eventually pt does relax and calm his behaviors. RN staff aware and will continue to monitor pt closely.     Rushie Chestnut 06/17/2022, 3:02 PM

## 2022-06-17 NOTE — Progress Notes (Signed)
[facility]  - [place] at [facility]   PATIENT NAME: Patrick Perez    MR#:  [id number]  DATE OF BIRTH:  1966/02/13  SUBJECTIVE:  no family at bedside.  Patient more cooperative and doing well with PT. He is able to ambulate with one person inside. Difficulty using walker given left hand weakness. Saddle ideation VITALS:  Blood pressure 107/88, pulse 92, temperature (!) 97.3 F (36.3 C), temperature source Axillary, resp. rate 19, height 5\' 7"  (1.702 m), weight (!) 163.5 kg, SpO2 97 %.  PHYSICAL EXAMINATION:   GENERAL:  57 y.o.-year-old patient with no acute distress.  LUNGS: Normal breath sounds bilaterally, no wheezing CARDIOVASCULAR: S1, S2 normal. No murmur   ABDOMEN: Soft, nontender, nondistended. Bowel sounds present.  EXTREMITIES: No  edema b/l.    NEUROLOGIC: nonfocal  patient is alert and awake, restless, garbled speech with left facial droop. Left upper and lower extremity hemiparesis. SKIN: No obvious rash, lesion, or ulcer.   LABORATORY PANEL:  CBC Recent Labs  Lab 06/13/22 0421  WBC 6.2  HGB 14.1  HCT 39.9  PLT 208     Chemistries  Recent Labs  Lab 06/12/22 0420 06/13/22 0421  NA 134* 136  K 3.7 3.8  CL 104 105  CO2 24 23  GLUCOSE 120* 133*  BUN <5* 7  CREATININE 0.71 0.65  CALCIUM 8.7* 8.9  MG 1.6*  --   AST 15  --   ALT 15  --   ALKPHOS 81  --   BILITOT 0.8  --      RADIOLOGY:  No results found.  Assessment and Plan 57 year old male with a past medical history significant for prior CVA (right thalamic infarct 06/01/2021 and right basal ganglia hemorrhage 06/08/2021) with left-sided hemiparesis, dysphagia, rheumatoid arthritis, type 2 diabetes mellitus who presents to [facility] ED on 06/08/2022 due to intentional drug overdose. Patient is currently intubated and sedated, therefore history is obtained from patient's significant other at bedside along with chart review.  Patient's significant other at bedside reports that the patient  called her and his mother today telling them that he had taken too many medications intentionally.   4/29 Echocardiogram showed a normal ejection fraction.  EEG did not have evidence of a left echogenicity arising from the right frontal central region secondary to encephalomalacia.  Neurology does not want to start any antiseizure medications.  Speech therapy put on a dysphagia 1 diet.  Will get physical therapy and Occupational Therapy consultations.   4/30.  CT angiogram shows occlusion of the right vertebral artery.  Patient worked with physical therapy.  Patient did well with a swallow evaluation today and upgraded on diet.  5/1: patient quite agitated and restless. Wants to leave. Not able to do much with therapy he is a 2 person assist. Calling out for his girlfriend. Discussed with psychiatry nurse practitioner regarding his agitation. Added PRN Ativan and Haldol. Started on Seroquel by psych -- psych recommending inpatient behavioral medicine admission once medically stable  5/2: overall improving working with PT. Ambulated 80 feet with PT Discussed with psych nurse practitioner Patrick Perez. Okay to DC sitter and discontinue IVC.  5/3: sitter and IVC discontinued per discussion with psych nurse practitioner. Discussed with TOC for searching rehab   Acute stroke due to ischemia [facility]) --MRI of the brain shows a 7 mm focus of diffusion abnormality involving the right paramedian midbrain likely reflecting evolving subacute small vessel infarct. --  Neurology recommended Plavix.   --LDL 86.  Cont Lipitor.   --Placed on a diet --tolerating ok --PT and OT evaluation noted. Patient is maximum to person assist he will benefit from rehab.   Vertebral artery occlusion, right --Continue Plavix and Lipitor   Drug overdose, intentional (HCC) --Continued psych follow-up.  Overdose on amitriptyline and Flexeril.  Extubated on 4/24.  -- Psychiatry nurse practitioner recommends Seroquel which has  been started. They also recommend inpatient behavioral medicine transfer once medically clear`. D/w Thurston Hole -- patient does not meet inpatient psych admission. Psych has cleared and okay to DC IVC and sitter per Patrick Perez , NP  Acute metabolic encephalopathy --Likely secondary to drug overdose.  EEG reviewed and case discussed with neurology.  No need for seizure medication at this point.   Hyponatremia Sodium now normal range   Hypomagnesemia Replaced   Rheumatoid arthritis (HCC) --restart folic acid,gabapentin and tramadol.     Family communication :none today Consults : neurology, psychiatry CODE STATUS: full DVT Prophylaxis : enoxaparin Level of care: Med-Surg Status is: Inpatient Remains inpatient appropriate because: awaiting rehab bed. Patient is medically stable for discharge.  TOC to work on discharge planning.  TOTAL TIME TAKING CARE OF THIS PATIENT: 35 minutes.  >50% time spent on counselling and coordination of care  Note: This dictation was prepared with Dragon dictation along with smaller phrase technology. Any transcriptional errors that result from this process are unintentional.  Enedina Finner M.D    [facility]   CC: Primary care physician; [name], MD

## 2022-06-18 ENCOUNTER — Encounter: Payer: Self-pay | Admitting: Internal Medicine

## 2022-06-18 DIAGNOSIS — I639 Cerebral infarction, unspecified: Secondary | ICD-10-CM | POA: Diagnosis not present

## 2022-06-18 LAB — GLUCOSE, CAPILLARY
Glucose-Capillary: 106 mg/dL — ABNORMAL HIGH (ref 70–99)
Glucose-Capillary: 112 mg/dL — ABNORMAL HIGH (ref 70–99)

## 2022-06-18 NOTE — Progress Notes (Signed)
[facility]  - [place] at [facility]   PATIENT NAME: Patrick Perez    MR#:  [id number]  DATE OF BIRTH:  September 03, 1965  SUBJECTIVE:  no family at bedside.  Patient more cooperative and doing well with PT. Marland Kitchen Difficulty using walker given left hand weakness Pt wants to go home VITALS:  Blood pressure 124/78, pulse (!) 103, temperature 98 F (36.7 C), resp. rate 16, height 5\' 7"  (1.702 m), weight (!) 163.5 kg, SpO2 96 %.  PHYSICAL EXAMINATION:   GENERAL:  57 y.o.-year-old patient with no acute distress.  LUNGS: Normal breath sounds bilaterally, no wheezing CARDIOVASCULAR: S1, S2 normal. No murmur   ABDOMEN: Soft, nontender, nondistended. Bowel sounds present.  EXTREMITIES: No  edema b/l.    NEUROLOGIC: nonfocal  patient is alert and awake, restless, garbled speech with left facial droop. Left upper and lower extremity hemiparesis. SKIN: No obvious rash, lesion, or ulcer.   LABORATORY PANEL:  CBC Recent Labs  Lab 06/13/22 0421  WBC 6.2  HGB 14.1  HCT 39.9  PLT 208     Chemistries  Recent Labs  Lab 06/12/22 0420 06/13/22 0421  NA 134* 136  K 3.7 3.8  CL 104 105  CO2 24 23  GLUCOSE 120* 133*  BUN <5* 7  CREATININE 0.71 0.65  CALCIUM 8.7* 8.9  MG 1.6*  --   AST 15  --   ALT 15  --   ALKPHOS 81  --   BILITOT 0.8  --      RADIOLOGY:  No results found.  Assessment and Plan 57 year old male with a past medical history significant for prior CVA (right thalamic infarct 06/01/2021 and right basal ganglia hemorrhage 06/08/2021) with left-sided hemiparesis, dysphagia, rheumatoid arthritis, type 2 diabetes mellitus who presents to [facility] ED on 06/08/2022 due to intentional drug overdose. Patient is currently intubated and sedated, therefore history is obtained from patient's significant other at bedside along with chart review.  Patient's significant other at bedside reports that the patient called her and his mother today telling them that he had taken too many  medications intentionally.   4/29 Echocardiogram showed a normal ejection fraction.  EEG did not have evidence of a left echogenicity arising from the right frontal central region secondary to encephalomalacia.  Neurology does not want to start any antiseizure medications.  Speech therapy put on a dysphagia 1 diet.  Will get physical therapy and Occupational Therapy consultations.   4/30.  CT angiogram shows occlusion of the right vertebral artery.  Patient worked with physical therapy.  Patient did well with a swallow evaluation today and upgraded on diet.  5/1: patient quite agitated and restless. Wants to leave. Not able to do much with therapy he is a 2 person assist. Calling out for his girlfriend. Discussed with psychiatry nurse practitioner regarding his agitation. Added PRN Ativan and Haldol. Started on Seroquel by psych -- psych recommending inpatient behavioral medicine admission once medically stable  5/2: overall improving working with PT. Ambulated 80 feet with PT Discussed with psych nurse practitioner Celso Amy. Okay to DC sitter and discontinue IVC.  5/3: sitter and IVC discontinued per discussion with psych nurse practitioner. Discussed with TOC for searching rehab  5/4: doing well  5/5: patient desires to go home. I asked him if I can talk with his friend Patrick Perez he said yes. Per RN patient got very restless and irritable yesterday after his friend left   Acute stroke due to ischemia [facility]) --MRI of the brain  shows a 7 mm focus of diffusion abnormality involving the right paramedian midbrain likely reflecting evolving subacute small vessel infarct. --  Neurology recommended Plavix.   --LDL 86.  Cont Lipitor.   --Placed on a diet --tolerating ok --PT and OT evaluation noted. Patient is maximum to person assist he will benefit from rehab.   Vertebral artery occlusion, right --Continue Plavix and Lipitor   Drug overdose, intentional (HCC) --Continued psych follow-up.   Overdose on amitriptyline and Flexeril.  Extubated on 4/24.  -- Psychiatry nurse practitioner recommends Seroquel which has been started. They also recommend inpatient behavioral medicine transfer once medically clear`. D/w Thurston Hole -- patient does not meet inpatient psych admission. Psych has cleared and okay to DC IVC and sitter per Celso Amy , NP  Acute metabolic encephalopathy --Likely secondary to drug overdose.  EEG reviewed and case discussed with neurology.  No need for seizure medication at this point.   Hyponatremia Sodium now normal range   Hypomagnesemia Replaced   Rheumatoid arthritis (HCC) --restart folic acid,gabapentin and tramadol.   Spoke with Patrick Perez patient's girlfriend over the phone. She is wondering if she patient is going to be transferred to [facility] for inpatient psych. Explained her psych here has cleared him and I will not be able to transfer him to [place] for psych. My recommendation is for patient to go to rehab for gaining physical strength. Patrick Perez tells me patient has home health through [facility] already set up and she wishes to take patient home tomorrow. About was discussed with TOC.  Family communication :Patrick Perez girlfriend Consults : neurology, psychiatry CODE STATUS: full DVT Prophylaxis : enoxaparin Level of care: Med-Surg Status is: Inpatient Remains inpatient appropriate because: awaiting rehab bed. Patient is medically stable for discharge.  Will discharge tomorrow per girlfriends request. Home health will be resumed  TOTAL TIME TAKING CARE OF THIS PATIENT: 35 minutes.  >50% time spent on counselling and coordination of care  Note: This dictation was prepared with Dragon dictation along with smaller phrase technology. Any transcriptional errors that result from this process are unintentional.  Enedina Finner M.D    [facility]   CC: Primary care physician; [name], MD

## 2022-06-18 NOTE — TOC Progression Note (Signed)
Transition of Care [facility]) - Progression Note    Patient Details  Name: Patrick Perez MRN: [id number] Date of Birth: 1965-02-16  Transition of Care [facility] CM/SW Contact  Bing Quarry, RN Phone Number: 06/18/2022, 1:41 PM  Clinical Narrative:  5/5: IVC no longer in effect. Per provider, patient's girlfriend is picking pateint up Monday 06/19/22 and stated to provider that she would contact/set up [facility] via [facility] where patient has been active before. Gabriel Cirri RN CM     Expected Discharge Plan:  (TBD) Barriers to Discharge: Psych Bed not available  Expected Discharge Plan and Services   Discharge Planning Services: CM Consult   Living arrangements for the past 2 months: Single Family Home                 DME Arranged:  (tbd)                     Social Determinants of Health (SDOH) Interventions SDOH Screenings   Food Insecurity: No Food Insecurity (08/29/2017)  Transportation Needs: No Transportation Needs (08/29/2017)  Depression (PHQ2-9): Medium Risk (07/07/2021)  Financial Resource Strain: Low Risk  (08/29/2017)  Physical Activity: Inactive (08/29/2017)  Social Connections: Unknown (08/29/2017)  Stress: No Stress Concern Present (08/29/2017)  Tobacco Use: Medium Risk (06/18/2022)    Readmission Risk Interventions     No data to display

## 2022-06-18 NOTE — Plan of Care (Signed)
  Problem: Education: Goal: Ability to describe self-care measures that may prevent or decrease complications (Diabetes Survival Skills Education) will improve Outcome: Progressing Goal: Individualized Educational Video(s) Outcome: Progressing   Problem: Coping: Goal: Ability to adjust to condition or change in health will improve Outcome: Progressing   Problem: Fluid Volume: Goal: Ability to maintain a balanced intake and output will improve Outcome: Progressing   Problem: Health Behavior/Discharge Planning: Goal: Ability to identify and utilize available resources and services will improve Outcome: Progressing Goal: Ability to manage health-related needs will improve Outcome: Progressing   Problem: Metabolic: Goal: Ability to maintain appropriate glucose levels will improve Outcome: Progressing   Problem: Nutritional: Goal: Maintenance of adequate nutrition will improve Outcome: Progressing Goal: Progress toward achieving an optimal weight will improve Outcome: Progressing   Problem: Skin Integrity: Goal: Risk for impaired skin integrity will decrease Outcome: Progressing   Problem: Tissue Perfusion: Goal: Adequacy of tissue perfusion will improve Outcome: Progressing   Problem: Education: Goal: Knowledge of General Education information will improve Description: Including pain rating scale, medication(s)/side effects and non-pharmacologic comfort measures Outcome: Progressing   Problem: Health Behavior/Discharge Planning: Goal: Ability to manage health-related needs will improve Outcome: Progressing   Problem: Clinical Measurements: Goal: Ability to maintain clinical measurements within normal limits will improve Outcome: Progressing Goal: Will remain free from infection Outcome: Progressing Goal: Diagnostic test results will improve Outcome: Progressing Goal: Respiratory complications will improve Outcome: Progressing Goal: Cardiovascular complication will  be avoided Outcome: Progressing   Problem: Activity: Goal: Risk for activity intolerance will decrease Outcome: Progressing   Problem: Nutrition: Goal: Adequate nutrition will be maintained Outcome: Progressing   Problem: Coping: Goal: Level of anxiety will decrease Outcome: Progressing   Problem: Elimination: Goal: Will not experience complications related to bowel motility Outcome: Progressing Goal: Will not experience complications related to urinary retention Outcome: Progressing   Problem: Pain Managment: Goal: General experience of comfort will improve Outcome: Progressing   Problem: Safety: Goal: Ability to remain free from injury will improve Outcome: Progressing   Problem: Skin Integrity: Goal: Risk for impaired skin integrity will decrease Outcome: Progressing   Problem: Health Behavior/Discharge Planning: Goal: Ability to manage health-related needs will improve Outcome: Progressing   Problem: Clinical Measurements: Goal: Will remain free from infection Outcome: Progressing   Problem: Nutrition: Goal: Adequate nutrition will be maintained Outcome: Progressing   Problem: Coping: Goal: Level of anxiety will decrease Outcome: Progressing   

## 2022-06-19 DIAGNOSIS — I639 Cerebral infarction, unspecified: Secondary | ICD-10-CM | POA: Diagnosis not present

## 2022-06-19 MED ORDER — CARVEDILOL 3.125 MG PO TABS: 6.2500 mg | ORAL_TABLET | Freq: Two times a day (BID) | ORAL | 1 refills | Status: AC

## 2022-06-19 MED ORDER — SERTRALINE HCL 50 MG PO TABS: 50.0000 mg | ORAL_TABLET | Freq: Every day | ORAL | 0 refills | Status: DC

## 2022-06-19 NOTE — Progress Notes (Signed)
Physical Therapy Treatment Patient Details Name: Patrick Perez MRN: [id number] DOB: May 12, 1965 Today's Date: 06/19/2022   History of Present Illness Pt is a 57 yo male with a past medical history significant for prior CVA (right thalamic infarct 06/01/2021 and right basal ganglia hemorrhage 06/08/2021) with left-sided hemiparesis, dysphagia, rheumatoid arthritis, type 2 diabetes mellitus who presents to [facility] ED on 06/08/2022 due to intentional drug overdose.  MD assessment includes: acute stroke due to ischemia, acute metabolic encephalopathy, hyponatremia, intentional drug overdose, and hypomagnesemia.    PT Comments    Author arrives to room to find pt lying on floor at FOB, RW is ~12 feet aware, unclear if he was using this, he is not attached to pure wick wall suction at present. Pt brings his index finger to closed lips as if to indicate a desire for author to be quiet about something. Author interogates pt to determine acute needs, injury, recent course of events. Pt asks for a hand to get up off floor, but Thereasa Parkin explains the needs to wait until staff can come to room for a second set of hands and likely use of lift to get pt off floor which is facility policy. Pt impulsively determines he'll just get up by himself despite please. Pt makes his way to EOB into a half kneeling position, as which point, author puts down a falls mat to decrease any additional potential for head to floor interaction. Pt is able to rise to standing with hand on bed, follows cues to have a seat. Pt denies any acute injury or pain, denies any head trauma. Author sees what appears to be old blood on occipital bone Rt behind ear, but pt denies any issue. Additional detail difficult to obtain given heavy baseline dysarthria. Pt is not agreeable to multiple pitches to partake in mobility training with author. He repeatedly makes known that he doesn't need anyone's help with mobility, that he's been s/p CVA for >1 year. Pt left in  bed, alarm on. Pt reminded not to get up unattended, even if staff response is not as timely as he desires. He is not especially agreeable to authors request. MD/RN charge nurse made aware afterward via chart as no staff had come to room but end of session.    Recommendations for follow up therapy are one component of a multi-disciplinary discharge planning process, led by the attending physician.  Recommendations may be updated based on patient status, additional functional criteria and insurance authorization.  Follow Up Recommendations  Can patient physically be transported by private vehicle: Yes    Assistance Recommended at Discharge Frequent or constant Supervision/Assistance  Patient can return home with the following A little help with walking and/or transfers;A lot of help with bathing/dressing/bathroom;Assistance with cooking/housework;Direct supervision/assist for medications management;Direct supervision/assist for financial management;Assist for transportation;Help with stairs or ramp for entrance   Equipment Recommendations       Recommendations for Other Services       Precautions / Restrictions Precautions Precautions: Fall Restrictions Weight Bearing Restrictions: No     Mobility  Bed Mobility Overal bed mobility: Needs Assistance         Sit to supine: Supervision        Transfers Overall transfer level: Needs assistance Equipment used: None Transfers:  (floor to EOB)             General transfer comment: ad lib, impulsive from supine in floor, to sidelying to quadruped to half kneeling at bedside to forward flexed with hand  on mattress and 180 degree pivot to short sitting.    Ambulation/Gait Ambulation/Gait assistance:  (pt refuses)                 Social research officer, government Rank[employer] (Stroke Patients Only)       Balance                                            Cognition  Arousal/Alertness: Awake/alert Behavior During Therapy: Impulsive, Restless                                            Exercises      General Comments        Pertinent Vitals/Pain Pain Assessment Pain Assessment: No/denies pain    Home Living                          Prior Function            PT Goals (current goals can now be found in the care plan section) Acute Rehab PT Goals Patient Stated Goal: none stated PT Goal Formulation: With patient    Frequency    Min 3X/week      PT Plan Current plan remains appropriate    Co-evaluation              AM-PAC PT "6 Clicks" Mobility   Outcome Measure  Help needed turning from your back to your side while in a flat bed without using bedrails?: None Help needed moving from lying on your back to sitting on the side of a flat bed without using bedrails?: None Help needed moving to and from a bed to a chair (including a wheelchair)?: A Little Help needed standing up from a chair using your arms (e.g., wheelchair or bedside chair)?: A Little Help needed to walk in hospital room?: A Little Help needed climbing 3-5 steps with a railing? : A Little 6 Click Score: 20    End of Session   Activity Tolerance: Patient tolerated treatment well;No increased pain Patient left: in bed;with call bell/phone within reach;with bed alarm set Nurse Communication: Mobility status (pt found on floor) PT Visit Diagnosis: Unsteadiness on feet (R26.81);Muscle weakness (generalized) (M62.81);Other abnormalities of gait and mobility (R26.89);Difficulty in walking, not elsewhere classified (R26.2);Hemiplegia and hemiparesis Hemiplegia - Right/Left: Left Hemiplegia - dominant/non-dominant: Non-dominant Hemiplegia - caused by: Cerebral infarction     Time: 1610-9604 PT Time Calculation (min) (ACUTE ONLY): 10 min  Charges:  $Therapeutic Activity: 8-22 mins                     11:40 AM, 06/19/22 Rosamaria Lints, PT, DPT Physical Therapist - [facility]  [phone] (ASCOM)     Tirzah Fross C 06/19/2022, 11:25 AM

## 2022-06-19 NOTE — Discharge Summary (Signed)
Physician Discharge Summary   Patient: Patrick Perez MRN: [id number] DOB: Jul 10, 1965  Admit date:     06/08/2022  Discharge date: 06/19/22  Discharge Physician: Enedina Finner   PCP: Patrick Georges Pap, MD   Recommendations at discharge:    F/u PCP In 1-2 weeks Family to call [facility] for follow up  Discharge Diagnoses: Principal Problem:   Acute stroke due to ischemia [facility]) Active Problems:   Drug overdose, intentional (HCC)   Vertebral artery occlusion, right   Acute metabolic encephalopathy   Hyponatremia   Hypomagnesemia   Rheumatoid arthritis (HCC)   Adjustment disorder with mixed disturbance of emotions and conduct  57 year old male with a past medical history significant for prior CVA (right thalamic infarct 06/01/2021 and right basal ganglia hemorrhage 06/08/2021) with left-sided hemiparesis, dysphagia, rheumatoid arthritis, type 2 diabetes mellitus who presents to [facility] ED on 06/08/2022 due to intentional drug overdose. Patient is currently intubated and sedated, therefore history is obtained from patient's significant other at bedside along with chart review.  Patient's significant other at bedside reports that the patient called her and his mother today telling them that he had taken too many medications intentionally.    4/29 Echocardiogram showed a normal ejection fraction.  EEG did not have evidence of a left echogenicity arising from the right frontal central region secondary to encephalomalacia.  Neurology does not want to start any antiseizure medications.  Speech therapy put on a dysphagia 1 diet.  Will get physical therapy and Occupational Therapy consultations.   4/30.  CT angiogram shows occlusion of the right vertebral artery.  Patient worked with physical therapy.  Patient did well with a swallow evaluation today and upgraded on diet.   5/1: patient quite agitated and restless. Wants to leave. Not able to do much with therapy he is a 2 person assist.  Calling out for his girlfriend. Discussed with psychiatry nurse practitioner regarding his agitation. Added PRN Ativan and Haldol. Started on Seroquel by psych -- psych recommending inpatient behavioral medicine admission once medically stable   5/2: overall improving working with PT. Ambulated 80 feet with PT Discussed with psych nurse practitioner Celso Amy. Okay to DC sitter and discontinue IVC.   5/3: sitter and IVC discontinued per discussion with psych nurse practitioner. Discussed with TOC for searching rehab   5/4: doing well   5/5: patient desires to go home. I asked him if I can talk with his friend Kenney Perez he said yes. Per RN patient got very restless and irritable yesterday after his friend left per RN  5/6: pt standing by himself near the bed. Made him sit and placed BF tray. He wants to go home.    Acute stroke due to ischemia [facility]) --MRI of the brain shows a 7 mm focus of diffusion abnormality involving the right paramedian midbrain likely reflecting evolving subacute small vessel infarct. --  Neurology recommended Plavix.   --LDL 86.  Cont Lipitor.   --Placed on a diet --tolerating ok --PT and OT evaluation noted.  --pt has been wanting to go home. Spoke with [name] and she is alos wanting to take him home. Thye do not want to go for rehab. HH services resumed   Vertebral artery occlusion, right --Continue Plavix and Lipitor   Drug overdose, intentional (HCC) --Continued psych follow-up.  Overdose on amitriptyline and Flexeril.  Extubated on 4/24.  -- Psychiatry nurse practitioner recommends Seroquel which has been started. They also recommend inpatient behavioral medicine transfer once medically clear`. D/w Patrick  Willeen Perez -- patient does not meet inpatient psych admission. Psych has cleared and okay to DC IVC and sitter per Celso Amy , NP --pt to f/u [facility] at discharge   Acute metabolic encephalopathy --Likely secondary to drug overdose.  EEG reviewed and  case discussed with neurology.  No need for seizure medication at this point.   Hyponatremia Sodium now normal range   Hypomagnesemia Replaced   Rheumatoid arthritis (HCC) --restart folic acid,gabapentin and tramadol. --pt takes some shots per GF which can be resumed as out pt     Family communication :Patrick girlfriend 06/18/2022 Consults : neurology, psychiatry CODE STATUS: full DVT Prophylaxis : enoxaparin    Pain control - [place] Controlled Substance Reporting System database was reviewed. and patient was instructed, not to drive, operate heavy machinery, perform activities at heights, swimming or participation in water activities or provide baby-sitting services while on Pain, Sleep and Anxiety Medications; until their outpatient Physician has advised to do so again. Also recommended to not to take more than prescribed Pain, Sleep and Anxiety Medications.   Disposition:  Diet recommendation:  Dysphagia type 2 thin Liquid DISCHARGE MEDICATION: Allergies as of 06/19/2022       Reactions   Oxycodone Hives   Oxycodone-acetaminophen Anxiety   Quetiapine Rash        Medication List     STOP taking these medications    aspirin 81 MG chewable tablet   OneTouch Ultra test strip Generic drug: glucose blood       TAKE these medications    amitriptyline 25 MG tablet Commonly known as: ELAVIL Take 25 mg by mouth at bedtime.   atorvastatin 80 MG tablet Commonly known as: LIPITOR TAKE 1 TABLET BY MOUTH EVERY DAY   carvedilol 3.125 MG tablet Commonly known as: COREG Take 2 tablets (6.25 mg total) by mouth 2 (two) times daily with a meal. What changed: medication strength   clopidogrel 75 MG tablet Commonly known as: PLAVIX TAKE 1 TABLET BY MOUTH EVERY DAY   cyclobenzaprine 10 MG tablet Commonly known as: FLEXERIL Take 10 mg by mouth 3 (three) times daily as needed for muscle spasms.   ferrous sulfate 325 (65 FE) MG tablet Take by mouth.   folic acid 1  MG tablet Commonly known as: FOLVITE Take by mouth.   gabapentin 300 MG capsule Commonly known as: NEURONTIN Take 1 capsule (300 mg total) by mouth at bedtime. Dr Alfredo Batty What changed: when to take this   melatonin 3 MG Tabs tablet Take by mouth.   metFORMIN 500 MG tablet Commonly known as: GLUCOPHAGE TAKE 1 TABLET BY MOUTH 2 TIMES DAILY WITH A MEAL.   methotrexate (PF) 50 MG/2ML injection Inject 25 mg into the muscle once a week.   OneTouch Delica Lancets 33G Misc USE TO TEST ONCE DAILY   sertraline 50 MG tablet Commonly known as: ZOLOFT Take 1 tablet (50 mg total) by mouth daily. Start taking on: Jun 21, 2022   Simponi 50 MG/0.5ML Soaj Generic drug: Golimumab Inject 50 mg into the skin every 30 (thirty) days.   traMADol 50 MG tablet Commonly known as: ULTRAM Take by mouth every 6 (six) hours as needed.   Vitamin D3 10 MCG (400 UNIT) tablet Take by mouth.        Follow-up Information     Mangel, Benison Pap, MD. Schedule an appointment as soon as possible for a visit in 1 week(s).   Specialty: Family Medicine Why: hospital f//u Contact information: [phone]  [address] [place] [address] [phone]         [facility]. Call.   Specialty: Behavioral Health Why: family to call and make appt for pt to be seen with [facility] psychiatry Contact information: [address] [place] [address] [phone]               Discharge Exam: Filed Weights   06/17/22 0500 06/18/22 2246 06/19/22 0500  Weight: (!) 163.5 kg 73.9 kg 73.9 kg   GENERAL:  58 y.o.-year-old patient with no acute distress.  LUNGS: Normal breath sounds bilaterally, no wheezing CARDIOVASCULAR: S1, S2 normal. No murmur   ABDOMEN: Soft, nontender, nondistended. Bowel sounds present.  EXTREMITIES: No  edema b/l.    NEUROLOGIC: nonfocal  patient is alert and awake, restless, dysarthria with left facial droop. Left upper and lower extremity hemiparesis. SKIN:  No obvious rash, lesion, or ulcer.   Condition at discharge: fair  The results of significant diagnostics from this hospitalization (including imaging, microbiology, ancillary and laboratory) are listed below for reference.   Imaging Studies: DG Swallowing Func-Speech Pathology  Result Date: 06/13/2022 Table formatting from the original result was not included. Modified Barium Swallow Study Patient Details Name: Flynt Schreckengost MRN: [id number] Date of Birth: March 08, 1965 Today's Date: 06/13/2022 HPI/PMH: HPI: Pt is a 57 y.o. male with a PMH of  Alcohol use disorder and recent admit to [facility] hospital 02/2022, PTSD, T2DM, RA, and prior CVA w/ LEFT sided weakness (right thalamic infarct 06/01/2021 and right basal ganglia hemorrhage 06/08/2021) who presented to [facility] ED on 05/19/2022 with reports of worsening dysphagia. (Of note, he had been seen for complaints if slurred speech at [facility] 04/2022 but did not accept transfer to [facility] per chart notes.)  MRI revealed small infarct in the right midbrain. Plan to admit pt, however he refused admit then.  Per chart, pt presented to outside hospital in [place], [place] where he was diagnosed with aspiration pneumonia. On 06/08/2022, pt presented to [facility] ED d/t obtunded state from intentional overdose of 750 mg of amitriptyline and Flexeril. Pt intubated less than 24 hours.  CXR: Bilateral patchy parenchymal interstitial lung changes. MRI: 7 mm focus of mild diffusion abnormality involving the right  paramedian midbrain, likely reflecting an evolving subacute small  vessel infarct. No associated hemorrhage or mass effect.  2. Chronic encephalomalacia and gliosis involving the right basal  ganglia/external capsule, likely reflecting changes of prior  hemorrhagic infarct. Associated wallerian degeneration.  3. Additional small remote lacunar infarct at the right thalamus. Clinical Impression: Patient presents w/ functional pharyngeal phase swallowing and oral phase dysphagia in  setting of Baseline CVAs w/ residual Left sided weakness(05/2021). No aspiration nor laryngeal penetration were noted during this study. Pt endorsed a Baseline of mild dysphagia and "slurred speech" in past year but could not describe details. Pt appears to present w/ Cognitive-communication decline at Baseline. There appears to have been recent changes in overall status in past 1-2 months per chart notes. Pt has been on a dysphagia level 2 w/ Nectar liquids once Alert/Awake State was consistent this admit. Cranial Nerve Impairment is suspected at Baseline; this impacts coordinated lingual/labial movements; Left strength/ROM. Oral stage is characterized by decreased coordination/strength of lip closure w/ tsp/cup trials of thin liquids intermittently. This resulted in spillage to mid-chin x3 occurences. Min loss of bolus cohesion/containment into the lateral buccal cavity/floor noted w/ liquids; thins, nectar intermittently -- lacking bottom dentition also. Mastication of solid trial was c/b  slower prolonged chewing, slightly disorganized. Recollection was complete for A-P transfer. Anterior to posterior transit of all trial consistencies was timely. Any trace+ oral/tongue surface residue is cleared by independent, dry swallow. Swallow initiation occurs at the level of the valleculae for most trials; posterior laryngeal surface of epiglottis w/ thins intermittently. Pharyngeal stage is noted for adequate tongue base retraction, adequate hyolaryngeal excursion, and adequate pharyngeal constriction. Epiglottic deflection is complete; there was No penetration nor aspiration. Pharyngeal stripping wave is complete. Amplitude/duration of cricopharyngeus opening is WFL. There is adequate/complete clearance through the cervical esophagus.  Consistencies tested were thin liquids x2 tsps, 2 single cup sip, 2 sequential sips, nectar x1 tsp, 2 single cup sip, honey x1 tsp, pudding x1 tsp, regular solid (1/2 graham cracker with  pudding).  Recommend patient transition to a MINCED foods diet w/ thin liquids via CUP; aspiration precautions including helping to hold Cup when drinking. 100% Supervision w/ meals, pill swallowing. Educated pt verbally and in handout form re: strategies including moistening chopped, dry meats, alternating solids and liquids, single sips, reducing distractions at meals. Factors that may increase risk of adverse event in presence of aspiration Rubye Oaks & Clearance Coots 2021): Factors that may increase risk of adverse event in presence of aspiration Rubye Oaks & Clearance Coots 2021): Reduced cognitive function (multiple CVAs per chart) Recommendations/Plan: Swallowing Evaluation Recommendations Swallowing Evaluation Recommendations Recommendations: PO diet -- 100% Supervision PO Diet Recommendation: Dysphagia 2 (Finely chopped); Thin liquids (Level 0) Liquid Administration via: Cup; No straw -- pt to help Hold Cup when drinking for Cognitive input Medication Administration: Whole meds with puree (vs need to Crush in Puree) Supervision: Staff to assist with self-feeding; Full assist for feeding; Full supervision/cueing for swallowing strategies (pt to help hold Cup to drink) Swallowing strategies  : Minimize environmental distractions; Slow rate; Small bites/sips; Check for anterior loss; Follow solids with liquids Postural changes: Position pt fully upright for meals; Stay upright 30-60 min after meals (Reflux precs.) Oral care recommendations: Oral care BID (2x/day); Staff/trained caregiver to provide oral care Recommended consults: Consider dietitian consultation Caregiver Recommendations: 100% Supervision at meals; support w/ feeding at meals. Treatment Plan Treatment Plan Treatment recommendations: Therapy as outlined in treatment plan below Follow-up recommendations: Follow physicians's recommendations for discharge plan and follow up therapies Recommendations Comment: 100% Supervision at meals Functional status assessment:  Patient has had a recent decline in their functional status and demonstrates the ability to make significant improvements in function in a reasonable and predictable amount of time. Treatment frequency: Min 2x/week Treatment duration: 2 weeks Interventions: Aspiration precaution training; Oropharyngeal exercises; Patient/family education; Diet toleration management by SLP; Compensatory techniques Recommendations Recommendations for follow up therapy are one component of a multi-disciplinary discharge planning process, led by the attending physician.  Recommendations may be updated based on patient status, additional functional criteria and insurance authorization. Assessment: Orofacial Exam: Orofacial Exam Oral Cavity: Oral Hygiene: WFL Oral Cavity - Dentition: Adequate natural dentition (missing front tooth) Orofacial Anatomy: WFL Oral Motor/Sensory Function: Suspected cranial nerve impairment CN V - Trigeminal: Left motor impairment CN VII - Facial: Left motor impairment CN IX - Glossopharyngeal, CN X - Vagus: WFL CN XII - Hypoglossal: Left motor impairment Anatomy: Anatomy: WFL Boluses Administered: Boluses Administered Boluses Administered: Thin liquids (Level 0); Mildly thick liquids (Level 2, nectar thick); Moderately thick liquids (Level 3, honey thick); Puree; Solid  Oral Impairment Domain: Oral Impairment Domain Lip Closure: Escape progressing to mid-chin (w/ thins by TSP; escape from interlabial space x1) Tongue control during bolus  hold: Escape to lateral buccal cavity/floor of mouth (thins, nectar intermittently -- lacking bottom dentition also) Bolus preparation/mastication: Slow prolonged chewing/mashing with complete recollection; Disorganized chewing/mashing with solid pieces of bolus unchewed (w/ solid trial) Bolus transport/lingual motion: Brisk tongue motion (w/ all) Oral residue: Trace residue lining oral structures (intermittent) Location of oral residue : Tongue (cleared w/ independent f/u  swallow) Initiation of pharyngeal swallow : Posterior laryngeal surface of the epiglottis (thins)  Pharyngeal Impairment Domain: Pharyngeal Impairment Domain Soft palate elevation: No bolus between soft palate (SP)/pharyngeal wall (PW) Laryngeal elevation: Complete superior movement of thyroid cartilage with complete approximation of arytenoids to epiglottic petiole Anterior hyoid excursion: Complete anterior movement Epiglottic movement: Complete inversion (majority of trials) Laryngeal vestibule closure: Complete, no air/contrast in laryngeal vestibule Pharyngeal stripping wave : Present - complete Pharyngeal contraction (A/P view only): N/A Pharyngoesophageal segment opening: Complete distension and complete duration, no obstruction of flow Tongue base retraction: No contrast between tongue base and posterior pharyngeal wall (PPW) Pharyngeal residue: Complete pharyngeal clearance Location of pharyngeal residue: N/A  Esophageal Impairment Domain: Esophageal Impairment Domain Esophageal clearance upright position: Complete clearance, esophageal coating Pill: Esophageal Impairment Domain Esophageal clearance upright position: Complete clearance, esophageal coating Penetration/Aspiration Scale Score: Penetration/Aspiration Scale Score 1.  Material does not enter airway: Thin liquids (Level 0); Mildly thick liquids (Level 2, nectar thick); Moderately thick liquids (Level 3, honey thick); Puree; Solid Compensatory Strategies: Compensatory Strategies Compensatory strategies: No   General Information: Caregiver present: No  Diet Prior to this Study: Dysphagia 1 (pureed); Mildly thick liquids (Level 2, nectar thick)   Temperature : Normal   Respiratory Status: WFL   Supplemental O2: None (Room air)   History of Recent Intubation: Yes  Behavior/Cognition: Alert; Cooperative; Pleasant mood; Distractible; Requires cueing (baseline cognitive decline) Self-Feeding Abilities: Needs hand-over-hand assist for feeding; Dependent  for feeding (can help hold Cup to drink) Baseline vocal quality/speech: Hypophonia/low volume Volitional Cough: Able to elicit Volitional Swallow: Able to elicit Exam Limitations: No limitations Goal Planning: Prognosis for improved oropharyngeal function: Fair Barriers to Reach Goals: Cognitive deficits; Language deficits; Time post onset; Severity of deficits; Overall medical prognosis Barriers/Prognosis Comment: declined Cognitive functioning s/p multiple CVAs; Left sided weakness; need for Supervision at meals Patient/Family Stated Goal: none stated Consulted and agree with results and recommendations: Patient; Physician; Nurse; Dietitian (Sitter) Pain: Pain Assessment Pain Assessment: No/denies pain Faces Pain Scale: 0 Facial Expression: 0 Body Movements: 0 Muscle Tension: 0 Compliance with ventilator (intubated pts.): N/A Vocalization (extubated pts.): 0 CPOT Total: 0 End of Session: Start Time:SLP Start Time (ACUTE ONLY): 1300 Stop Time: SLP Stop Time (ACUTE ONLY): 1400 Time Calculation:SLP Time Calculation (min) (ACUTE ONLY): 60 min Charges: SLP Evaluations $ SLP Speech Visit: 1 Visit SLP Evaluations $BSS Swallow: 1 Procedure $MBS Swallow: 1 Procedure $ SLP EVAL LANGUAGE/SOUND PRODUCTION: 1 Procedure $Swallowing Treatment: 1 Procedure SLP visit diagnosis: SLP Visit Diagnosis: Dysphagia, oral phase (R13.11) Past Medical History: Past Medical History: Diagnosis Date  Anxiety   Arthritis   rheumatoid  Depression   Diabetes mellitus without complication (HCC)   Stroke (HCC)  Past Surgical History: Past Surgical History: Procedure Laterality Date  TIBIA FRACTURE SURGERY Left   TONSILLECTOMY   Jerilynn Som, MS, CCC-SLP Speech Language Pathologist Rehab Services; [facility] [phone] (ascom) Watson,Katherine 06/13/2022, 4:38 PM  CT ANGIO HEAD NECK W WO CM  Result Date: 06/12/2022 CLINICAL DATA:  Acute neurologic deficit EXAM: CT ANGIOGRAPHY HEAD AND NECK WITH AND WITHOUT CONTRAST TECHNIQUE:  Multidetector CT imaging  of the head and neck was performed using the standard protocol during bolus administration of intravenous contrast. Multiplanar CT image reconstructions and MIPs were obtained to evaluate the vascular anatomy. Carotid stenosis measurements (when applicable) are obtained utilizing NASCET criteria, using the distal internal carotid diameter as the denominator. RADIATION DOSE REDUCTION: This exam was performed according to the departmental dose-optimization program which includes automated exposure control, adjustment of the mA and/or kV according to patient size and/or use of iterative reconstruction technique. CONTRAST:  75mL OMNIPAQUE IOHEXOL 350 MG/ML SOLN COMPARISON:  Brain MRI 06/09/2022 MRA neck 06/01/2021 FINDINGS: CT HEAD FINDINGS Brain: There is no mass, hemorrhage or extra-axial collection. Mild right hemispheric volume loss with ex vacuo dilatation of the right lateral ventricle. Old right gangliocapsular small vessel infarct. The brain parenchyma is normal. Skull: The visualized skull base, calvarium and extracranial soft tissues are normal. Sinuses/Orbits: No fluid levels or advanced mucosal thickening of the visualized paranasal sinuses. No mastoid or middle ear effusion. The orbits are normal. CTA NECK FINDINGS SKELETON: There is no bony spinal canal stenosis. No lytic or blastic lesion. OTHER NECK: Normal pharynx, larynx and major salivary glands. No cervical lymphadenopathy. Unremarkable thyroid gland. UPPER CHEST: No pneumothorax or pleural effusion. No nodules or masses. AORTIC ARCH: There is no calcific atherosclerosis of the aortic arch. There is no aneurysm, dissection or hemodynamically significant stenosis of the visualized portion of the aorta. Conventional 3 vessel aortic branching pattern. The visualized proximal subclavian arteries are widely patent. RIGHT CAROTID SYSTEM: Normal without aneurysm, dissection or stenosis. LEFT CAROTID SYSTEM: Normal without aneurysm,  dissection or stenosis. VERTEBRAL ARTERIES: Left dominant configuration. The right vertebral artery V1 and proximal V2 segments are occluded with reconstitution of the distal V2 segment. The left vertebral artery is normal. CTA HEAD FINDINGS POSTERIOR CIRCULATION: --Vertebral arteries: Normal V4 segments. --Inferior cerebellar arteries: Normal. --Basilar artery: Normal. --Superior cerebellar arteries: Normal. --Posterior cerebral arteries (PCA): Normal. ANTERIOR CIRCULATION: --Intracranial internal carotid arteries: Normal. --Anterior cerebral arteries (ACA): Normal. Both A1 segments are present. Patent anterior communicating artery (a-comm). --Middle cerebral arteries (MCA): Normal. VENOUS SINUSES: As permitted by contrast timing, patent. ANATOMIC VARIANTS: None Review of the MIP images confirms the above findings. IMPRESSION: 1. Occlusion of the right vertebral artery V1 and proximal V2 segments with reconstitution of the distal V2 segment. This is age indeterminate but new since 06/01/2021. 2. No intracranial arterial occlusion or high-grade stenosis. 3. Mild right hemispheric volume loss with ex vacuo dilatation of the right lateral ventricle. Old right gangliocapsular small vessel infarct. Electronically Signed   By: Deatra Robinson M.D.   On: 06/12/2022 22:41   ECHOCARDIOGRAM COMPLETE BUBBLE STUDY  Result Date: 06/12/2022    ECHOCARDIOGRAM REPORT   Patient Name:   GOULD SNIDE Date of Exam: 06/12/2022 Medical Rec #:  [id number]     Height:       67.0 in Accession #:    [id number]    Weight:       173.3 lb Date of Birth:  1965-09-21     BSA:          1.903 m Patient Age:    56 years      BP:           106/85 mmHg Patient Gender: M             HR:           115 bpm. Exam Location:  [facility] Procedure: 2D Echo, Cardiac Doppler, Color Doppler and Saline Contrast Bubble  Study Indications:     Stroke  History:         Patient has prior history of Echocardiogram examinations, most                  recent  06/01/2021. Stroke, Signs/Symptoms:Shortness of Breath;                  Risk Factors:Diabetes.  Sonographer:     Mikki Harbor Referring Phys:  [id number] Malachi Carl STACK Diagnosing Phys: Lorine Bears MD IMPRESSIONS  1. Left ventricular ejection fraction, by estimation, is 55 to 60%. The left ventricle has normal function. The left ventricle has no regional wall motion abnormalities. Left ventricular diastolic parameters are consistent with Grade I diastolic dysfunction (impaired relaxation).  2. Right ventricular systolic function is normal. The right ventricular size is normal. Tricuspid regurgitation signal is inadequate for assessing PA pressure.  3. The mitral valve is normal in structure. No evidence of mitral valve regurgitation. No evidence of mitral stenosis.  4. The aortic valve is normal in structure. Aortic valve regurgitation is not visualized. No aortic stenosis is present.  5. The inferior vena cava is normal in size with greater than 50% respiratory variability, suggesting right atrial pressure of 3 mmHg. FINDINGS  Left Ventricle: Left ventricular ejection fraction, by estimation, is 55 to 60%. The left ventricle has normal function. The left ventricle has no regional wall motion abnormalities. The left ventricular internal cavity size was normal in size. There is  borderline left ventricular hypertrophy. Left ventricular diastolic parameters are consistent with Grade I diastolic dysfunction (impaired relaxation). Right Ventricle: The right ventricular size is normal. No increase in right ventricular wall thickness. Right ventricular systolic function is normal. Tricuspid regurgitation signal is inadequate for assessing PA pressure. Left Atrium: Left atrial size was normal in size. Right Atrium: Right atrial size was normal in size. Pericardium: There is no evidence of pericardial effusion. Mitral Valve: The mitral valve is normal in structure. No evidence of mitral valve regurgitation. No evidence  of mitral valve stenosis. MV peak gradient, 5.3 mmHg. The mean mitral valve gradient is 1.0 mmHg. Tricuspid Valve: The tricuspid valve is normal in structure. Tricuspid valve regurgitation is not demonstrated. No evidence of tricuspid stenosis. Aortic Valve: The aortic valve is normal in structure. Aortic valve regurgitation is not visualized. No aortic stenosis is present. Aortic valve mean gradient measures 4.0 mmHg. Aortic valve peak gradient measures 8.4 mmHg. Aortic valve area, by VTI measures 1.87 cm. Pulmonic Valve: The pulmonic valve was normal in structure. Pulmonic valve regurgitation is not visualized. No evidence of pulmonic stenosis. Aorta: The aortic root is normal in size and structure. Venous: The inferior vena cava is normal in size with greater than 50% respiratory variability, suggesting right atrial pressure of 3 mmHg. IAS/Shunts: No atrial level shunt detected by color flow Doppler. Agitated saline contrast was given intravenously to evaluate for intracardiac shunting.  LEFT VENTRICLE PLAX 2D LVIDd:         3.70 cm LVIDs:         2.40 cm LV PW:         1.10 cm LV IVS:        1.10 cm LVOT diam:     2.00 cm LV SV:         42 LV SV Index:   22 LVOT Area:     3.14 cm  RIGHT VENTRICLE RV Basal diam:  3.10 cm RV Mid diam:    2.90 cm  RV S prime:     15.40 cm/s TAPSE (M-mode): 2.3 cm LEFT ATRIUM             Index        RIGHT ATRIUM           Index LA diam:        2.90 cm 1.52 cm/m   RA Area:     12.10 cm LA Vol (A2C):   26.4 ml 13.88 ml/m  RA Volume:   28.20 ml  14.82 ml/m LA Vol (A4C):   30.9 ml 16.24 ml/m LA Biplane Vol: 29.9 ml 15.71 ml/m  AORTIC VALVE                    PULMONIC VALVE AV Area (Vmax):    2.14 cm     PV Vmax:       0.95 m/s AV Area (Vmean):   1.86 cm     PV Peak grad:  3.6 mmHg AV Area (VTI):     1.87 cm AV Vmax:           145.00 cm/s AV Vmean:          95.700 cm/s AV VTI:            0.227 m AV Peak Grad:      8.4 mmHg AV Mean Grad:      4.0 mmHg LVOT Vmax:         99.00  cm/s LVOT Vmean:        56.600 cm/s LVOT VTI:          0.135 m LVOT/AV VTI ratio: 0.59  AORTA Ao Root diam: 2.80 cm MITRAL VALVE MV Area (PHT): 5.88 cm    SHUNTS MV Area VTI:   2.38 cm    Systemic VTI:  0.14 m MV Peak grad:  5.3 mmHg    Systemic Diam: 2.00 cm MV Mean grad:  1.0 mmHg MV Vmax:       1.15 m/s MV Vmean:      52.3 cm/s MV Decel Time: 129 msec MV E velocity: 36.70 cm/s MV A velocity: 98.00 cm/s MV E/A ratio:  0.37 Lorine Bears MD Electronically signed by Lorine Bears MD Signature Date/Time: 06/12/2022/3:09:46 PM    Final    EEG adult  Result Date: 06/12/2022 Charlsie Quest, MD     06/12/2022  1:07 PM Patient Name: Bertrum Nakano MRN: [id number] Epilepsy Attending: Charlsie Quest Referring Physician/Provider: Jefferson Fuel, MD Date: 06/12/2022 Duration: 27.52 mins Patient history: 57 year old gentleman with a past medical history significant for prior CVA with residual left-sided hemiparesis (right thalamic infarct on June 01, 2021 and 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 He has now been extubated. Patient had persistent encephalopathy. EEG to evaluate for seizure Level of alertness: Awake AEDs during EEG study: None Technical aspects: This EEG study was done with scalp electrodes positioned according to the 10-20 International system of electrode placement. Electrical activity was reviewed with band pass filter of 1-70Hz , sensitivity of 7 uV/mm, display speed of 11mm/sec with a 60Hz  notched filter applied as appropriate. EEG data were recorded continuously and digitally stored.  Video monitoring was available and reviewed as appropriate. Description: The posterior dominant rhythm consists of 9 Hz activity of moderate voltage (25-35 uV) seen predominantly in posterior head regions, symmetric and reactive to eye opening  and eye closing. EEG showed  continuous 3 to 5 Hz theta-delta slowing in right fronto-centro-temporal region. Sharp waves were noted in right fronto-central region.  Hyperventilation and photic stimulation were not performed.   ABNORMALITY - Sharp wave, right fronto-central region. - Continuous slow,  right fronto-centro-temporal region IMPRESSION: This study showed evidence of epileptogenicity arising from right fronto-central region. Additionally there is cortical dysfunction arising from right fronto-centro-temporal region likely secondary to underlying encephalomalacia. No seizures were seen throughout the recording. Charlsie Quest   DG Chest Port 1 View  Result Date: 06/09/2022 CLINICAL DATA:  Acute respiratory failure. EXAM: PORTABLE CHEST 1 VIEW COMPARISON:  06/08/2022 FINDINGS: Endotracheal tube tip is 3.7 cm above the base of the carina. Lung volumes are low. Patchy bibasilar atelectasis or infiltrate is similar to prior. Interstitial markings are diffusely coarsened with chronic features. Cardiopericardial silhouette is at upper limits of normal for size. No substantial pleural effusion. Telemetry leads overlie the chest. IMPRESSION: 1. Endotracheal tube tip is 3.7 cm above the base of the carina. 2. Low volume film with patchy bibasilar atelectasis or infiltrate. Electronically Signed   By: Kennith Center M.D.   On: 06/09/2022 05:15   MR BRAIN WO CONTRAST  Result Date: 06/09/2022 CLINICAL DATA:  Follow-up examination for stroke. EXAM: MRI HEAD WITHOUT CONTRAST TECHNIQUE: Multiplanar, multiecho pulse sequences of the brain and surrounding structures were obtained without intravenous contrast. COMPARISON:  CT from 06/08/2022 as well as prior brain MRI from 06/01/2021. FINDINGS: Brain: Cerebral volume within normal limits. Chronic encephalomalacia of and gliosis with chronic hemosiderin staining seen involving the right basal ganglia/external capsule, likely reflecting changes of prior hemorrhagic infarct. Small remote lacunar  infarct at the right thalamus. Changes of wallerian degeneration extending to the right cerebral peduncle and brainstem. 7 mm focus of mild diffusion abnormality seen involving the right paramedian midbrain (series 5, image 20). Associated T2/FLAIR signal abnormality without visible ADC correlate. This appears separate from the adjacent changes of wallerian degeneration. Finding likely reflects an evolving subacute small vessel infarct. No associated hemorrhage or mass effect. No other evidence for acute or subacute ischemia. Gray-white matter differentiation otherwise maintained. No other acute or chronic intracranial blood products. No mass lesion or mass effect. Ex vacuo dilatation of the right lateral ventricle related to the chronic right cerebral encephalomalacia. No hydrocephalus. No extra-axial fluid collection. Pituitary gland and suprasellar region within normal limits. No other intrinsic temporal lobe abnormality. Vascular: Major intracranial arterial vascular flow voids are maintained. Asymmetric FLAIR hyperintensity without T1 correlate noted involving the right transverse sinus, likely slow/sluggish flow. Skull and upper cervical spine: Craniocervical junction within normal limits. Bone marrow signal intensity normal. No scalp soft tissue abnormality. Sinuses/Orbits: Globes orbital soft tissues within normal limits. Paranasal sinuses are largely clear. No significant mastoid effusion. Other: Endotracheal and enteric tubes in place. IMPRESSION: 1. 7 mm focus of mild diffusion abnormality involving the right paramedian midbrain, likely reflecting an evolving subacute small vessel infarct. No associated hemorrhage or mass effect. 2. Chronic encephalomalacia and gliosis involving the right basal ganglia/external capsule, likely reflecting changes of prior hemorrhagic infarct. Associated wallerian degeneration. 3. Additional small remote lacunar infarct at the right thalamus. Electronically Signed   By:  Rise Mu M.D.   On: 06/09/2022 04:50   CT HEAD WO CONTRAST ( )  Result Date: 06/08/2022 CLINICAL DATA:  Mental status change. EXAM: CT HEAD WITHOUT CONTRAST TECHNIQUE: Contiguous axial images were obtained from the base of the skull through the vertex without intravenous contrast. RADIATION DOSE  REDUCTION: This exam was performed according to the departmental dose-optimization program which includes automated exposure control, adjustment of the mA and/or kV according to patient size and/or use of iterative reconstruction technique. COMPARISON:  June 01, 2021 FINDINGS: Brain: No evidence of acute hemorrhage, hydrocephalus, extra-axial collection or mass lesion/mass effect. Encephalomalacia and brain parenchymal volume loss at the site of prior right thalamus/right basal ganglia infarct. Area of hypodensity extends to the right pons and appears larger than on patient's prior CT. Ex vacuo dilatation of the right lateral ventricle. Vascular: No hyperdense vessel or unexpected calcification. Skull: Normal. Negative for fracture or focal lesion. Sinuses/Orbits: No acute finding. Other: None. IMPRESSION: 1. Encephalomalacia and brain parenchymal volume loss at the site of prior right thalamus/right basal ganglia infarct. 2. Area of hypodensity extends from the previously infarcted parenchyma to the right pons and appears larger than on patient's prior CT. This may represent an area of acute/subacute infarction. Consider further evaluation with MRI. 3. No evidence of acute intracranial hemorrhage. Electronically Signed   By: Ted Mcalpine M.D.   On: 06/08/2022 16:31   DG Chest Portable 1 View  Result Date: 06/08/2022 CLINICAL DATA:  Post intubation and NG tube placement EXAM: PORTABLE CHEST 1 VIEW. Portable limited x-ray of the upper abdomen for tube placement COMPARISON:  Chest x-ray 06/09/2009.  CT scan 06/01/2021 FINDINGS: Chest x-ray: ET tube seen with the tip measured 5.8 cm above the  carina. Underinflation with some interstitial and lung base mild parenchymal opacities. No pneumothorax, effusion or consolidation. Normal cardiopericardial silhouette. Overlapping cardiac leads. Abdominal x-ray: Single limited view of the upper abdomen demonstrates enteric tube with tip of the level of the stomach with side hole of the GE junction. Recommend this be advanced further into the stomach. IMPRESSION: ET tube in place. Enteric tube with side hole at the GE junction. This could be advanced further into the stomach. Bilateral patchy parenchymal interstitial lung changes. Recommend follow-up. Please correlate with prior CT scan Electronically Signed   By: Karen Kays M.D.   On: 06/08/2022 15:57   DG Abdomen 1 View  Result Date: 06/08/2022 CLINICAL DATA:  Post intubation and NG tube placement EXAM: PORTABLE CHEST 1 VIEW. Portable limited x-ray of the upper abdomen for tube placement COMPARISON:  Chest x-ray 06/09/2009.  CT scan 06/01/2021 FINDINGS: Chest x-ray: ET tube seen with the tip measured 5.8 cm above the carina. Underinflation with some interstitial and lung base mild parenchymal opacities. No pneumothorax, effusion or consolidation. Normal cardiopericardial silhouette. Overlapping cardiac leads. Abdominal x-ray: Single limited view of the upper abdomen demonstrates enteric tube with tip of the level of the stomach with side hole of the GE junction. Recommend this be advanced further into the stomach. IMPRESSION: ET tube in place. Enteric tube with side hole at the GE junction. This could be advanced further into the stomach. Bilateral patchy parenchymal interstitial lung changes. Recommend follow-up. Please correlate with prior CT scan Electronically Signed   By: Karen Kays M.D.   On: 06/08/2022 15:57    Microbiology: Results for orders placed or performed during the hospital encounter of 06/08/22  MRSA Next Gen by PCR, Nasal     Status: None   Collection Time: 06/08/22  8:32 PM    Specimen: Nasal Mucosa; Nasal Swab  Result Value Ref Range Status   MRSA by PCR Next Gen NOT DETECTED NOT DETECTED Final    Comment: (NOTE) The GeneXpert MRSA Assay (FDA approved for NASAL specimens only), is one component of a comprehensive  MRSA colonization surveillance program. It is not intended to diagnose MRSA infection nor to guide or monitor treatment for MRSA infections. Test performance is not FDA approved in patients less than 43 years old. Performed at [facility], [address] [place], [place] [address]     Labs: CBC: Recent Labs  Lab 06/13/22 0421  WBC 6.2  HGB 14.1  HCT 39.9  MCV 86.7  PLT 208   Basic Metabolic Panel: Recent Labs  Lab 06/13/22 0421  NA 136  K 3.8  CL 105  CO2 23  GLUCOSE 133*  BUN 7  CREATININE 0.65  CALCIUM 8.9  PHOS 3.9   Liver Function Tests: Recent Labs  Lab 06/13/22 0421  ALBUMIN 3.1*   CBG: Recent Labs  Lab 06/17/22 1146 06/17/22 1640 06/17/22 2005 06/18/22 0010 06/18/22 0406  GLUCAP 146* 113* 140* 112* 106*    Discharge time spent: greater than 30 minutes.  Signed: Enedina Finner, MD [facility] 06/19/2022

## 2022-06-19 NOTE — Discharge Instructions (Signed)
Diet recommendations: Dysphagia 1 (puree);Nectar-thick liquid Liquids provided via: Teaspoon;Cup;Straw (monitor) Medication Administration: Crushed with puree Supervision: Staff to assist with self feeding;Full supervision/cueing for compensatory strategies Compensations: Minimize environmental distractions;Slow rate;Small sips/bites;Lingual sweep for clearance of pocketing;Follow solids with liquid

## 2022-06-19 NOTE — TOC Progression Note (Signed)
Transition of Care [facility]) - Progression Note    Patient Details  Name: Patrick Perez MRN: [id number] Date of Birth: [birth year]  [facility] CM/SW Contact  Marlowe Sax, RN Phone Number: 06/19/2022, 9:30 AM  Clinical Narrative:   The patient will DC home today with his Girlfriend, he will follow up with [facility] as he has used before He endorses having all Dme that he needs     Expected Discharge Plan:  (TBD) Barriers to Discharge: Psych Bed not available  Expected Discharge Plan and Services   Discharge Planning Services: CM Consult   Living arrangements for the past 2 months: Single Family Home                 DME Arranged:  (tbd)                     Social Determinants of Health (SDOH) Interventions SDOH Screenings   Food Insecurity: No Food Insecurity (08/29/2017)  Transportation Needs: No Transportation Needs (08/29/2017)  Depression (PHQ2-9): Medium Risk (07/07/2021)  Financial Resource Strain: Low Risk  (08/29/2017)  Physical Activity: Inactive (08/29/2017)  Social Connections: Unknown (08/29/2017)  Stress: No Stress Concern Present (08/29/2017)  Tobacco Use: Medium Risk (06/18/2022)    Readmission Risk Interventions     No data to display

## 2022-06-19 NOTE — Plan of Care (Signed)
  Problem: Education: Goal: Ability to describe self-care measures that may prevent or decrease complications (Diabetes Survival Skills Education) will improve Outcome: Progressing Goal: Individualized Educational Video(s) Outcome: Progressing   Problem: Coping: Goal: Ability to adjust to condition or change in health will improve Outcome: Progressing   Problem: Fluid Volume: Goal: Ability to maintain a balanced intake and output will improve Outcome: Progressing   Problem: Health Behavior/Discharge Planning: Goal: Ability to identify and utilize available resources and services will improve Outcome: Progressing Goal: Ability to manage health-related needs will improve Outcome: Progressing   Problem: Metabolic: Goal: Ability to maintain appropriate glucose levels will improve Outcome: Progressing   Problem: Nutritional: Goal: Maintenance of adequate nutrition will improve Outcome: Progressing Goal: Progress toward achieving an optimal weight will improve Outcome: Progressing   Problem: Skin Integrity: Goal: Risk for impaired skin integrity will decrease Outcome: Progressing   Problem: Tissue Perfusion: Goal: Adequacy of tissue perfusion will improve Outcome: Progressing   Problem: Education: Goal: Knowledge of General Education information will improve Description: Including pain rating scale, medication(s)/side effects and non-pharmacologic comfort measures Outcome: Progressing   Problem: Health Behavior/Discharge Planning: Goal: Ability to manage health-related needs will improve Outcome: Progressing   Problem: Clinical Measurements: Goal: Ability to maintain clinical measurements within normal limits will improve Outcome: Progressing Goal: Will remain free from infection Outcome: Progressing Goal: Diagnostic test results will improve Outcome: Progressing Goal: Respiratory complications will improve Outcome: Progressing Goal: Cardiovascular complication will  be avoided Outcome: Progressing   Problem: Activity: Goal: Risk for activity intolerance will decrease Outcome: Progressing   Problem: Nutrition: Goal: Adequate nutrition will be maintained Outcome: Progressing   Problem: Coping: Goal: Level of anxiety will decrease Outcome: Progressing   Problem: Elimination: Goal: Will not experience complications related to bowel motility Outcome: Progressing Goal: Will not experience complications related to urinary retention Outcome: Progressing   Problem: Pain Managment: Goal: General experience of comfort will improve Outcome: Progressing   Problem: Safety: Goal: Ability to remain free from injury will improve Outcome: Progressing   Problem: Skin Integrity: Goal: Risk for impaired skin integrity will decrease Outcome: Progressing   Problem: Health Behavior/Discharge Planning: Goal: Ability to manage health-related needs will improve Outcome: Progressing   Problem: Clinical Measurements: Goal: Will remain free from infection Outcome: Progressing   Problem: Nutrition: Goal: Adequate nutrition will be maintained Outcome: Progressing   Problem: Coping: Goal: Level of anxiety will decrease Outcome: Progressing

## 2022-06-19 NOTE — Progress Notes (Signed)
   06/19/22 0955  What Happened  Was fall witnessed? No  Was patient injured? No  Patient found on floor  Found by Staff-comment  Stated prior activity to/from bed, chair, or stretcher  Provider Notification  Provider Name/Title Patel MD  Date Provider Notified 06/19/22  Time Provider Notified 1030  Method of Notification Page  Notification Reason Fall  Provider response No new orders  Date of Provider Response 06/19/22  Time of Provider Response 1035  Follow Up  Family notified Yes - comment  Time family notified 1015  Additional tests No  Progress note created (see row info) Yes  Adult Fall Risk Assessment  Risk Factor Category (scoring not indicated) High fall risk per protocol (document High fall risk)  Patient Fall Risk Level High fall risk  Adult Fall Risk Interventions  Required Bundle Interventions *See Row Information* High fall risk - low, moderate, and high requirements implemented  Additional Interventions Use of appropriate toileting equipment (bedpan, BSC, etc.)  Fall intervention(s) refused/Patient educated regarding refusal Open door if unsupervised  Screening for Fall Injury Risk (To be completed on HIGH fall risk patients) - Assessing Need for Floor Mats  Risk For Fall Injury- Criteria for Floor Mats Noncompliant with safety precautions  Will Implement Floor Mats Yes  Vitals  Temp 98.2 F (36.8 C)  Temp Source Oral  BP (!) 127/95  MAP (mmHg) 106  BP Location Left Arm  BP Method Automatic  Patient Position (if appropriate) Sitting  Resp 18  Oxygen Therapy  SpO2 100 %  O2 Device Room Air  Pain Assessment  Pain Scale 0-10  Pain Score 2   No new orders. No new injuries.

## 2022-06-22 NOTE — Unmapped (Unsigned)
[facility] Primary Care at [facility] Note:  Paul Hurst, [place] [address]. Phone [phone]    06/22/2022    Patient Name:   Paul Stelling Sr.    MRN: [id number]    Demographics:    Age-  57 y.o.     Date of Birth-  [birth year]    Chief complaint (CC):  No chief complaint on file.      Assessment/Plan:    Paul Hurst is a 57 y.o. male with PMHx of CVA (right thalamic infarct 06/01/21 and right basal ganglio hemorrhage 06/08/21) with left-sided hemiparesis, dysphagia, rheumatoid arthritis, T2DM, HTN, RLS, PTSD, chronic pain, Moderate depressive disorder, Alcohol use disorder. Here for hospital follow up (Intentional drug overdose), recommendations as below.     Reviewed discharge summary, imaging, and lab results from hospitalization 4/25 -> 06/19/22. Refer to EMR for full summation of events.          Diagnosis ICD-10-CM Associated Orders   1. Hospital discharge follow-up  Z09       2. Drug overdose, multiple drugs, intentional self-harm, sequela (CMS-HCC)  T50.912S       3. History of stroke  Z86.73         30 minutes of clinical time > 1/2 the office visit was face to face time. We discussed medical, dietary, lifestyle, and health maintenance modifications to optimize health. Standard precautions followed during visit. Medication adherence and barriers to the treatment plan have been addressed.  Patient voiced understanding, needs F/U visit as scheduled with PCP 08/07/22, sooner prn.    Health Maintenance:   Health Maintenance Due   Topic Date Due    Retinal Eye Exam  Never done    Colon Cancer Screening  Never done    Zoster Vaccines (2 of 2) 03/23/2022         PHQ-2 Score:      PHQ-9 Score:      Inocente Salles Score:      {select_status_or_delete_smartlist:64641}    Subjective:    History of present illness (HPI):       Paul Muraoka Sr. is a 57 y.o. male who presented to [facility] for hospital follow up.     Patient presented to the [facility] ED 06/08/22 due to intentional drug overdose (amitriptyline and Flexeril). According to patient's significant other, Paul Hurst had called herself and patient's mother to report he had intentionally taken too many medications earlier that AM. MRI brain 06/09/22 demonstrated 7 mm focus of diffuse abnormality involving the right paramedian midbrain likely reflecting evolving subacute small vessel infarct, patient was subsequently initiated on Plavix by Neuro. Echo 4/29 with normal EF. EEG without evidence of left echogenicity arising from the right frontal central region secondary to encephalomalacia and Neuro declined to start antiseizure medications. CT angiogram 4/30 w/ occlusion of right vertebral artery, began working with Physical Therapy. Patient became agitated and restless 5/1, desired to leave. Psych consulted, recommended prn Ativan and Haldol. Started on Seroquel. IVC discontinued 5/3 however patient continued to display pattern of agitation throughout rest of his hospitalization. Patient declined outpatient rehab facility on discharge, was advised to continue close psych follow up [facility]).    Today patient reports ***    Patient's medical history was reviewed, see Past Medical History. Medications used in the past were reviewed, see medications. Patient reports eating and eliminating well. Pt is sleeping well. Patient has required emergency room treatment for these symptoms, and has required hospitalization.     Denies HA, fever, chest pain,  shortness of breath, N/V/D, bowel or bladder issues, vision or hearing changes, or swelling.     Relevant ROS: Reviewed 12 systems, positive findings listed, all others negative.    Pertinent Past Med Hx:    Past Medical History:   Diagnosis Date    Diabetes mellitus (CMS-HCC)     Rheumatoid arthritis(714.0)     Stroke (CMS-HCC)        Medications:       Current Outpatient Medications:     acetaminophen (TYLENOL) 500 MG tablet, Take 1 tablet (500 mg total) by mouth every six (6) hours as needed for pain., Disp: , Rfl:     amitriptyline (ELAVIL) 25 MG tablet, Take 1 tablet (25 mg total) by mouth nightly., Disp: 90 tablet, Rfl: 3    aspirin 81 MG chewable tablet, Chew 1 tablet (81 mg total) daily., Disp: 30 tablet, Rfl: 3    atorvastatin (LIPITOR) 80 MG tablet, Take 1 tablet (80 mg total) by mouth every evening., Disp: 180 tablet, Rfl: 0    blood sugar diagnostic (ONETOUCH ULTRA TEST) Strp, by Other route daily before breakfast., Disp: 100 strip, Rfl: 1    carvedilol (COREG) 6.25 MG tablet, Take 1 tablet (6.25 mg total) by mouth two (2) times a day., Disp: 180 tablet, Rfl: 0    cholecalciferol, vitamin D3-10 mcg, 400 unit,, 10 mcg (400 unit) Tab, Take 5 tablets (50 mcg total) by mouth daily., Disp: , Rfl:     clopidogrel (PLAVIX) 75 mg tablet, Take 1 tablet (75 mg total) by mouth daily., Disp: 90 tablet, Rfl: 0    cyanocobalamin/folic acid (VITAMIN B12-FOLIC ACID) 1,000-400 mcg Lozg, Take 1,000 mcg by mouth daily with evening meal., Disp: , Rfl:     cyclobenzaprine (FLEXERIL) 10 MG tablet, Take 1 tablet (10 mg total) by mouth Three (3) times a day as needed., Disp: 90 tablet, Rfl: 1    empty container Misc, USE AS DIRECTED, Disp: 1 each, Rfl: 2    folic acid (FOLVITE) 1 MG tablet, Take 1 tablet (1 mg total) by mouth daily. (Patient taking differently: Take 800 mcg by mouth daily.), Disp: 30 tablet, Rfl: 6    gabapentin (NEURONTIN) 300 MG capsule, TAKE 1 CAPSULE BY MOUTH THREE TIMES A DAY., Disp: 90 capsule, Rfl: 3    golimumab (SIMPONI) 50 mg/0.5 mL PnIj pen injector, Inject the contents of 1 pen (50 mg total) under the skin every twenty-eight (28) days., Disp: 3 mL, Rfl: 3    ibuprofen (ADVIL) 200 MG tablet, Take 1 tablet (200 mg total) by mouth every six (6) hours as needed for pain., Disp: , Rfl:     melatonin 10 mg Chew, Chew 10 mg nightly., Disp: , Rfl:     metFORMIN (GLUCOPHAGE) 500 MG tablet, Take 1 tablet (500 mg total) by mouth in the morning and 1 tablet (500 mg total) in the evening. Take with meals., Disp: 180 tablet, Rfl: 1 methotrexate, Preservative Free, 25 mg/mL Soln vial/syringe, INJECT 1 ML (25 MG TOTAL) UNDER THE SKIN ONCE A WEEK., Disp: 6 mL, Rfl: 3    nitroglycerin (NITROSTAT) 0.4 MG SL tablet, Place 1 tablet (0.4 mg total) under the tongue every five (5) minutes as needed for chest pain. Maximum of 3 doses in 15 minutes., Disp: 25 tablet, Rfl: 0    ondansetron (ZOFRAN) 4 MG tablet, Take 1 tablet (4 mg total) by mouth every twelve (12) hours as needed for nausea., Disp: , Rfl:     syringe with needle 1  mL 25 gauge x 5/8 Syrg, For use with injectable methotrexate, Disp: 50 each, Rfl: 0    traMADoL (ULTRAM) 50 mg tablet, Take 1 tablet (50 mg total) by mouth every twelve (12) hours as needed for pain., Disp: 60 tablet, Rfl: 3     Allergies:   Allergies   Allergen Reactions    Seroquel [Quetiapine] Rash       Pertinent Social Hx and Habits: EMR reviewed    Pertinent Family Hx: EMR reviewed    Objective:      BP Readings from Last 3 Encounters:   05/19/22 120/99   05/16/22 104/77   05/13/22 118/84        There were no vitals filed for this visit.     Physical Exam:    General Appearance: WDWN in NAD      Skin: W, D, I  HEENT: PERRLA, EOMI, TM's clear  Respiratory: Clear throughout  Cardio: RRR  Abdomen: Soft and non-tnder  Neurologic: A & O X 4, Grossly intact, stable gait  PSYCH: Behavior calm and cooperative    Diagnostics:     Lab Results   Component Value Date    WBC 8.5 05/19/2022    HGB 16.1 05/19/2022    HCT 46.8 05/19/2022    PLT 233 05/19/2022       Lab Results   Component Value Date    NA 135 05/19/2022    K 4.4 05/19/2022    CL 104 05/19/2022    CO2 27.2 05/19/2022    BUN 7 (L) 05/19/2022    CREATININE 0.66 (L) 05/19/2022    GLU 107 05/19/2022    CALCIUM 9.8 05/19/2022    MG 1.8 08/21/2021    PHOS 4.5 06/18/2021       Lab Results   Component Value Date    BILITOT 1.2 05/19/2022    PROT 7.8 05/19/2022    ALBUMIN 3.9 05/19/2022    ALT 9 (L) 05/19/2022    AST 17 05/19/2022    ALKPHOS 132 (H) 05/19/2022       Lab Results Component Value Date    PT 12.2 05/13/2022    INR 1.10 05/13/2022    APTT 30.2 06/12/2021        TSH   Date Value Ref Range Status   05/13/2022 1.305 0.550 - 4.780 uIU/mL Final   06/09/2021 1.993 0.550 - 4.780 uIU/mL Final        I attest that I, Benita Stabile, personally documented this note while acting as scribe for Olena Leatherwood, NP.      Benita Stabile, Scribe.  06/22/2022     The documentation recorded by the scribe accurately reflects the service I personally performed and the decisions made by me.     Olena Leatherwood, NP    Dr. Betha Loa, DNP, FNP-BC  [facility] Primary Care at [facility] Certified Doctor of Nursing Practice   [phone]

## 2022-06-23 ENCOUNTER — Emergency Department
Admission: EM | Admit: 2022-06-23 | Discharge: 2022-07-05 | Disposition: A | Discharge status: Home / Self Care | Payer: Medicare Other | Attending: Emergency Medicine | Admitting: Emergency Medicine

## 2022-06-23 DIAGNOSIS — F1290 Cannabis use, unspecified, uncomplicated: Secondary | ICD-10-CM | POA: Diagnosis not present

## 2022-06-23 DIAGNOSIS — F4310 Post-traumatic stress disorder, unspecified: Secondary | ICD-10-CM | POA: Diagnosis not present

## 2022-06-23 DIAGNOSIS — I69998 Other sequelae following unspecified cerebrovascular disease: Secondary | ICD-10-CM

## 2022-06-23 DIAGNOSIS — Z7984 Long term (current) use of oral hypoglycemic drugs: Secondary | ICD-10-CM | POA: Insufficient documentation

## 2022-06-23 DIAGNOSIS — R519 Headache, unspecified: Secondary | ICD-10-CM | POA: Diagnosis not present

## 2022-06-23 DIAGNOSIS — R531 Weakness: Secondary | ICD-10-CM | POA: Diagnosis not present

## 2022-06-23 DIAGNOSIS — R29818 Other symptoms and signs involving the nervous system: Secondary | ICD-10-CM | POA: Diagnosis not present

## 2022-06-23 DIAGNOSIS — Z79899 Other long term (current) drug therapy: Secondary | ICD-10-CM | POA: Diagnosis not present

## 2022-06-23 DIAGNOSIS — Z043 Encounter for examination and observation following other accident: Secondary | ICD-10-CM | POA: Diagnosis not present

## 2022-06-23 DIAGNOSIS — E119 Type 2 diabetes mellitus without complications: Secondary | ICD-10-CM | POA: Diagnosis not present

## 2022-06-23 DIAGNOSIS — Y900 Blood alcohol level of less than 20 mg/100 ml: Secondary | ICD-10-CM | POA: Diagnosis not present

## 2022-06-23 DIAGNOSIS — R45851 Suicidal ideations: Secondary | ICD-10-CM | POA: Insufficient documentation

## 2022-06-23 DIAGNOSIS — F4320 Adjustment disorder, unspecified: Secondary | ICD-10-CM | POA: Insufficient documentation

## 2022-06-23 DIAGNOSIS — F1010 Alcohol abuse, uncomplicated: Secondary | ICD-10-CM | POA: Insufficient documentation

## 2022-06-23 DIAGNOSIS — Z8673 Personal history of transient ischemic attack (TIA), and cerebral infarction without residual deficits: Secondary | ICD-10-CM | POA: Insufficient documentation

## 2022-06-23 LAB — COMPREHENSIVE METABOLIC PANEL
ALT: 32 U/L (ref 0–44)
AST: 24 U/L (ref 15–41)
Albumin: 4.4 g/dL (ref 3.5–5.0)
Alkaline Phosphatase: 149 U/L — ABNORMAL HIGH (ref 38–126)
Anion gap: 12 (ref 5–15)
BUN: 8 mg/dL (ref 6–20)
CO2: 26 mmol/L (ref 22–32)
Calcium: 9.6 mg/dL (ref 8.9–10.3)
Chloride: 101 mmol/L (ref 98–111)
Creatinine, Ser: 0.75 mg/dL (ref 0.61–1.24)
GFR, Estimated: >60 mL/min (ref >60)
Glucose, Bld: 100 mg/dL — ABNORMAL HIGH (ref 70–99)
Potassium: 4.3 mmol/L (ref 3.5–5.1)
Sodium: 139 mmol/L (ref 135–145)
Total Bilirubin: 0.6 mg/dL (ref 0.3–1.2)
Total Protein: 8.4 g/dL — ABNORMAL HIGH (ref 6.5–8.1)

## 2022-06-23 LAB — URINE DRUG SCREEN, QUALITATIVE (ARMC ONLY)
Amphetamines, Ur Screen: NOT DETECTED
Barbiturates, Ur Screen: NOT DETECTED
Benzodiazepine, Ur Scrn: POSITIVE — AB
Cannabinoid 50 Ng, Ur ~~LOC~~: POSITIVE — AB
Cocaine Metabolite,Ur ~~LOC~~: NOT DETECTED
MDMA (Ecstasy)Ur Screen: NOT DETECTED
Methadone Scn, Ur: NOT DETECTED
Opiate, Ur Screen: NOT DETECTED
Phencyclidine (PCP) Ur S: NOT DETECTED
Tricyclic, Ur Screen: POSITIVE — AB

## 2022-06-23 LAB — CBC
HCT: 43 % (ref 39.0–52.0)
Hemoglobin: 14.9 g/dL (ref 13.0–17.0)
MCH: 30.4 pg (ref 26.0–34.0)
MCHC: 34.7 g/dL (ref 30.0–36.0)
MCV: 87.8 fL (ref 80.0–100.0)
Platelets: 353 10*3/uL (ref 150–400)
RBC: 4.9 MIL/uL (ref 4.22–5.81)
RDW: 12.2 % (ref 11.5–15.5)
WBC: 8.5 10*3/uL (ref 4.0–10.5)
nRBC: 0 % (ref 0.0–0.2)

## 2022-06-23 LAB — ACETAMINOPHEN LEVEL: Acetaminophen (Tylenol), Serum: <10 ug/mL — ABNORMAL LOW (ref 10–30)

## 2022-06-23 LAB — SALICYLATE LEVEL: Salicylate Lvl: <7 mg/dL — ABNORMAL LOW (ref 7.0–30.0)

## 2022-06-23 LAB — CBG MONITORING, ED: Glucose-Capillary: 83 mg/dL (ref 70–99)

## 2022-06-23 LAB — ETHANOL: Alcohol, Ethyl (B): <10 mg/dL (ref <10)

## 2022-06-23 MED ORDER — HALOPERIDOL 5 MG PO TABS: 5.0000 mg | ORAL_TABLET | Freq: Three times a day (TID) | ORAL | Status: DC | PRN

## 2022-06-23 MED ORDER — TRAZODONE HCL 50 MG PO TABS
50.0000 mg | ORAL_TABLET | Freq: Every day | ORAL | Status: DC
  Administered 2022-06-24 – 2022-07-04 (×12): 50 mg via ORAL
  Filled 2022-06-23 (×12): qty 1

## 2022-06-23 MED ORDER — LORAZEPAM 2 MG/ML IJ SOLN: 2.0000 mg | Freq: Three times a day (TID) | INTRAMUSCULAR | Status: DC | PRN

## 2022-06-23 MED ORDER — SERTRALINE HCL 50 MG PO TABS
50.0000 mg | ORAL_TABLET | Freq: Every day | ORAL | Status: DC
  Administered 2022-06-24 – 2022-06-26 (×3): 50 mg via ORAL
  Filled 2022-06-23 (×3): qty 1

## 2022-06-23 MED ORDER — DIPHENHYDRAMINE HCL 25 MG PO CAPS: 50.0000 mg | ORAL_CAPSULE | Freq: Three times a day (TID) | ORAL | Status: DC | PRN

## 2022-06-23 MED ORDER — DIPHENHYDRAMINE HCL 50 MG/ML IJ SOLN: 50.0000 mg | Freq: Four times a day (QID) | INTRAMUSCULAR | Status: DC | PRN

## 2022-06-23 MED ORDER — LOPERAMIDE HCL 2 MG PO CAPS
2.0000 mg | ORAL_CAPSULE | Freq: Two times a day (BID) | ORAL | Status: DC | PRN
  Administered 2022-06-23: 2 mg via ORAL
  Filled 2022-06-23: qty 1

## 2022-06-23 MED ORDER — LORAZEPAM 2 MG PO TABS: 2.0000 mg | ORAL_TABLET | Freq: Three times a day (TID) | ORAL | Status: DC | PRN

## 2022-06-23 MED ORDER — HALOPERIDOL LACTATE 5 MG/ML IJ SOLN: 5.0000 mg | Freq: Three times a day (TID) | INTRAMUSCULAR | Status: DC | PRN

## 2022-06-23 MED ORDER — HYDROXYZINE HCL 25 MG PO TABS
50.0000 mg | ORAL_TABLET | Freq: Three times a day (TID) | ORAL | Status: DC | PRN
  Administered 2022-06-25: 50 mg via ORAL
  Filled 2022-06-23: qty 2

## 2022-06-23 NOTE — ED Triage Notes (Addendum)
Pt arrives with [place] [employer] with IVC in hand. Per IVC papers, pt has been IVC'd due to swinging objects at other and the want to kill himself. Per Burna Mortimer the petitioner, the pt caretaker [name] pt caretaker and girlfriend heard pt that he wanted to kill other and himself as well as pt has not been taking care of himself as in hygiene and eating food. Pt completely denies all of this. Pt sts that tonya is taking things from him and sealing his money and attempting to take over all of his stuff.

## 2022-06-23 NOTE — ED Notes (Signed)
Pt reports he has had diarrhea, last episode was this morning around 0400, states he does not want to eat because he is afraid he will have diarrhea again. Reports he has not taking his metformin today and requested his CBG to be checked.

## 2022-06-23 NOTE — ED Provider Notes (Signed)
[facility] Provider Note    Event Date/Time   First MD Initiated Contact with Patient 06/23/22 2155     (approximate)   History   Suicidal   HPI  Patrick Perez is a 57 y.o. male   Past medical history of prior strokes, diabetes, anxiety depression who presents emergency department under IVC for suicidality and threatening to harm others.  Reportedly from IVC paperwork he has made suicidal statements and has been swinging objects at other people threatening to hurt them.  He denies all these allegations.  He states that is a misunderstanding the person who put him under IVC is doing so for other  motives.  Denies suicidality homicidality or hallucinations and has been compliant with all medications.  He denies ingestions or self-harm, no alcohol or drug use.  No other acute medical complaints.   External Medical Documents Reviewed: IVC paperwork      Physical Exam   Triage Vital Signs: ED Triage Vitals  Enc Vitals Group     BP 06/23/22 2018 (!) 125/98     Pulse Rate 06/23/22 2018 72     Resp 06/23/22 2018 17     Temp 06/23/22 2018 98 F (36.7 C)     Temp Source 06/23/22 2018 Oral     SpO2 06/23/22 2018 98 %     Weight 06/23/22 2019 182 lb (82.6 kg)     Height --      Head Circumference --      Peak Flow --      Pain Score 06/23/22 2019 0     Pain Loc --      Pain Edu? --      Excl. in GC? --     Most recent vital signs: Vitals:   06/23/22 2018  BP: (!) 125/98  Pulse: 72  Resp: 17  Temp: 98 F (36.7 C)  SpO2: 98%    General: Awake, no distress.  CV:  Good peripheral perfusion.  Resp:  Normal effort.  Abd:  No distention.  Other:  Dysarthria, left-sided motor weakness which patient states is prior stroke related.  Otherwise atraumatic appears well vital signs are reviewed and normal   ED Results / Procedures / Treatments   Labs (all labs ordered are listed, but only abnormal results are displayed) Labs Reviewed   COMPREHENSIVE METABOLIC PANEL - Abnormal; Notable for the following components:      Result Value   Glucose, Bld 100 (*)    Total Protein 8.4 (*)    Alkaline Phosphatase 149 (*)    All other components within normal limits  SALICYLATE LEVEL - Abnormal; Notable for the following components:   Salicylate Lvl <7.0 (*)    All other components within normal limits  ACETAMINOPHEN LEVEL - Abnormal; Notable for the following components:   Acetaminophen (Tylenol), Serum <10 (*)    All other components within normal limits  URINE DRUG SCREEN, QUALITATIVE ([facility] ONLY) - Abnormal; Notable for the following components:   Tricyclic, Ur Screen POSITIVE (*)    Cannabinoid 50 Ng, Ur [place] POSITIVE (*)    Benzodiazepine, Ur Scrn POSITIVE (*)    All other components within normal limits  ETHANOL  CBC  CBG MONITORING, ED     I ordered and reviewed the above labs they are notable for white blood cell count is normal and salicylate and acetaminophen levels are negative.  He has positive benzodiazepines, cannabinoids and tricyclics in his urine drug test  PROCEDURES:  Critical  Care performed: No  Procedures   MEDICATIONS ORDERED IN ED: Medications  loperamide (IMODIUM) capsule 2 mg (2 mg Oral Given 06/23/22 2249)    IMPRESSION / MDM / ASSESSMENT AND PLAN / ED COURSE  I reviewed the triage vital signs and the nursing notes.                                Patient's presentation is most consistent with acute presentation with potential threat to life or bodily function.  Differential diagnosis includes, but is not limited to, suicidality, homicidality, decompensated psychiatric illness   The patient is on the cardiac monitor to evaluate for evidence of arrhythmia and/or significant heart rate changes.  MDM: This patient under IVC brought to the emergency department for psychiatric evaluation given suicidality and threatening to harm others.  He denies all of these allegations.  He reports no  other acute medical complaints except for some mild loose stools and is requesting loperamide which I have offered him.  Basic labs and toxicologic labs reviewed, cleared for psychiatric evaluation.        FINAL CLINICAL IMPRESSION(S) / ED DIAGNOSES   Final diagnoses:  Suicidal ideation     Rx / DC Orders   ED Discharge Orders     None        Note:  This document was prepared using Dragon voice recognition software and may include unintentional dictation errors.    Pilar Jarvis, MD 06/23/22 2250

## 2022-06-24 DIAGNOSIS — F4310 Post-traumatic stress disorder, unspecified: Secondary | ICD-10-CM | POA: Insufficient documentation

## 2022-06-24 LAB — CBG MONITORING, ED: Glucose-Capillary: 127 mg/dL — ABNORMAL HIGH (ref 70–99)

## 2022-06-24 MED ORDER — THIAMINE MONONITRATE 100 MG PO TABS
100.0000 mg | ORAL_TABLET | Freq: Every day | ORAL | Status: DC
  Administered 2022-06-24 – 2022-07-05 (×12): 100 mg via ORAL
  Filled 2022-06-24 (×13): qty 1

## 2022-06-24 MED ORDER — LORAZEPAM 2 MG/ML IJ SOLN: 1.0000 mg | INTRAMUSCULAR | Status: AC | PRN

## 2022-06-24 MED ORDER — OLANZAPINE 5 MG PO TBDP: 10.0000 mg | ORAL_TABLET | Freq: Three times a day (TID) | ORAL | Status: DC | PRN

## 2022-06-24 MED ORDER — FOLIC ACID 1 MG PO TABS
1.0000 mg | ORAL_TABLET | Freq: Every day | ORAL | Status: DC
  Administered 2022-06-24 – 2022-07-05 (×12): 1 mg via ORAL
  Filled 2022-06-24 (×12): qty 1

## 2022-06-24 MED ORDER — CARVEDILOL 6.25 MG PO TABS
6.2500 mg | ORAL_TABLET | Freq: Once | ORAL | Status: AC
  Administered 2022-06-24: 6.25 mg via ORAL
  Filled 2022-06-24: qty 1

## 2022-06-24 MED ORDER — ZIPRASIDONE MESYLATE 20 MG IM SOLR: 20.0000 mg | INTRAMUSCULAR | Status: DC | PRN

## 2022-06-24 MED ORDER — THIAMINE HCL 100 MG/ML IJ SOLN: 100.0000 mg | Freq: Every day | INTRAMUSCULAR | Status: DC

## 2022-06-24 MED ORDER — ADULT MULTIVITAMIN W/MINERALS CH
1.0000 | ORAL_TABLET | Freq: Every day | ORAL | Status: DC
  Administered 2022-06-25 – 2022-07-05 (×11): 1 via ORAL
  Filled 2022-06-24 (×11): qty 1

## 2022-06-24 MED ORDER — LORAZEPAM 1 MG PO TABS: 1.0000 mg | ORAL_TABLET | ORAL | Status: AC | PRN

## 2022-06-24 MED ORDER — LORAZEPAM 1 MG PO TABS
1.0000 mg | ORAL_TABLET | ORAL | Status: AC | PRN
  Administered 2022-06-24: 1 mg via ORAL
  Filled 2022-06-24: qty 1

## 2022-06-24 NOTE — ED Notes (Signed)
MD notified of pts HR. Pt was encouraged to take carvedilol, pt refused stating he wants to speak to a doctor, we are violating his rights... MD notified that pt was unwilling to take medication

## 2022-06-24 NOTE — ED Notes (Signed)
Pt had requested blood sugar to be checked
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
## 2022-06-24 NOTE — ED Notes (Signed)
Intake from [facility] called to get report on this patient. Saying that they would be calling back later in the morning.

## 2022-06-24 NOTE — ED Notes (Signed)
Pt up and out of bed because he wanted to get this EDT's attention. Pt endorsed feelings on anxiety, feeling like "the walls are closing in" and that he "can't be like this".  Pt denied wanting anything to help with the anxious feeling and verbalized that the milk helped soothe is stomach earlier after taking PO meds. Pt has a difficult time articulating his words due to his PMH, but is able to verbalize he would like to stand in his doorway so he does not feel so "enclosed" in his room.  Pt allowed to stand in his doorway and made aware to keep quiet due to other pts nearby that are still sleeping. Pt nodded his head and stated "Okay".

## 2022-06-24 NOTE — ED Notes (Signed)
Patient with visitor, no signs of distress, will continue to monitor.

## 2022-06-24 NOTE — ED Notes (Signed)
Pt offered breakfast. Pt didn't want breakfast.

## 2022-06-24 NOTE — ED Notes (Addendum)
Pt seen standing in doorway to his room waving his arm at this EDT to get her attention. Pt requested to have his BP taken.   EDT arrived to pts side and obtained a complete set of VS. Pt appeared tachycardic as VS were being obtained. Pt endorsed being anxious, having feelings of being claustrophobic, and restless. RN notified of findings.

## 2022-06-24 NOTE — ED Notes (Signed)
Pt requested some milk so he could have something on his stomach after taking his medicine. Pt expressed discomfort in his stomach after taking PO meds due to not having anything to eat and/or drink other than water.   Pt provided carton of milk opened by the EDT, encouraged to sit up, and independently drank the entire carton of milk in EDT's presence. Trash disposed of properly. Pt expressed appreciation for the milk and denied needing anything else at this time.

## 2022-06-24 NOTE — ED Notes (Signed)
pt recieved snack and drink 

## 2022-06-24 NOTE — ED Notes (Signed)
Patient is awake, Patient with severe aphasia, trying to tell nurse things and cannot understand, nurse gave him paper and pen and He did write that He was feeling as His rights were being violated here and that He was too stuffy in room, feeling claustrophobic, and that He did not want antianxiety medications because that is not the answer, talked about His PTSD from being in the [employer]. Patient hopes to leave soon .

## 2022-06-24 NOTE — ED Notes (Signed)
Patient is

## 2022-06-24 NOTE — ED Provider Notes (Signed)
Emergency Medicine Observation Re-evaluation Note  Patrick Perez is a 57 y.o. male, seen on rounds today.  Pt initially presented to the ED for complaints of Suicidal Currently, the patient is resting, voices no medical complaints.  Physical Exam  BP (!) 125/98 (BP Location: Left Arm)   Pulse 72   Temp 98 F (36.7 C) (Oral)   Resp 17   Wt 82.6 kg   SpO2 98%   BMI 28.51 kg/m  Physical Exam General: Resting in no acute distress Cardiac: No cyanosis Lungs: Equal rise and fall Psych: Not agitated  ED Course / MDM  EKG:   I have reviewed the labs performed to date as well as medications administered while in observation.  Recent changes in the last 24 hours include no events overnight.  Pharmacy tech has not yet reconciled his medications.  Will enter them in the computer when they have been reconciled.  Plan  Current plan is for psychiatric disposition.    Irean Hong, MD 06/24/22 [phone]

## 2022-06-24 NOTE — BH Assessment (Signed)
Per [facility]), patient to be referred out of system.  Referral information for Psychiatric Hospitalization faxed to;   [facility] [phone]) No available beds  [facility] [phone]),   [facility] [phone] -[phone]) [phone]  [facility] [phone]),  [address] [phone]),   [facility] [phone] -or- [phone]),   [facility] [phone])  [place] [phone] or [phone]),   [facility] [phone])

## 2022-06-24 NOTE — ED Notes (Signed)
Dinner tray provided for pt. Pt sleeping at this time. Tray left at bedside. 
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
## 2022-06-25 ENCOUNTER — Emergency Department: Payer: Medicare Other

## 2022-06-25 DIAGNOSIS — Z043 Encounter for examination and observation following other accident: Secondary | ICD-10-CM | POA: Diagnosis not present

## 2022-06-25 MED ORDER — LIDOCAINE 5 % EX PTCH
1.0000 | MEDICATED_PATCH | CUTANEOUS | Status: DC
  Administered 2022-06-25 – 2022-07-04 (×10): 1 via TRANSDERMAL
  Filled 2022-06-25 (×10): qty 1

## 2022-06-25 MED ORDER — METFORMIN HCL 500 MG PO TABS
500.0000 mg | ORAL_TABLET | Freq: Two times a day (BID) | ORAL | Status: DC
  Administered 2022-06-25 – 2022-07-05 (×20): 500 mg via ORAL
  Filled 2022-06-25 (×20): qty 1

## 2022-06-25 MED ORDER — ACETAMINOPHEN 500 MG PO TABS
1000.0000 mg | ORAL_TABLET | Freq: Once | ORAL | Status: AC
  Administered 2022-06-25: 1000 mg via ORAL
  Filled 2022-06-25: qty 2

## 2022-06-25 NOTE — ED Notes (Signed)
IVC/Consult completed /Rec.Inpt. Admit to [place]

## 2022-06-25 NOTE — ED Notes (Signed)
IVC/pending in-pt admit  

## 2022-06-25 NOTE — BH Assessment (Signed)
Updated referral information for Psychiatric Hospitalization faxed to;   [facility] [phone]- [phone]), no available beds.  [facility] [phone]),   [facility] ([phone])   [facility] [phone]) [phone]  [facility] [phone]),  [facility] [phone], [phone], [phone] or [phone]),   [facility] [phone])  [address] [phone]),   [facility] [phone]- [phone]), denied  [facility] [phone])  [facility] [phone] or [phone]),   [facility] [phone]).  [facility] [phone])

## 2022-06-25 NOTE — ED Notes (Signed)
Pt given lunch tray and drink at this time. 

## 2022-06-25 NOTE — ED Notes (Signed)
MD Larinda Buttery at bedside assessing patient.   Patient reports falling last night while showering in bathroom. States he crawled to bathroom sink to pull himself up. C/o pain in right hip and down thigh. Denies knee pain. Patient is requesting pain medication

## 2022-06-25 NOTE — ED Notes (Signed)
Breakfast was given to patient. 

## 2022-06-25 NOTE — ED Notes (Signed)
Pt requesting to go to [facility] hospital for care. This RN messaged TTS with request. Updated patient on status at this time. Denies current needs.

## 2022-06-25 NOTE — ED Notes (Signed)
Pt placed dinner tray outside of room after finished eating and tray disposed of at this time.

## 2022-06-25 NOTE — ED Provider Notes (Signed)
-----------------------------------------   2:45 PM on 06/25/2022 ----------------------------------------- Patient reports that he fell in the shower last night, but did not tell anyone at the time.  He is now complaining of soreness from his right hip down his right posterior thigh, is able to flex at the hip but with some pain.  He denies any pain in his knee and has no tenderness to palpation at the knee.  We will check x-ray of his right hip, treat symptomatically with Lidoderm patch and Tylenol.   Chesley Noon, MD 06/25/22 1446

## 2022-06-25 NOTE — BH Assessment (Addendum)
Referral information for Psychiatric Hospitalization re-faxed to;   [facility] [phone]- [phone]) No available beds  [facility] [phone]- [phone]),   [facility] [phone]) [phone] No behavioral health intake staff available during night shift  [facility] [phone]), Currently under review  [facility] [phone]), No answer  [facility] [phone] -[phone]), Patient denied due to multiple history of strokes  [facility] [phone]) No answer  [facility] [phone] or [phone]),   [facility] [phone])  This writer contacted [facility] [phone] at 3:13am on 06/25/22, staff reports that they are not accepting outside referrals at this time.

## 2022-06-25 NOTE — ED Notes (Signed)
Dinner tray given

## 2022-06-26 MED ORDER — SERTRALINE HCL 50 MG PO TABS
50.0000 mg | ORAL_TABLET | Freq: Every day | ORAL | Status: DC
  Administered 2022-06-27 – 2022-07-04 (×8): 50 mg via ORAL
  Filled 2022-06-26 (×8): qty 1

## 2022-06-26 MED ORDER — METHOTREXATE SODIUM CHEMO INJECTION 50 MG/2ML
25.0000 mg | INTRAMUSCULAR | Status: DC
  Administered 2022-06-26 – 2022-07-03 (×2): 25 mg via INTRAMUSCULAR
  Filled 2022-06-26 (×2): qty 1

## 2022-06-26 MED ORDER — MELATONIN 5 MG PO TABS
2.5000 mg | ORAL_TABLET | Freq: Every day | ORAL | Status: DC
  Administered 2022-06-26 – 2022-07-04 (×9): 2.5 mg via ORAL
  Filled 2022-06-26 (×9): qty 1

## 2022-06-26 NOTE — ED Notes (Signed)
Patient informed this RN he needs to take his methotrexate injectable and Simponi injectable today for his rheumatoid arthritis. This RN verified with the pharmacist methotrexate inj is available at [facility] and that an order must be placed by provider. This RN notified Dr. Fanny Bien and requested this medication for patient; Dr. Fanny Bien verified with pharmacy and is placing order.   *Simponi injectable biologic unavailable from pharmacy. Patient must supply medication from home; patient aware of this and states "he will try to call him girlfriend in the morning" to bring medication to provide to pharmacy for verification and dispensing.

## 2022-06-26 NOTE — ED Notes (Signed)
PACU to send RN down to administer IM injection to pt.

## 2022-06-26 NOTE — ED Notes (Signed)
Attempted to call dietary again about pt lunch trays and line is either busy or continues to ring. Dinner trays still not here at this time. 

## 2022-06-26 NOTE — ED Notes (Signed)
This RN and Kennith [place], ED NT provided hygiene care for patient and changed soiled brief. Bed linens dry and clean.

## 2022-06-26 NOTE — ED Notes (Signed)
Pt received dinner tray at this time.

## 2022-06-26 NOTE — ED Notes (Signed)
Called dietary about patients lunch tray not being delivered dietary stated the tray was out and going to be brought to the ED. 

## 2022-06-26 NOTE — ED Notes (Signed)
Patient ate all his breakfast. Patient brief was changed. Patient also, used the phone this morning.

## 2022-06-26 NOTE — ED Notes (Signed)
Wife came to visit pt, wife also taking pt's belongings with her.

## 2022-06-26 NOTE — ED Notes (Signed)
SDS to send RN to administer medication to pt.

## 2022-06-26 NOTE — ED Notes (Signed)
Patient given lunch tray.

## 2022-06-26 NOTE — ED Notes (Signed)
EDT assisted pt in performing ADL's. Pt provided wipes for cleaning himself, a new brief, and new hospital socks, per pt request. Pt appreciative of help and new items. Pt also provided new linens.  Night time snack given to pt. EDT opened foods for pt prior to pt receiving food. Pt made aware no more food would be provided by this EDT until morning.

## 2022-06-26 NOTE — ED Notes (Signed)
IVC/  PENDING  PLACEMENT 

## 2022-06-26 NOTE — ED Provider Notes (Signed)
Patient request methotrexate which he uses outpatient weekly.  He reports he is due for it.  Discussed with our pharmacist Barbara Cower.  Pharmacy will be developing plan to allow the patient to continue methotrexate injections weekly while in ER   Sharyn Creamer, MD 06/26/22 0403

## 2022-06-26 NOTE — ED Notes (Signed)
IM shot given in right thigh, unable to change MAR without sign off.

## 2022-06-27 NOTE — Consult Note (Signed)
During rounding, the patient was sleeping soundly, with rise and fall of chest observed.  No acute distress was noted, and there were no concerns reported by the nursing staff at this time.  Demian Maisel H. Willeen Cass, NP

## 2022-06-27 NOTE — ED Notes (Signed)
Lunch tray provided. Pt tolerated without complaint. Waste discarded appropriately. Brief changed

## 2022-06-27 NOTE — ED Provider Notes (Signed)
Emergency Medicine Observation Re-evaluation Note  Patrick Perez is a 57 y.o. male, seen on rounds today.  Pt initially presented to the ED for complaints of Suicidal  Currently, the patient is calm, no acute complaints.  Physical Exam  Blood pressure (!) 121/95, pulse 92, temperature (!) 97.5 F (36.4 C), temperature source Oral, resp. rate 19, weight 82.6 kg, SpO2 97 %. Physical Exam General: NAD Lungs: CTAB Psych: not agitated  ED Course / MDM  EKG:    I have reviewed the labs performed to date as well as medications administered while in observation.  Recent changes in the last 24 hours include no acute events overnight.    Plan  Current plan is for psych dispo. Patient is under full IVC at this time.   Sharman Cheek, MD 06/27/22 [phone]

## 2022-06-27 NOTE — ED Notes (Signed)
Hospital meal provided, pt tolerated w/o complaints.  Waste discarded appropriately.  

## 2022-06-27 NOTE — ED Notes (Addendum)
Pt updated on: He was asleep when Psych Team rounded on him this morning. The recommendation for inpatient psychiatric admission continues. He has been referred out and awaiting acceptance. Pt verbalizes that his mother informed him that he was accepted to "the [place]". CM/SW contacted per pt request.

## 2022-06-27 NOTE — ED Notes (Signed)
Alert, NAD, calm, lying in bed, denies sx.

## 2022-06-27 NOTE — ED Notes (Signed)
Pt given PM snack.  

## 2022-06-27 NOTE — ED Notes (Signed)
Standing in doorway, alert, NAD, calm, interactive, difficult to understand, therapeutic listening offered, pt lists his hx and desires, "concerned that gf has his debit and PIN, denies SI, wants to go home".

## 2022-06-27 NOTE — ED Notes (Signed)
IVC pending placement 

## 2022-06-27 NOTE — ED Notes (Signed)
Visitor back to see. Wanded. Pt agreeable, alert, NAD, calm, interactive.

## 2022-06-27 NOTE — ED Notes (Signed)

## 2022-06-27 NOTE — ED Notes (Signed)
Dinner tray provided for pt 

## 2022-06-27 NOTE — ED Notes (Signed)
Snack provided

## 2022-06-28 ENCOUNTER — Emergency Department: Payer: Medicare Other

## 2022-06-28 DIAGNOSIS — R29818 Other symptoms and signs involving the nervous system: Secondary | ICD-10-CM | POA: Diagnosis not present

## 2022-06-28 DIAGNOSIS — R519 Headache, unspecified: Secondary | ICD-10-CM | POA: Diagnosis not present

## 2022-06-28 MED ORDER — ACETAMINOPHEN 500 MG PO TABS
1000.0000 mg | ORAL_TABLET | Freq: Once | ORAL | Status: AC
  Administered 2022-06-28: 1000 mg via ORAL

## 2022-06-28 MED ORDER — MECLIZINE HCL 25 MG PO TABS
25.0000 mg | ORAL_TABLET | Freq: Once | ORAL | Status: AC
  Administered 2022-06-28: 25 mg via ORAL
  Filled 2022-06-28: qty 1

## 2022-06-28 NOTE — ED Provider Notes (Signed)
Emergency Medicine Observation Re-evaluation Note  Patrick Perez is a 57 y.o. male, seen on rounds today.  Pt initially presented to the ED for complaints of Suicidal Currently, the patient is resting.  Physical Exam  BP 106/80 (BP Location: Right Arm)   Pulse 80   Temp 97.8 F (36.6 C) (Oral)   Resp 18   Wt 82.6 kg   SpO2 99%   BMI 28.51 kg/m  Physical Exam Gen:  No acute distress Resp:  Breathing easily and comfortably, no accessory muscle usage Neuro:  Moving all four extremities, no gross focal neuro deficits Psych:  Resting currently, calm when awake  ED Course / MDM  EKG:   I have reviewed the labs performed to date as well as medications administered while in observation.  Recent changes in the last 24 hours include no significant changes.  Plan  Current plan is for transfer to another inpatient psychiatric facility.    Loleta Rose, MD 06/28/22 [phone]

## 2022-06-28 NOTE — ED Notes (Signed)
Snack given. No other needs at this time.  

## 2022-06-28 NOTE — ED Notes (Signed)
This RN attempted to contact patients mother with no answer. Left HIPAA compliant message for return call.

## 2022-06-28 NOTE — ED Notes (Signed)
Hospital meal provided, pt tolerated w/o complaints.  Waste discarded appropriately.  

## 2022-06-28 NOTE — ED Notes (Signed)
Pt in need of clean brief and wipes. Was unable to get out of bed before he needed to use restroom. Provided and assisted pt with placing brief and cleaning trash

## 2022-06-28 NOTE — ED Notes (Signed)
IVC/Pending Placement 

## 2022-06-28 NOTE — ED Notes (Signed)
Pt resting in bed.

## 2022-06-28 NOTE — ED Notes (Signed)
pt recieved snack and drink 

## 2022-06-28 NOTE — ED Notes (Signed)
Lunch tray given. 

## 2022-06-28 NOTE — ED Notes (Signed)
Pt wet his brief and requested dry one. Pt states he is trying to learn to put briefs on self but pull ups are easier. Assisted pt in brief placement and throwing trash away

## 2022-06-28 NOTE — ED Notes (Signed)
Pt reports improvement in headache and dizziness after medication administration.

## 2022-06-28 NOTE — ED Notes (Signed)
Pt c/o dizziness and headache. Pt reports this feels similar to previous strokes, MD Bradler aware and ordering head CT at this time. Pt has previous residual left sided deficits from prior strokes.

## 2022-06-28 NOTE — ED Notes (Signed)
Per request pt received new pants and socks.

## 2022-06-29 MED ORDER — CLOPIDOGREL BISULFATE 75 MG PO TABS
75.0000 mg | ORAL_TABLET | Freq: Every day | ORAL | Status: DC
  Administered 2022-06-29 – 2022-07-05 (×7): 75 mg via ORAL
  Filled 2022-06-29 (×7): qty 1

## 2022-06-29 NOTE — ED Notes (Signed)
Patient given dinner tray.

## 2022-06-29 NOTE — ED Notes (Signed)
NT assisted pt change brief at this time.

## 2022-06-29 NOTE — ED Notes (Signed)
Hospital meal provided, pt tolerated w/o complaints.  Waste discarded appropriately.  

## 2022-06-29 NOTE — ED Notes (Signed)
Pt soiled brief. Pt provided dry brief and pants. NT assisted pt with pericare.

## 2022-06-29 NOTE — ED Notes (Signed)
Patient given a snack 

## 2022-06-29 NOTE — ED Provider Notes (Signed)
Emergency Medicine Observation Re-evaluation Note  Patrick Perez is a 57 y.o. male, seen on rounds today.  Pt initially presented to the ED for complaints of Suicidal Currently, the patient is resting comfortably.  Physical Exam  BP 98/82   Pulse 90   Temp 98.4 F (36.9 C) (Oral)   Resp 17   Wt 82.6 kg   SpO2 99%   BMI 28.51 kg/m  Physical Exam General: No acute distress Cardiac: Well-perfused extremities Lungs: No respiratory distress Psych: Appropriate mood and affect  ED Course / MDM  EKG:   I have reviewed the labs performed to date as well as medications administered while in observation.  Recent changes in the last 24 hours include none.  Plan  Current plan is for placement.    Merwyn Katos, MD 06/29/22 [phone]

## 2022-06-29 NOTE — ED Notes (Signed)
Breakfast tray provided to pt at this time. 

## 2022-06-29 NOTE — ED Notes (Signed)
Pt

## 2022-06-29 NOTE — ED Notes (Signed)
Pt given nighttime snack. 

## 2022-06-30 DIAGNOSIS — F4310 Post-traumatic stress disorder, unspecified: Secondary | ICD-10-CM | POA: Diagnosis not present

## 2022-06-30 MED ORDER — GOLIMUMAB 50 MG/0.5ML ~~LOC~~ SOAJ
50.0000 mg | SUBCUTANEOUS | Status: DC
  Administered 2022-06-30: 50 mg via SUBCUTANEOUS
  Filled 2022-06-30: qty 1

## 2022-06-30 NOTE — ED Notes (Signed)
Pt complaining of throat hurting. MD notified and willing to order tylenol. Pt was told he would be getting tylenol, pt refused saying "they wont give me my morning meds if I take tylenol right now". Pt educated and informed that tylenol would not interfere with morning medications and that they would still be given to him. Pt still refuses

## 2022-06-30 NOTE — ED Notes (Signed)
Report given to Nina RN.

## 2022-06-30 NOTE — ED Notes (Signed)
Pt given lunch tray.

## 2022-06-30 NOTE — ED Notes (Signed)
Pt given supper tray.

## 2022-06-30 NOTE — ED Provider Notes (Signed)
Emergency Medicine Observation Re-evaluation Note  Patrick Perez is a 57 y.o. male, seen in the emergency department for worsening depression/suicide attempt/overdose.  No acute event since last update.  Physical Exam  BP (!) 115/93 (BP Location: Right Arm)   Pulse (!) 109   Temp 97.7 F (36.5 C) (Oral)   Resp 20   Wt 82.6 kg   SpO2 100%   BMI 28.51 kg/m   ED Course / MDM   Patient lab work from last week shows normal chemistry reassuring CBC.  Urine toxicology positive for TCA cannabinoids and benzodiazepines.  Plan  Current plan is for inpatient psychiatric treatment once available.    Minna Antis, MD 06/30/22 [phone]

## 2022-06-30 NOTE — ED Notes (Signed)
ivc/consult done/recommend psychiatric inpatient admission when medically cleared per NP Bennett.Marland Kitchen

## 2022-06-30 NOTE — ED Notes (Signed)
Pt assisted with cleaning up self. Pt assisted into  clean scrubs and socks. Pt given warm blankets and clean pillowcase. Pt given breakfast and eating at this time.

## 2022-06-30 NOTE — BH Assessment (Signed)
Writer spoke with the patient to inform him the plan for him to be admitted to [facility] has changed and that he will be referred out to an outside facility. Patient voiced his frustration about it. He remain polite and pleasant throughout the conversation.
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
## 2022-06-30 NOTE — ED Notes (Signed)
IVC/recommend psych in-pt admit

## 2022-06-30 NOTE — ED Notes (Signed)
Pt given nighttime snack. 

## 2022-06-30 NOTE — BH Assessment (Signed)
Patient referred to [place], [place] pending review.   Writer spoke with [facility] representative ([phone]. [phone]), to confirm whether or not referral was received. Yolanda advised that she would have someone from the care team to call this writer back.

## 2022-07-01 DIAGNOSIS — F4310 Post-traumatic stress disorder, unspecified: Secondary | ICD-10-CM | POA: Diagnosis not present

## 2022-07-01 NOTE — Progress Notes (Signed)
This CSW assisted with this pt due to the request that pt would like to receive services through [place]. Pt is a veteran and has been required inpatient behavioral health placement. This CSW assisted and sent to [facility] systems within [place] because pt is IVC'd and can not leave [place] with IVC.   This CSW's shift is ending,however CSW will return for 1st shift in the morning and will follow up with [facility] referrals. Mental Health Diversion must be checked within [place] system before referring outside of the [place]. CSW will follow up 07/02/22,and communicate findings to [facility] and care team.  Destination  Service Provider Address Phone Fax  [facility]  [address], [place] [address] [phone]  [facility]  [address], [place] [address] [phone]  [facility]  [address] [address] [phone]  [facility]  [address], [place] [address] [phone]  [facility] (after hours)  [address], [place] [address] [phone]      Maryjean Ka, MSW, LCSWA 07/01/2022 6:04 PM

## 2022-07-01 NOTE — ED Notes (Signed)
Breakfast tray provided for pt.  

## 2022-07-01 NOTE — ED Notes (Signed)
Hospital meal provided, pt tolerated w/o complaints.  Waste discarded appropriately.  

## 2022-07-01 NOTE — ED Provider Notes (Signed)
Emergency Medicine Observation Re-evaluation Note  Angus Hendrie is a 57 y.o. male, seen on rounds today.  Pt initially presented to the ED for complaints of Suicidal Currently, the patient is resting, voices no medical complaints.  Physical Exam  BP 110/89 (BP Location: Right Arm)   Pulse 87   Temp 98.3 F (36.8 C) (Oral)   Resp 17   Wt 82.6 kg   SpO2 97%   BMI 28.51 kg/m  Physical Exam General: Resting in no acute distress Cardiac: No cyanosis Lungs: Equal rise and fall Psych: Not agitated  ED Course / MDM  EKG:   I have reviewed the labs performed to date as well as medications administered while in observation.  Recent changes in the last 24 hours include no events overnight.  Plan  Current plan is for psychiatric disposition.    Irean Hong, MD 07/01/22 [phone]

## 2022-07-01 NOTE — BH Assessment (Signed)
TTS spoke to [facility] A[facility] [phone]) who informed this writer that they currently do not have any beds available.    IVC has been updated, by Dr. Marlou Porch, on 07/01/2022. New expiration date 07/08/2022.

## 2022-07-01 NOTE — Consult Note (Signed)
    Patrick Perez is a 57 year old white male who presents to the emergency room at [facility] with worsening depression and suicidal ideation.  He requires his involuntary commitment to be renewed since his family would like him to go to the [place] and they do not have any beds.  He is still saying that he needs help.  He had been drinking alcohol and took an overdose of Elavil prior to admission in the emergency room.  He is not safe for discharge.  He informs me that he has had 4 strokes and does speak with a lisp and is difficult to understand but pleasant and cooperative.

## 2022-07-01 NOTE — ED Notes (Signed)
Patient is IVC paper has been renew patient is pending admit

## 2022-07-01 NOTE — ED Notes (Signed)
VOL/pts IVC order expired 06/30/22 pt now VOL pending inpatient psych admission. TTS, charge nurse, and Dr. Dolores Frame notified.

## 2022-07-01 NOTE — ED Notes (Signed)
IVC 

## 2022-07-01 NOTE — ED Notes (Signed)
Helped pt apply new brief

## 2022-07-01 NOTE — ED Notes (Signed)
IVC/pending psych inpatient admission. 

## 2022-07-02 NOTE — ED Notes (Signed)
Pt given lunch tray.

## 2022-07-02 NOTE — BH Assessment (Signed)
Per [facility] [place]), patient to be referred out of system.  Referral information for Psychiatric Hospitalization faxed to;   [facility] [phone]),  [place] [phone]--- [phone]--- [phone])  [address] [phone]),   [facility] [phone] -or- [phone]),   [facility] [phone]),  [place] [phone])  [facility] [phone])

## 2022-07-02 NOTE — BH Assessment (Signed)
Patient is recommended for psych inpatient treatment. Patient has been referred to outside hospitals, per [facility].  No bed offers at this time. Pending bed offer.

## 2022-07-02 NOTE — ED Notes (Signed)
Pt given PM snack. Brief changed.

## 2022-07-02 NOTE — ED Notes (Signed)
Pt needed assistance replacing his brief. This NT helped pt place a new brief on himself.

## 2022-07-02 NOTE — ED Notes (Signed)
Pt given breakfast tray

## 2022-07-02 NOTE — BH Assessment (Signed)
Per [facility] Tresa Endo), patient to be referred out of system.  Referral information for Psychiatric Hospitalization faxed to;   [facility] [phone]- [phone]) No available beds  [facility] [place] [phone]),   [facility] [phone] -[phone]) [phone]  [facility] [phone]),  [facility] [phone] -or- [phone]),   [facility] [phone] or [phone]),

## 2022-07-02 NOTE — ED Notes (Signed)
Pt given supper tray.

## 2022-07-03 LAB — CBG MONITORING, ED: Glucose-Capillary: 102 mg/dL — ABNORMAL HIGH (ref 70–99)

## 2022-07-03 NOTE — ED Notes (Signed)
Same day surgery RN called about methotrexate injection. Will be down shortly to administer

## 2022-07-03 NOTE — BH Assessment (Signed)
Writer called and spoke to [employer] Pharmacist, community at [facility], ext. [phone], who stated there are currently no male beds available at this time but could try tomm to see if anything opens up.

## 2022-07-03 NOTE — ED Notes (Signed)
Pt used phone at this time.

## 2022-07-03 NOTE — ED Notes (Signed)
Hospital meal provided, pt tolerated w/o complaints.  Waste discarded appropriately.  

## 2022-07-03 NOTE — ED Provider Notes (Signed)
Emergency Medicine Observation Re-evaluation Note  Lavonne Amparano is a 57 y.o. male, seen on rounds today.  Pt initially presented to the ED for complaints of Suicidal Currently, the patient is resting, voices no medical complaints.  Physical Exam  BP 122/76 (BP Location: Right Arm)   Pulse 88   Temp 98.6 F (37 C) (Oral)   Resp 16   Wt 82.6 kg   SpO2 98%   BMI 28.51 kg/m  Physical Exam General: Resting in no acute distress Cardiac: No cyanosis Lungs: Equal rise and fall Psych: Not agitated  ED Course / MDM  EKG:   I have reviewed the labs performed to date as well as medications administered while in observation.  Recent changes in the last 24 hours include no events overnight.  Plan  Current plan is for psychiatric disposition.    Irean Hong, MD 07/03/22 [phone]

## 2022-07-03 NOTE — ED Notes (Signed)
Visitor at bedside.

## 2022-07-03 NOTE — ED Notes (Signed)
Pt asleep; no snack given.

## 2022-07-03 NOTE — ED Notes (Signed)
Pt requesting new brief; assisted pt with changing brief. No other needs voiced at this time.

## 2022-07-03 NOTE — ED Notes (Signed)
IVC/  PENDING  PLACEMENT 

## 2022-07-03 NOTE — ED Notes (Signed)
Pt given dinner tray and drink. No other needs voiced at this time. 

## 2022-07-03 NOTE — Progress Notes (Addendum)
This CSW received a phone call from April, [place] Care Coordinator [phone] EXT: [phone] who advised the following Bed Availability:  [place] [place], [place]- Mental Health Diversion  [place], [place]- Mental Health Diversion [place], [place]ental Health Diversion [place] -1 open bed  [facility] [facility] if pt IVC rescinded  [place], [place]- 4 beds [place], [place]- 12 beds [place] -2 beds  Management of Pt's Care is [facility] [phone] Two PA Providers: Aundria Rud, DMSc, PA-C [place], [place] [phone]     Care Team notified: Rosalita Chessman, LCAS, Sharolyn Douglas, RN, [address], LCAS-A, Day [facility] Rona Ravens, RN   Maryjean Ka, MSW, [facility] 07/03/2022 11:40 AM

## 2022-07-04 DIAGNOSIS — F4310 Post-traumatic stress disorder, unspecified: Secondary | ICD-10-CM | POA: Diagnosis not present

## 2022-07-04 MED ORDER — ACETAMINOPHEN 325 MG PO TABS
650.0000 mg | ORAL_TABLET | Freq: Once | ORAL | Status: AC
  Administered 2022-07-04: 650 mg via ORAL
  Filled 2022-07-04: qty 2

## 2022-07-04 NOTE — ED Notes (Signed)
Pt done with his lunch tray. Pt at doorway with urinal to empty it; restroom currently occupied by other pt so will empty momentarily. Pt c/c with staff.

## 2022-07-04 NOTE — ED Notes (Signed)
Brief changed ?

## 2022-07-04 NOTE — Consult Note (Addendum)
[facility] Face-to-Face Psychiatry Consult   Reason for Consult:  Suicide attempt / Drug overdose Referring Physician:  Harlon Ditty, NP Patient Identification: Patrick Perez MRN:  [id number] Principal Diagnosis: Posttraumatic stress disorder Diagnosis:  Principal Problem:   Posttraumatic stress disorder Active Problems:   Weakness due to old stroke   Alcohol abuse   Marijuana smoker   Total Time spent with patient: 20 minutes  Subjective:   Patrick Perez is a 57 y.o. male patient admitted with "Getting better everyday."  05/21: On assessment today, patient observed sitting up on side of bed.  His speech is slurred making it somewhat difficult to understand him.  He is pleasant and cooperative, with normal attention and perception. He denies suicidal or homicidal ideation, auditory and visual hallucinations, paranoia, and delusional thinking. He does not appear to be responding to internal stimuli, nor is there evidence of delusional thinking. Sleep and appetite are reported as satisfactory. The patient has been compliant with his medication regimen and has not required any use of as needed agitation orders throughout his hospitalization.  I spoke with the patient's caregiver, [name], who expresses concerns about the patient's need for a higher level of care due to his physical impairments and her own health issues.  She believes that the patient requires rehabilitation and mentions his previous rehabilitation at [facility] following his initial strokes. [name] also notes plans for the patient to eventually be transferred to [place] for family support. 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 She expresses a desire for her son to be transferred to [place] so she can be a part of his recovery process.  Disposition: The patient is cleared psychiatrically. The patient's caregiver, [name], states she is unable to  accommodate his physical impairments and indicated a need for a higher level of care. TOC consult initiated for placement  05/16: On assessment today, patient observed standing in the doorway of his assigned room.  His speech is garbled and slurred making it somewhat difficult to understand him.  He expresses a desire to return home with a caregiver. The patient remained calm and cooperative throughout the encounter. Attention and perception appeared normal.  He does not appear to be responding to stimuli. There is no evidence of delusional thinking. He denies suicidal and homicidal ideation, similarly he denies experiencing auditory and visual hallucinations. Sleep and appetite are reported as satisfactory.  The patient has been compliant with his medications throughout his hospitalization.  There are no aggressive behaviors noted or reported at this time. The patient has been faxed out for placement; unfortunately, no bed offers have been received at this time.     Per Dr. Toni Amend 05/11: HPI: Patient seen and chart reviewed.  57 year old man with a history of PTSD also history of strokes with lasting cognitive and functional deficits.  IVC brought in by med and police reports of agitated behavior threatening behavior towards others.  Suicidal thoughts.  The patient's girlfriend reports patient has been increasingly agitated swinging objects at others trying to hit people and also talking about suicide.  Patient is currently denying suicidal ideation but acknowledges anxiety.  I note that the patient [facility] as well and at that time there was concern about suicidal thinking as well as her recent suicide attempt just a few days ago but was not thought to need hospitalization.  Report was made at that time that the patient was  drinking heavily.  Alcohol level today is negative.  Drug screen positive for cannabis and benzodiazepines.  Does not appear that the patient is normally  prescribed benzodiazepines.   Past Psychiatric History: Past history of a diagnosis of PTSD related to combat trauma.  History of stroke x 2.  Multiple suicide attempts.         Risk to Self:    Risk to Others:   Prior Inpatient Therapy:   Prior Outpatient Therapy:    Past Medical History:  Past Medical History:  Diagnosis Date   Anxiety    Arthritis    rheumatoid   Depression    Diabetes mellitus without complication (HCC)    Stroke [facility])     Past Surgical History:  Procedure Laterality Date   TIBIA FRACTURE SURGERY Left    TONSILLECTOMY     Family History:  Family History  Problem Relation Age of Onset   Diabetes Mother    Cancer Father        prostate   Cancer Paternal Grandfather        colon   Family Psychiatric  History: No pertinent family psychiatric history on file. Social History:  Social History   Substance and Sexual Activity  Alcohol Use Yes   Alcohol/week: 2.0 standard drinks of alcohol   Types: 2 Cans of beer per week     Social History   Substance and Sexual Activity  Drug Use Yes   Frequency: 7.0 times per week   Types: Marijuana   Comment: smokes once a day     Social History   Socioeconomic History   Marital status: Divorced    Spouse name: Not on file   Number of children: 3   Years of education: Not on file   Highest education level: GED or equivalent  Occupational History   Occupation: Disabled  Tobacco Use   Smoking status: Former    Packs/day: 0.50    Years: 27.00    Additional pack years: 0.00    Total pack years: 13.50    Types: Cigarettes    Quit date: 04/27/2020    Years since quitting: 2.1   Smokeless tobacco: Never   Tobacco comments:    using nicorett gum  Vaping Use   Vaping Use: Never used  Substance and Sexual Activity   Alcohol use: Yes    Alcohol/week: 2.0 standard drinks of alcohol    Types: 2 Cans of beer per week   Drug use: Yes    Frequency: 7.0 times per week    Types: Marijuana    Comment:  smokes once a day    Sexual activity: Yes  Other Topics Concern   Not on file  Social History Narrative   Not on file   Social Determinants of Health   Financial Resource Strain: Low Risk  (08/29/2017)   Overall Financial Resource Strain (CARDIA)    Difficulty of Paying Living Expenses: Not hard at all  Food Insecurity: No Food Insecurity (08/29/2017)   Hunger Vital Sign    Worried About Running Out of Food in the Last Year: Never true    Ran Out of Food in the Last Year: Never true  Transportation Needs: No Transportation Needs (08/29/2017)   PRAPARE - Administrator, Civil Service (Medical): No    Lack of Transportation (Non-Medical): No  Physical Activity: Inactive (08/29/2017)   Exercise Vital Sign    Days of Exercise per Week: 0 days    Minutes of Exercise  per Session: 0 min  Stress: No Stress Concern Present (08/29/2017)   [employer] of Occupational Health - Occupational Stress Questionnaire    Feeling of Stress : Only a little  Social Connections: Unknown (08/29/2017)   Social Connection and Isolation Panel [NHANES]    Frequency of Communication with Friends and Family: Patient declined    Frequency of Social Gatherings with Friends and Family: Patient declined    Attends Religious Services: Patient declined    Database administrator or Organizations: Patient declined    Attends Banker Meetings: Patient declined    Marital Status: Divorced   Additional Social History:    Allergies:   Allergies  Allergen Reactions   Quetiapine Rash    Labs:  Results for orders placed or performed during the hospital encounter of 06/23/22 (from the past 48 hour(s))  POC CBG, ED     Status: Abnormal   Collection Time: 07/03/22  7:40 PM  Result Value Ref Range   Glucose-Capillary 102 (H) 70 - 99 mg/dL    Comment: Glucose reference range applies only to samples taken after fasting for at least 8 hours.     Current Facility-Administered Medications   Medication Dose Route Frequency Provider Last Rate Last Admin   clopidogrel (PLAVIX) tablet 75 mg  75 mg Oral Daily Merwyn Katos, MD   75 mg at 07/04/22 1025   diphenhydrAMINE (BENADRYL) capsule 50 mg  50 mg Oral TID PRN Jessie Schrieber H, NP       Or   diphenhydrAMINE (BENADRYL) injection 50 mg  50 mg Intramuscular Q6H PRN Gao Mitnick H, NP       folic acid (FOLVITE) tablet 1 mg  1 mg Oral Daily Clapacs, John T, MD   1 mg at 07/04/22 1026   Golimumab SOAJ 50 mg  50 mg Subcutaneous Q30 days Minna Antis, MD   50 mg at 06/30/22 1656   haloperidol (HALDOL) tablet 5 mg  5 mg Oral TID PRN Killian Ress H, NP       Or   haloperidol lactate (HALDOL) injection 5 mg  5 mg Intramuscular TID PRN Eurydice Calixto H, NP       hydrOXYzine (ATARAX) tablet 50 mg  50 mg Oral TID PRN Verniece Encarnacion H, NP   50 mg at 06/25/22 0102   lidocaine (LIDODERM) 5 % 1 patch  1 patch Transdermal Q24H Chesley Noon, MD   1 patch at 07/03/22 1824   loperamide (IMODIUM) capsule 2 mg  2 mg Oral BID PRN Pilar Jarvis, MD   2 mg at 06/23/22 2249   LORazepam (ATIVAN) tablet 2 mg  2 mg Oral TID PRN Nalayah Hitt H, NP       Or   LORazepam (ATIVAN) injection 2 mg  2 mg Intramuscular TID PRN Vernie Vinciguerra H, NP       melatonin tablet 2.5 mg  2.5 mg Oral QHS Shaune Pollack, MD   2.5 mg at 07/03/22 2153   metFORMIN (GLUCOPHAGE) tablet 500 mg  500 mg Oral BID Phineas Semen, MD   500 mg at 07/04/22 1026   methotrexate chemo injection 25 mg  25 mg Intramuscular Weekly Sharyn Creamer, MD   25 mg at 07/03/22 1415   multivitamin with minerals tablet 1 tablet  1 tablet Oral Daily Clapacs, Jackquline Denmark, MD   1 tablet at 07/04/22 1026   OLANZapine zydis (ZYPREXA) disintegrating tablet 10 mg  10 mg Oral Q8H PRN Clapacs, Jackquline Denmark, MD  And   ziprasidone (GEODON) injection 20 mg  20 mg Intramuscular PRN Clapacs, John T, MD       sertraline (ZOLOFT) tablet 50 mg  50 mg Oral QHS Drusilla Kanner, RPH   50 mg at  07/03/22 2154   thiamine (VITAMIN B1) tablet 100 mg  100 mg Oral Daily Clapacs, John T, MD   100 mg at 07/04/22 1027   Or   thiamine (VITAMIN B1) injection 100 mg  100 mg Intravenous Daily Clapacs, John T, MD       traZODone (DESYREL) tablet 50 mg  50 mg Oral QHS Morgen Linebaugh H, NP   50 mg at 07/03/22 2154   Current Outpatient Medications  Medication Sig Dispense Refill   amitriptyline (ELAVIL) 25 MG tablet Take 25 mg by mouth at bedtime.     atorvastatin (LIPITOR) 80 MG tablet TAKE 1 TABLET BY MOUTH EVERY DAY 30 tablet 1   carvedilol (COREG) 3.125 MG tablet Take 2 tablets (6.25 mg total) by mouth 2 (two) times daily with a meal. (Patient taking differently: Take 3.125 mg by mouth 2 (two) times daily with a meal.) 60 tablet 1   Cholecalciferol (VITAMIN D3) 10 MCG (400 UNIT) tablet Take by mouth.     clopidogrel (PLAVIX) 75 MG tablet TAKE 1 TABLET BY MOUTH EVERY DAY 90 tablet 0   cyclobenzaprine (FLEXERIL) 10 MG tablet Take 10 mg by mouth 3 (three) times daily as needed for muscle spasms.     folic acid (FOLVITE) 1 MG tablet Take 1 mg by mouth daily.     gabapentin (NEURONTIN) 300 MG capsule Take 1 capsule (300 mg total) by mouth at bedtime. Dr Alfredo Batty (Patient taking differently: Take 300 mg by mouth 3 (three) times daily. Dr Alfredo Batty) 30 capsule 1   melatonin 3 MG TABS tablet Take by mouth.     metFORMIN (GLUCOPHAGE) 500 MG tablet TAKE 1 TABLET BY MOUTH 2 TIMES DAILY WITH A MEAL. (Patient taking differently: Take 500 mg by mouth 2 (two) times daily with a meal.) 60 tablet 0   Methotrexate Sodium (METHOTREXATE, PF,) 50 MG/2ML injection Inject 25 mg into the muscle once a week.     sertraline (ZOLOFT) 50 MG tablet Take 1 tablet (50 mg total) by mouth daily. 30 tablet 0   SIMPONI 50 MG/0.5ML SOAJ Inject 50 mg into the skin every 30 (thirty) days.     traMADol (ULTRAM) 50 MG tablet Take 50 mg by mouth every 12 (twelve) hours as needed for moderate pain or severe pain.     OneTouch Delica  Lancets 33G MISC USE TO TEST ONCE DAILY 100 each 0    Musculoskeletal: Strength & Muscle Tone: decreased Gait & Station: broad based Patient leans: N/A            Psychiatric Specialty Exam:  Presentation  General Appearance:  Casual  Eye Contact: Fair; Other (comment) (tracks speech)  Speech: Garbled; Slow; Slurred  Speech Volume: Decreased  Handedness:No data recorded  Mood and Affect  Mood: Euthymic  Affect: Flat   Thought Process  Thought Processes: Other (comment) (unable to fully assess due to level of impairment)  Descriptions of Associations:Loose (unable to fully assess due to level of impairment)  Orientation:Partial (place, year, situation)  Thought Content:Other (comment) (unable to fully assess due to level of impairment)  History of Schizophrenia/Schizoaffective disorder:No  Duration of Psychotic Symptoms:No data recorded Hallucinations:No data recorded Ideas of Reference:None  Suicidal Thoughts:No data recorded Homicidal Thoughts:No data recorded  Sensorium  Memory: Immediate Fair  Judgment: Other (comment) (unable to fully assess due to level of impairment)  Insight: Other (comment) (unable to fully assess due to level of impairment)   Executive Functions  Concentration: Fair  Attention Span: Fair  Recall: [employer] of Knowledge: Fair  Language: Poor   Psychomotor Activity  Psychomotor Activity:No data recorded  Assets  Assets: Resilience; Desire for Improvement   Sleep  Sleep:No data recorded  Physical Exam: Physical Exam Vitals and nursing note reviewed.  HENT:     Head: Normocephalic.     Nose: Nose normal.  Pulmonary:     Effort: Pulmonary effort is normal.  Musculoskeletal:     Cervical back: Normal range of motion.  Neurological:     Mental Status: He is alert.     Motor: Weakness present.  Psychiatric:        Attention and Perception: He does not perceive auditory or visual  hallucinations.        Mood and Affect: Mood is anxious.        Speech: Speech is slurred.        Behavior: Behavior is cooperative.        Thought Content: Thought content is not paranoid or delusional. Thought content does not include homicidal or suicidal ideation.        Cognition and Memory: Memory normal.        Judgment: Judgment normal.    ROS Blood pressure 116/73, pulse 75, temperature 98.2 F (36.8 C), temperature source Oral, resp. rate 20, weight 82.6 kg, SpO2 98 %. Body mass index is 28.51 kg/m.    PLAN: 57 year old male with a history of PTSD and suicidal thoughts. The patient has been referred out for placement; unfortunately, no bed offers have been received at this time.  The patient has been compliant with his medication and has exhibited no aggressive or negative behaviors during his hospitalization.  The patient is cleared psychiatrically. The patient's caregiver, [name], states she is unable to accommodate his physical impairments and indicated a need for a higher level of care. TOC consult initiated for placement.   Disposition:  No evidence of imminent risk to self or others at present.   Patient does not meet criteria for psychiatric inpatient admission. Supportive therapy provided about ongoing stressors.  Norma Fredrickson, NP 07/04/2022 12:09 PM

## 2022-07-04 NOTE — ED Notes (Signed)
Pt had came out of room requesting new brief and trash to be taken out of room. Per request pt's brief was changed and trash was disposed of properly

## 2022-07-04 NOTE — ED Notes (Signed)
Pt received drink and lunch tray.

## 2022-07-04 NOTE — ED Notes (Signed)
Ivc /pending placement 

## 2022-07-04 NOTE — ED Notes (Signed)
Pt denies any needs.

## 2022-07-04 NOTE — ED Notes (Signed)
Pt received dinner tray and drink. Visitor at bedside.

## 2022-07-05 MED ORDER — SERTRALINE HCL 50 MG PO TABS: 50.0000 mg | ORAL_TABLET | Freq: Every day | ORAL | 3 refills | Status: AC

## 2022-07-05 MED ORDER — ACETAMINOPHEN 325 MG PO TABS
650.0000 mg | ORAL_TABLET | Freq: Once | ORAL | Status: AC
  Administered 2022-07-05: 650 mg via ORAL
  Filled 2022-07-05: qty 2

## 2022-07-05 NOTE — ED Notes (Signed)
Patient given breakfast tray.

## 2022-07-05 NOTE — ED Provider Notes (Signed)
Emergency Medicine Observation Re-evaluation Note  Patrick Perez is a 57 y.o. male, seen on rounds today.  Pt initially presented to the ED for complaints of Suicidal Currently, the patient is sleeping/   Physical Exam  BP (!) 114/90 (BP Location: Right Arm)   Pulse 73   Temp 98 F (36.7 C) (Oral)   Resp 18   Wt 82.6 kg   SpO2 96%   BMI 28.51 kg/m  Physical Exam General: sleeping, no distress Lungs: nml wob Psych: no agitation  ED Course / MDM  EKG:   I have reviewed the labs performed to date as well as medications administered while in observation.  Recent changes in the last 24 hours include none.  Plan  Current plan is for inpatient psych admission.  .  Patient is currently under IVC.    Georga Hacking, MD 07/05/22 Jeralyn Bennett

## 2022-07-05 NOTE — ED Notes (Signed)
ivc/pending placement.. 

## 2022-07-05 NOTE — TOC Initial Note (Signed)
Transition of Care [facility]) - Initial/Assessment Note    Patient Details  Name: Patrick Perez MRN: [id number] Date of Birth: 12-27-1965  [facility] CM/SW Contact:    [name], LCSW Phone Number: 07/05/2022, 1:51 PM  Clinical Narrative:                  CSW spoke with patient and girlfriend [name] at bedside.   [name] reports being able to take patient home today with support from patient's children and mother. She reports he was approved for a program in [place] where [place] is from, through the [place]. Reports they will be taking patient there upon discharge for additional vet services that [place] offers that [name] is connected with. She requests IVC be rescinded (expires 5/25) to be able to transport patient home today.   She requests any updated medications be sent to [facility].   TOC has informed psych NP and RN of above.    Barriers to Discharge: No Barriers Identified   Patient Goals and CMS Choice            Expected Discharge Plan and Services                                              Prior Living Arrangements/Services                       Activities of Daily Living      Permission Sought/Granted                  Emotional Assessment              Admission diagnosis:  IVC Patient Active Problem List   Diagnosis Date Noted   Posttraumatic stress disorder 06/24/2022   Adjustment disorder with mixed disturbance of emotions and conduct 06/16/2022   Vertebral artery occlusion, right 06/13/2022   Acute metabolic encephalopathy 06/12/2022   Hypomagnesemia 06/12/2022   Drug overdose, intentional (HCC) 06/08/2022   Weakness due to old stroke 05/09/2022   Alcohol abuse 05/09/2022   Marijuana smoker 05/09/2022   Chronic pain syndrome 05/09/2022   Acute stroke due to ischemia (HCC) 06/01/2021   CAP (community acquired pneumonia) 06/01/2021   Hyponatremia 06/01/2021   Sepsis (HCC) 06/01/2021   Abnormal LFTs  06/01/2021   Diabetes mellitus without complication (HCC) 06/01/2021   Anxiety and depression 06/01/2021   RA (rheumatoid arthritis) (HCC) 09/13/2015   Rheumatoid arthritis (HCC) 09/13/2015   Laceration of right hand with complication 08/10/2014   PCP:  [name], MD Pharmacy:   [facility] [address] [place] - [address] [place] [address] Phone: [phone] Fax: [phone]     Social Determinants of Health (SDOH) Social History: SDOH Screenings   Food Insecurity: No Food Insecurity (08/29/2017)  Transportation Needs: No Transportation Needs (08/29/2017)  Depression (PHQ2-9): Medium Risk (07/07/2021)  Financial Resource Strain: Low Risk  (08/29/2017)  Physical Activity: Inactive (08/29/2017)  Social Connections: Unknown (08/29/2017)  Stress: No Stress Concern Present (08/29/2017)  Tobacco Use: Medium Risk (06/18/2022)   SDOH Interventions:     Readmission Risk Interventions     No data to display

## 2022-07-05 NOTE — ED Notes (Signed)
Pt woke up asking for a new brief to be placed because the one he had on was soiled. This tech got and helped pt put on new brief and empty urinal that was in pt room. Pt laid back down after, no other needs at the moment.

## 2022-07-05 NOTE — ED Notes (Signed)
Pt spouse here- has plan for family members to come to the house to assist patient with ADLs. Pt denies SI/HI & AH/VH. Pt has been psych cleared for discharge. E-signature not working at this time. Pt verbalized understanding of D/C instructions, prescriptions and follow up care with no further questions at this time. Pt in NAD and ambulatory at time of D/C. Significant other tonya taking patient home at this time.

## 2022-07-18 DIAGNOSIS — Z8673 Personal history of transient ischemic attack (TIA), and cerebral infarction without residual deficits: Principal | ICD-10-CM

## 2022-07-18 DIAGNOSIS — I10 Essential (primary) hypertension: Principal | ICD-10-CM

## 2022-07-18 MED ORDER — GABAPENTIN 300 MG CAPSULE
ORAL_CAPSULE | Freq: Three times a day (TID) | ORAL | 3 refills | 0 days
Start: 2022-07-18 — End: ?

## 2022-07-18 MED ORDER — CYCLOBENZAPRINE 10 MG TABLET
ORAL_TABLET | Freq: Two times a day (BID) | ORAL | 0 refills | 20 days | PRN
Start: 2022-07-18 — End: 2022-08-07

## 2022-07-18 MED ORDER — CLOPIDOGREL 75 MG TABLET
ORAL_TABLET | Freq: Every day | ORAL | 0 refills | 20 days | Status: CP
Start: 2022-07-18 — End: 2022-08-07

## 2022-07-18 MED ORDER — METOPROLOL TARTRATE 25 MG TABLET
ORAL_TABLET | Freq: Two times a day (BID) | ORAL | 1 refills | 0 days
Start: 2022-07-18 — End: ?

## 2022-07-18 NOTE — Unmapped (Addendum)
Patient is requesting the following refill  Requested Prescriptions     Pending Prescriptions Disp Refills    clopidogrel (PLAVIX) 75 mg tablet [Pharmacy Med Name: CLOPIDOGREL 75 MG TABLET] 90 tablet 1     Sig: Take 1 tablet (75 mg total) by mouth daily.    cyclobenzaprine (FLEXERIL) 10 MG tablet [Pharmacy Med Name: CYCLOBENZAPRINE 10 MG TABLET] 90 tablet 1     Sig: Take 1 tablet (10 mg total) by mouth Three (3) times a day as needed.       Recent Visits  Date Type Provider Dept   04/11/22 Office Visit Mangel, Benison Pap, DO [place] [facility] [address] [facility]   01/09/22 Office Visit Mangel, Benison Pap, DO [place] [facility] [address] [facility]   12/12/21 Office Visit Mangel, Benison Pap, DO [place] [facility] [address] [facility]   11/14/21 Office Visit Mangel, Benison Pap, DO [place] [facility] [address] At [facility]   Showing recent visits within past 365 days with a meds authorizing provider and meeting all other requirements  Future Appointments  Date Type Provider Dept   08/07/22 Appointment Mangel, Benison Pap, DO [place] [facility] [address] [facility]   Showing future appointments within next 365 days with a meds authorizing provider and meeting all other requirements       Labs: Not applicable this refill    -----------------------------------------------------------------  Only providing refill until appointment on 08/07/2022.   Will decline Flexeril referral--he has been made aware that he needs to follow with pain management.

## 2022-07-19 MED ORDER — METOPROLOL TARTRATE 25 MG TABLET
ORAL_TABLET | Freq: Two times a day (BID) | ORAL | 1 refills | 90 days
Start: 2022-07-19 — End: ?

## 2022-07-19 MED ORDER — GABAPENTIN 300 MG CAPSULE
ORAL_CAPSULE | Freq: Three times a day (TID) | ORAL | 1 refills | 30 days | Status: CP
Start: 2022-07-19 — End: 2022-09-17

## 2022-07-20 MED ORDER — CYCLOBENZAPRINE 10 MG TABLET
ORAL_TABLET | Freq: Three times a day (TID) | ORAL | 1 refills | 30 days | PRN
Start: 2022-07-20 — End: 2023-07-20

## 2022-07-20 NOTE — Unmapped (Signed)
Patient is requesting the following refill  Requested Prescriptions     Pending Prescriptions Disp Refills    cyclobenzaprine (FLEXERIL) 10 MG tablet [Pharmacy Med Name: CYCLOBENZAPRINE 10 MG TABLET] 90 tablet 1     Sig: Take 1 tablet (10 mg total) by mouth Three (3) times a day as needed.       Recent Visits  Date Type Provider Dept   04/11/22 Office Visit Mangel, Benison Pap, DO [place] [facility] [address] [facility]   01/09/22 Office Visit Mangel, Benison Pap, DO [place] [facility] [address] [facility]   12/12/21 Office Visit Mangel, Benison Pap, DO [place] [facility] [address] [facility]   11/14/21 Office Visit Mangel, Benison Pap, DO [place] [facility] [address] [facility]   Showing recent visits within past 365 days with a meds authorizing provider and meeting all other requirements  Future Appointments  Date Type Provider Dept   08/07/22 Appointment Mangel, Benison Pap, DO [place] [facility] [address] [facility]   Showing future appointments within next 365 days with a meds authorizing provider and meeting all other requirements       Labs: Not applicable this refill

## 2022-07-20 NOTE — Unmapped (Signed)
This patient is not suppose to receive anymore, he needs to see pain management. We can discuss further at his appointment as well on 08/07/2022

## 2022-07-21 MED FILL — Golimumab IV Soln 50 MG/4ML: INTRAVENOUS | Qty: 4 | Status: AC

## 2022-08-05 DIAGNOSIS — M0579 Rheumatoid arthritis with rheumatoid factor of multiple sites without organ or systems involvement: Principal | ICD-10-CM

## 2022-08-05 MED ORDER — AMITRIPTYLINE 25 MG TABLET
ORAL_TABLET | Freq: Every evening | ORAL | 3 refills | 0 days
Start: 2022-08-05 — End: ?

## 2022-08-05 MED ORDER — TRAMADOL 50 MG TABLET
ORAL_TABLET | Freq: Two times a day (BID) | ORAL | 0 refills | 0 days | PRN
Start: 2022-08-05 — End: ?

## 2022-08-06 MED ORDER — TRAMADOL 50 MG TABLET
ORAL_TABLET | Freq: Two times a day (BID) | ORAL | 0 refills | 30 days | PRN
Start: 2022-08-06 — End: ?

## 2022-08-06 NOTE — Unmapped (Unsigned)
Assessment and Plan:   Diagnoses and all orders for this visit:    Suicide ideation    Intraparenchymal hemorrhage of brain (CMS-HCC)    At risk for falls    Alcohol use disorder, moderate    Moderate episode of recurrent major depressive disorder (CMS-HCC)    PTSD (post-traumatic stress disorder)    Other chronic pain    Polysubstance use disorder    Primary hypertension    ---------------  57 yo male presents for follow up     #Alcohol use, Polysubstance use, Suicide Ideation, Depression, PTSD    #Chronic Pain  -We will no longer be filling Flexiril for him as his UDS was positive for Benzos during ER visit on 06/23/2022  -    #CVA, At risk of falls  -He has left sided deficits from previous strokes, aphasia   -Recent stroke ***    #Preventative Health  -Referral for colonoscopy was placed previously but not done   -PSA was WNL on 11/14/2021. May repeat 11/2022  -Still due for second Shingles vaccine.   -He does meet criteria for LDCT but previously declined Lung Cancer screening      Subjective:     Paul Rezendes Sr. 56 y.o.male   is being seen for follow up     SINCE LAST VISIT   -He was found to have another stroke in April 2024  -He went to the ED on 05/26/2022 for chest pain  -ED visit/admission for overdose Late April     -He missed his follow up appointment on 06/22/2022    -ED visit on 06/23/2022 for Suicide ideation. It was noted that he was under IVC at this time, he was admitted to Inpatient Psych. He was discharged on 07/05/2022  -It was also noted that he had a positive UDS for Cannabis and Benzodiazepines (He is not prescribed Benzos)    -initial RN note from 06/23/2022 at 8:16 PM: Pt arrives with [place] [employer] with IVC in hand. Per IVC papers, pt has been IVC'd due to swinging objects at other and the want to kill himself. Per Burna Mortimer the petitioner, the pt caretaker Paul Hurst pt caretaker and girlfriend heard pt that he wanted to kill other and himself as well as pt has not been taking care of himself as in hygiene and eating food. Pt completely denies all of this. Pt sts that tonya is taking things from him and sealing his money and attempting to take over all of his stuff.     RN note from 06/24/2022 at 6:41 AM: MD notified of pts HR. Pt was encouraged to take carvedilol, pt refused stating he wants to speak to a doctor, we are violating his rights... MD notified that pt was unwilling to take medication     RN note from 06/24/2022 at 7:37 AM: Patient is awake, Patient with severe aphasia, trying to tell nurse things and cannot understand, nurse gave him paper and pen and He did write that He was feeling as His rights were being violated here and that He was too stuffy in room, feeling claustrophobic, and that He did not want antianxiety medications because that is not the answer, talked about His PTSD from being in the [employer]. Patient hopes to leave soon .       From NP Note on 07/04/2022: I spoke with the patient's caregiver, Paul Hurst, who expresses concerns about the patient's need for a higher level of care due to his physical impairments and her own health issues. She  believes that the patient requires rehabilitation and mentions his previous rehabilitation at [facility] following his initial strokes. Paul Hurst also notes plans for the patient to eventually be transferred to [place] for family support. 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 She expresses a desire for her son to be transferred to [place] so she can be a part of his recovery process.     ROS:     Review of systems negative unless otherwise noted as per HPI    Vital Signs:     Wt Readings from Last 3 Encounters:   05/16/22 81.6 kg (180 lb)   05/13/22 81.6 kg (180 lb)   04/11/22 82.1 kg (180 lb 14.4 oz)     Temp Readings from Last 3 Encounters:   05/19/22 36.3 ??C (97.3 ??F)   05/16/22 36.8 ??C (98.2 ??F)   05/13/22 37.1 ??C (98.8 ??F) (Oral)     BP Readings from Last 3 Encounters:   05/19/22 120/99   05/16/22 104/77   05/13/22 118/84     Pulse Readings from Last 3 Encounters:   05/19/22 94   05/16/22 72   05/13/22 83       Objective:     General Appearance: Alert, cooperative, no distress, appears stated age.   EYES: PERRL, conjunctiva/corneas clear, EOM's intact, fundi  benign, both eyes  ENT:  External canals clear, Tympanic membrane pearly grey with normal light reflex bilaterally.   NECK: No carotid bruits.  No palpable cervical or supraclavicular lymphadenopathy. Thyroid smooth, normal size  CV: Regular rate and rhythm. Normal S1 and S2. No murmurs, gallops, or rubs.  No dependent edema noted  RESP: Normal respiratory effort.  Clear to auscultation bilaterally without wheezes, rhonchi or crackles.   GI: Normal abdominal bowel sounds. Soft, non-tender and non-distended.   [id number]  EXT:  No lower extremity edema.    MSK: Full upright posture, smooth and symmetric gait. All major joints show full range of motion without discomfort. Strength is 5/5 in the upper and lower extremities.   SKIN: No rashes or suspicious focal lesions noted.   LYMPH NODES: Cervical, supraclavicular nodes normal  NEURO: Cranial nerves II- XII grossly intact.  No focal neurologic deficits  PSYCHIATRIC: Alert and oriented x 3. Mood normal.

## 2022-08-07 ENCOUNTER — Ambulatory Visit
Admit: 2022-08-07 | Payer: MEDICARE | Attending: Student in an Organized Health Care Education/Training Program | Primary: Student in an Organized Health Care Education/Training Program

## 2022-08-07 MED ORDER — AMITRIPTYLINE 25 MG TABLET
ORAL_TABLET | Freq: Every evening | ORAL | 3 refills | 90 days
Start: 2022-08-07 — End: ?

## 2022-08-07 NOTE — Unmapped (Signed)
Refill request received from patient.      Medication Requested: Amitriptyline 25 mg   Last Office Visit: 09/28/2021   Next Office Visit: 09/21/2022  Last Prescriber: Dr. Daivd Council     Nurse refill requirements met? No  If not met, why: Drug-Drug interaction     Sent to: Provider for signing  If sent to provider, which provider?: Dr. Daivd Council

## 2022-08-07 NOTE — Unmapped (Signed)
Records reviewed. Patient was seen in ED for suicidal ideation and underwent IVC. I do not have an accurate medication list and there may be a potential medication interaction with a serotonergic drug. Will wait for face to face visit to reconcile medications and address need for this medication.

## 2022-08-08 DIAGNOSIS — M0579 Rheumatoid arthritis with rheumatoid factor of multiple sites without organ or systems involvement: Principal | ICD-10-CM

## 2022-08-08 MED ORDER — TRAMADOL 50 MG TABLET
ORAL_TABLET | Freq: Two times a day (BID) | ORAL | 0 refills | 30 days | PRN
Start: 2022-08-08 — End: ?

## 2022-08-08 NOTE — Unmapped (Signed)
Chart review shows patient was admitted in April 2024 with suicidal attempt with ingestion. Will defer refilling amitriptyline until further discussion. Last visit in August 2023 recommended 6 month follow-up. Will see patient in August 2024 at scheduled appointment.

## 2022-08-08 NOTE — Unmapped (Signed)
I am not the prescribing provider for this medication AND this patient missed his appointment on 08/07/2022

## 2022-08-14 NOTE — Unmapped (Signed)
The [facility] has made a second and final attempt to reach this patient to refill the following medication:SIMPONI 50 mg/0.5 mL Pnij pen injector (golimumab).      We have left voicemails on the following phone numbers: [phone] and have sent a text message to the following phone numbers: [phone] .    Dates contacted: 08/04/22 and 08/14/22  Last scheduled delivery: 05/18/22    The patient may be at risk of non-compliance with this medication. The patient should call the [facility] at [phone]  Option 4, then Option 2: Dermatology, Gastroenterology, Rheumatology to refill medication.    Craige Cotta   [facility] Specialty Technician

## 2022-08-30 MED ORDER — CYCLOBENZAPRINE 10 MG TABLET
ORAL_TABLET | Freq: Three times a day (TID) | ORAL | 1 refills | 0 days | PRN
Start: 2022-08-30 — End: ?

## 2022-08-31 ENCOUNTER — Ambulatory Visit
Admit: 2022-08-31 | Discharge: 2022-09-01 | Payer: MEDICARE | Attending: Student in an Organized Health Care Education/Training Program | Primary: Student in an Organized Health Care Education/Training Program

## 2022-08-31 DIAGNOSIS — F4310 Post-traumatic stress disorder, unspecified: Principal | ICD-10-CM

## 2022-08-31 DIAGNOSIS — Z87898 Personal history of other specified conditions: Principal | ICD-10-CM

## 2022-08-31 DIAGNOSIS — I619 Nontraumatic intracerebral hemorrhage, unspecified: Principal | ICD-10-CM

## 2022-08-31 DIAGNOSIS — Z789 Other specified health status: Principal | ICD-10-CM

## 2022-08-31 DIAGNOSIS — Z5986 Financial insecurity: Principal | ICD-10-CM

## 2022-08-31 DIAGNOSIS — Z7409 Other reduced mobility: Principal | ICD-10-CM

## 2022-08-31 MED ORDER — CYCLOBENZAPRINE 10 MG TABLET
ORAL_TABLET | Freq: Three times a day (TID) | ORAL | 1 refills | 30 days | PRN
Start: 2022-08-31 — End: 2023-08-31

## 2022-08-31 NOTE — Unmapped (Signed)
Suicide & Crisis Hotlines     Suicide and Crisis Lifeline  Dial: 988    The Hopeline   Phone: 620-112-2681    Online chat: www.hopeline.com    [employer]: 1-800-SUICIDE ((315)873-3858)    Crisis Text Line:  Text:  HOME to 737-634-9592    National Suicide Prevention Lifeline    Phone: 1-800-273-TALK ((775)484-0868)   Online chat: www.suicidepreventionlifeline.org    Online chat (ages 48-24): https://gibson.com/        How to Find Mental Health Treatment   If you have health insurance, look on your insurance card for the phone number or website to access a list of mental health providers who accept your insurance.      If you have Medicaid, Medicare or no insurance, see below for your county's LME/MCO. Call the phone number to schedule an appointment. There are several local mental health agencies listed below by county also.     Mobile Crisis   Crisis situations are often best resolved at home. Mobile Crisis Teams are available 24 hours a day in all counties. Professional counselors will speak with you and your family during a visit. They have an average response time of 2 hours.           [facility]  Website: www.[email or web address]  [address]  [place], [place] [address]  Phone: [phone]  Fax: [phone]  Crisis Line: [phone]  Counties Served: [place], [place], [place], [place], [place], [facility]        [place]---   [facility] at [facility]    Address: [address] [place], [place]    Phone: [phone]   Hours: M-W, Th-F 8am-3pm; Saturday and Sunday 8am-4pm   [phone] for 24/7       [facility] County---Therapeutic Alternatives  24/7 Mobile Crisis: [phone]    [facility] Response Center    Address: [address], [place]   Phone: [phone]    Hours: 24 hours a day    [employer]  24/7 Mobile Crisis: [phone]    [facility], [facility]   Address: [address] [place]. [place], [place] [address]   Phone: [phone]   Hours: M-F 8am-5pm    [place]ra [place]---Crisis ED  [address] [place], [place] [address]  Phone: [phone]    [facility] of [place]  [facility]  [id number] [place]  [place], [place] [address]  Phone: [phone]     [facility] Alternatives  24/7 Mobile Crisis: [phone]    [facility]  [address] [place], [place]  Phone: [phone]  Hours: M-F 8am-5pm     [place]--RHA  [address], [place]   Phone: [phone]    [facility]  [address] [place], [place]  Phone: [phone]    Leonie Douglas  24/7 Mobile Crisis: [phone]    [facility]   [address] [place], [place]   Phone: [phone] 24/7 Mobile Crisis: [phone] or [phone]  Hours: 24 hours    [facility] at [facility]   Address: [address] [place], [place]   Phone: [phone]   Hours: 24 hours    [facility] County---Therapeutic Alternatives  24/7 Mobile Crisis: [phone]             [facility] Resources Office  Website: www.[email or web address]  [address]  [place],  [place] [address]  Phone: [phone]  Crisis Line: [phone]  [place]: [place], [place], [place], [place], [place], [place], [place], [place], [place], [place], [place], [place], [place], [place], [place], [place], [place], [place], [place], [place], [place], [place], [place], [place], [place], [place], [place], [facility] County---DREAM  [address] [place], [place]  Phone: [phone]  Hours: M-F 8am-8pm    [facility] County---Integrated Family Services  24/7 Mobile Crisis: [phone]    [facility]  [address]  [place], [place] [address]  [phone]    [facility]  [address]  [place], [place] [address]  [phone]    [facility]  [phone]    [facility] County---Integrated Family Services  24/7 Mobile Crisis: [phone]    [facility] County---CommWell Health of [place]  [phone] [address] [place] [address]  Phone: [phone]     [place]---RHA  [address] [place], [place]  Phone: [phone] (Also Mobile Crisis)  Hours: M-F 8am-5pm    [place]--Helping Hand of [place]  [address] [place] [address] [place],  [place], [place]   Phone: [phone]   Hours: M-F 8-5    [facility]  [address] [place], [place]  Phone: [phone]  Hours: M-F, 8am to 3pm    [facility] [place]--[employer]  [address], [place], [place]  Phone: [phone]  Hours: M, T, Th, F 8am to 3pm    [place] Crisis  [phone]    [facility] County---Integrated Family Services  24/7 Mobile Crisis: [phone]    [facility]  [address]  [place], [place] [address]  [phone]    [place]---RHA  [address] [place], [place]  Phone: [phone] / 24/7 Mobile Crisis: [phone]  Hours: M-F 8am-5pm    Wandra Arthurs [phone]    [facility] County---Integrated Family Services  24/7 Mobile Crisis: [phone]    [facility] County--Integrated Family Services   24/7 Mobile Crisis: [phone]    [facility]  [address] [place], [place]  Phone: [phone] / 24/7 Mobile Crisis: [phone]  Hours: M-F [place]--- [phone] for mobile crisis    [place]---PORT  [address] [place], [place]   Phone: [phone]  Hours: M-F 8am-5pm    [facility]  24/7 Mobile Crisis: [phone]    [place] - Vernetta Honey [phone]    [place]---Integrated Family Services  24/7 Mobile Crisis: [phone]    [place]---PORT  [facility]  [address]  [place], [place] [address]  [phone]    [place]---Integrated Family Services  24/7 Mobile Crisis: [phone]    Jasmine Awe [phone]    [facility] County---Integrated Family Services  24/7 Mobile Crisis: [phone]    [facility] County---RHA   [address] [place], [place]  Phone: [phone]  Hours: T, W, F 8:30am to [facility] Recovery Services  24/7 Mobile Crisis: [phone]    [place] Demetrio Lapping [phone]    [facility] - PORT  [facility]  [address]  [place], [place] [address]  [phone]    [facility]  [address]  [place], [place] [address]  [phone]    [place]--Integrated Family Services  24/7 Mobile Crisis:  [phone]     Sammuel Hines [phone]    [facility] County---Integrated Family Services  24/7 Mobile Crisis: [phone]    Glee Arvin [phone]    [facility]  24/7 Mobile Crisis: [phone]    Karna Christmas [phone]    [facility] County---Integrated Family Services  24/7 Mobile Crisis: [phone]        Burt Ek  [address], [place], [place]  Phone: [phone]  Hours: M-F 8am to 1pm    [facility] Family Services  24/7 Mobile Crisis Phone: [phone]    Peyton N[facility] UCP  24/7 Mobile Crisis Phone: [phone]    [facility] County--Helping Hands of [place]  [address], [place]  [phone]  M-F 8-5    [facility] of [facility]  [address],  [place], [place] [address]  Phone (Suite [address]): [phone]  Phone (Suite 212): [phone]     [facility] Crisis  [phone]    Temple Pacini [phone]     [facility] [place] County---Integrated Family Services  24/7 Mobile Crisis: [phone]    [facility]  [address] [place], [place]   Phone: [phone]    Janina [place] of [facility]  [address]  [place], [place] [address]  Phone: [phone]    [facility] Counseling  [address]  [phone]    [facility] Crisis  [phone]    Lauree Chandler [phone]    [facility] Services  24/7 Mobile Crisis: [phone]        [place]---PORT  [facility]  [address]  [place], [place] [address]  [phone]    [facility]- Pride of [place]  [facility]  [address] [place], [place] [address]  Phone: [phone]     [place]---Integrated Family Services  Phone: [phone]    [facility]  [address] [place], [place]  Phone: [phone]  Hours: M-F 8am to 5pm    [facility] County--RHA   24/7 Mobile Crisis   [phone]    [facility] County---Integrated Family Services  24/7 Mobile Crisis: [phone]    [facility] - PORT Health (Mental Health and Substance Use)  [facility]  [address]  [place], [place] [address]  [phone]    [facility]   [address]  [place], [place] [address]  [phone]    [facility]  [address]  [place], [place] [address]  [phone]    [facility] - Pride of [facility]  [address],  [place], [place] [address]  Phone: [phone]     [place]---Integrated Family Services OPEN ACCESS CLINIC   Monday 12 pm - 5 pm   [place] location only Tuesday - Friday 8:00 am - 12:00 pm   Phone [phone]     [facility] County---Integrated Family Services  24/7 Mobile Crisis: [phone]    Jan Fireman  [phone]    [place]---Integrated Family Services  24/7 Mobile Crisis: [phone] Vernetta Honey [phone]    [facility] County---Integrated Family Services  24/7 Mobile Crisis: [phone]          [facility] Office  Website: [email or web address]  [address]  [place], [place] [address]  Phone: [phone]  Fax: [phone]  Crisis Line: [phone]  Counties Served: [place], [place],  Candi Leash, [place], [place], [place], Laretta Bolster       [facility] County---[employer]ension Group-- [facility]  Phone: [phone]  Monday - Friday: 8:00 - 5:00 pm.  Saturday: Closed except by appointment  Sunday: Closed    [place]/ [place]---Easter Seals UCP  24/7 Mobile Crisis: [phone]     Melina Copa  [address] [place], [place]  Phone: [phone]   Hours: M-F 8am-3pm    [place]---PORT Health Services - Hosp [facility] (Mental Health and Substance Use)  [address], [place], [place]  Phone: [phone]  Hours: M-F 8am-5pm    [facility]  [address] [place], [place]  Phone: [phone]   Hours: M-F 8am-3pm (walk-ins welcome)    [facility] County--RHA Behavioral  [address] [place], [place]   Phone: [phone]  Hours: M, W, F 8am to 11am        [facility] County---CommWell Health Building [facility] Transitional [facility] & Rehabilitation  [address] [place], [place] [address]  [facility] States [address]  Phone:[phone]    [place]---[facility]arch  [address] [place], [place]  Phone: [phone]  Hours: M-F 8am-3pm    [place]  [address], [place]  Phone: [phone]    [facility]  [address] [place], [place]  Phone: [phone]  Hours: M-TH 8am-8pm ; F- 8-6.    [facility]  [address] [place], [place]  Phone: [phone]  Hours: M-F 8am-3pm    [facility] Pride of [place]  [facility]  [address]  [place], [place] [address]  [phone]          [facility] Office  Website: [email or web address]  [address]  [place], [place] [address]  Phone: [phone]  Fax: [phone]  Crisis Line: [phone]  Counties Served: Cathie Beams, [place], [place], [place], [place], [place], [place], [place], [place], [place], Ena Dawley, Maia Petties, Rocky [facility]  [address] [place], [place] [address]  Phone: [phone] / 24/7 Mobile Crisis: [phone]  Hours: M-F 9am-5pm      [facility] Recovery Services   [address] Laurell [address] [place], [place]  [phone]    24/7 Mobile Crisis: [phone]    Suicide Prevention Hotline: [phone]  [facility]  [address] [place], [place]  Phone: [phone]   24/7 Mobile Crisis: [phone]  Hours: M-F 9am-3pm    [place]---[facility]  [address]  [place], [place]  Phone: [phone]  Hours: M-F 8am-3pm    [place]---[facility] Counseling and Crisis Centers  [address] [place]  Phone: [phone]  24/7 Mobile Crisis: [phone]    [facility] Recovery Services  [address] [place], [place]  Phone: [phone]  Margorie John, F 8am to 12pm    [facility] Recovery Services  24/7 Mobile Crisis: [phone]    [facility] Recovery  [address] [facility] [address], [place], [place]  Phone: [phone]   24/7 Mobile Crisis: [phone]    Suicide Prevention Hotline: (224)189-3865  Hours: M-F 8am-5pm    Jamison Oka  [address] [place], [place]  Phone: [phone]  Hours: M-F 8am-3pm     Leonette [place] County---[facility] Counseling and Crisis Centers  [address], [place]  Phone: [phone]  24/7 Mobile Crisis: [phone]    [facility]indy C[facility] Recovery Services  [address]  [place], [place] [address]  Phone: [phone]   Mobile Crisis: [phone]    Suicide Prevention Hotline: 567 888 0799    [facility]  [address] [place], [place]  Phone: [phone]  Hours: M-F 8am-3pm    [facility] County---[facility]ho[place] Counseling and Crisis Centers  24/7 Mobile Crisis: [phone]    [place] [facility] Preservation Services  [address] [place], [place]  Phone: [phone]  Hours: M-F 9a-5p    [facility] Recovery Services  [address] [place], [place]  Phone: [phone]   Mobile Crisis: [phone]    Suicide Prevention Hotline: 407-405-3141    [facility] Recovery Services  [address]  [place]. [place], [place] [address]  Phone: [phone]   Mobile Crisis: [phone]    Suicide Prevention Hotline: (302) 145-9430    [facility] Recovery Services  [address] [place]. [place], [place]   Phone: [phone]   Mobile Crisis: [phone]    Suicide Prevention Hotline: 602-479-7527    [facility] Recovery Services  [address] [place], [place]  Phone: [phone]   24/7 Mobile Crisis: [phone]    Suicide Prevention Hotline: (831)127-6425          [facility] Office  Website: [email or web address]  [address]  [place], [place] [address]  Phone: [phone]  Fax: [phone]  Crisis Line: [phone]  [facility]: Thomasenia Sales, [place], [place], [place], Clare [place], [place]        [facility] Recovery Services  [address] [place], [place]   Phone: [phone]   Mobile Crisis: [phone]   Suicide Prevention Hotline: (985)792-0315    Neomia Glass Alternatives (24/7 Mobile Crisis)  [phone]        [facility]rea [facility]cat Recovery Services  [address] [place]  Phone: [phone]   24/7 Mobile Crisis: [phone]  Mobile Crisis: [phone]    Suicide Prevention Hotline: 3373605646  Hours: M-F 8am-8pm    [place]---[facility]  [address] [place], [place]   Phone: [phone]  Hours: M-F 8am-3pm    [place]--Family Service of the [place]  [address], [place]  Phone: [phone]  Hours: M-F 8am to 3pm    [place]--RHA  [address] [place], [place]  Phone: [phone]  Hours: M-F 8am to 3pm    [place]---Therapeutic Alternatives  24/7 Mobile Crisis: [phone]    [facility] Recovery Services  [address] [place], [place]  Phone: [phone]  Mobile Crisis: [phone]    Suicide Prevention Hotline: 825-204-1227  Hours: M-F 8am-3pm    [facility] County---Therapeutic Alternatives  24/7 Mobile Crisis: [phone]    [facility] Recovery Services  [address] [place], [place]  Phone: [phone]  Mobile Crisis: [phone]    Suicide Prevention Hotline: 313-238-7048  Hours: M-F 8am-3pm    [facility] County---Therapeutic Alternatives  24/7 Mobile Crisis: [phone]    [facility] Recovery Services  [address] [place], [place]  Phone: [phone]  Mobile Crisis: [phone]    Suicide Prevention Hotline: (413)818-9454  Hours: M-F 8am-3pm    Coralyn Mark Alternatives  24/7 Mobile Crisis: [phone]    [facility] Recovery Services  [address] [place], [place]  Phone: [phone]  Mobile Crisis: [phone]    Suicide Prevention Hotline: 662-368-9819  Hours: M-F 8am-3pm    [place]---Therapeutic Alternatives  24/7 Mobile Crisis: [phone]    [facility] Recovery Services  [address] [place], [place]  Phone: [phone]  Mobile Crisis: [phone]   Suicide Prevention Hotline: 906-353-8056  Hours: M-F 8am-3pm    [place]---Therapeutic Alternatives  24/7 Mobile Crisis: [phone]    [facility] Recovery Services  [address] [place], [place]  Phone: [phone]  Mobile Crisis: [phone]    Suicide Prevention Hotline: (262) 866-4973  Hours: M-F 8am-3pm    [facility] Recovery Services  [address] [place], [place]  Phone: [phone]  Mobile Crisis: [phone]    Suicide Prevention Hotline: (224)520-5358  Hours: M-F 8am to 3pm    [facility] County---Therapeutic Alternatives  24/7 Mobile Crisis: [phone]    [facility] Recovery Services  [address] [place] [address],  [place], [place]  Phone: [phone]  Mobile Crisis: [phone]    Suicide Prevention Hotline: 367-600-3369  Hours: M-F 8am-3pm    [place]---Therapeutic Alternatives  24/7 Mobile Crisis: [phone]    [facility] Recovery Services  [address] [place], [place]  Phone: [phone] / 24/7 Mobile Crisis: [phone]  Hours: M-F 8am-3pm            [facility] Recovery Services  [address], [place], [place] [address]  Phone: [phone]  Mobile Crisis: [phone]    Suicide Prevention Hotline: (214) 093-5891          [facility] Health  Website: www.[email or web address]  [address]  [place], [place] [address]  Phone: [phone]  Fax: [phone]  Crisis Line: [phone]  [place]: Theba, Lyn Hollingshead, Scientist, physiological, [place], [place], [place], [place], [place], [place], [place], [place], [place], [place], [place], [place], [place], [place], [place], [place], [place], [place], [place], [place], [place], [place]        [place]---RHA  [address] [place], [place]  Phone: [phone]  Hours: M,W,F 8am to 3pm and Walk-in crisis center 7 days a week 8am-midnight.     Runner, broadcasting/film/video (24/7 Mobile crisis)  [phone]    [facility]  [address] [place],  [place], [place]  Phone: [phone]   24/7 Mobile Crisis: [phone]  Hours: M-F 8am-5pm    [facility] Recovery Services  [address] [place], [place]  Phone: [phone]   24/7 Mobile Crisis: [phone]   Mobile Crisis: [phone]    Suicide Prevention Hotline: 908 619 7485  Hours: M-F 8am-5pm    [facility] Recovery Services  [address] [place], [place]  Phone: [phone] Mobile Crisis 24/7: [phone]    Suicide Prevention Hotline: (952)695-3858  Hours: M-F 8am-5pm    Denny Peon [facility] Recovery Services  [address] [place], [place]  Phone: [phone]   Mobile Crisis 24/7: [phone]   Suicide Prevention Hotline: (252)041-4142  [facility] Preservation Services  [address], [place], [place]  Phone: [phone]  Hours: M-F 8am-5pm    [facility]  [address] [place], [place]  Phone: [phone]  Hours: M-F 8:30am to 5:30pm  Crisis: [phone]    [facility]  [address], [place], [place]  Phone: [phone]   24/7 Mobile Crisis: [phone]  Hours: M-F 8am-5pm    [place]---Psychotherapeutic Services  24/7 Mobile Crisis: [phone]    [facility] Recovery Services  [address] [place], [place]   Phone: [phone],   Mobile Crisis: [phone]    Suicide Prevention Hotline: 610-074-4798    [facility] Alternatives  24/7 Mobile Crisis: [phone]    [facility] County---NCG/Appalachian Community Services  [address] [place], [place]  Phone: [phone]   24/7 Mobile Crisis: [phone]  Hours: M-F  9am-5pm    [facility]  [address] [place], [place]  Phone: [phone]   24/7 Mobile Crisis: [phone]  Hours: M-F 9am-5pm    [place]---[facility]  [address], [place], [place]  Phone: [phone]    [facility] Recovery Services  24/7 Mobile Crisis: [phone]    [facility]  [address] [place], [place]  Phone: [phone]   24/7 Mobile Crisis: [phone]  Hours: M-F 9am-5pm    [place] - [place] Road Flat: [phone]  No John Giovanni [facility] Recovery Services  24/7 Mobile Crisis: [phone]    [facility] County---NCG/[place]ian [facility]  [address] [place], [place]  Phone: [phone]   24/7 Mobile Crisis: [phone]  Hours: M-F 9am-5pm    [facility] ( adults only)  [address] [place], [place]  Phone: [phone]  Hours: 8:30am to 5pm    [facility] (Child and Family only)   [address], [place]   [phone]    [facility]  [address] [place], [place]  Phone: [phone]  Hours: M-F 9am-1pm    [facility]  24/7 Mobile Crisis: [phone]    [facility]  [address] [place], [place]  Phone: [phone]  Hours: M-F 9am-5pm    [facility]  24/7 Mobile Crisis: [phone]    [facility]  [address], [place], [place]  Phone: [phone]   24/7 Mobile Crisis: [phone]  Hours: M-F 9am-5pm    [facility]  [address] [place], [place]  Phone: [phone]  Hours: M-F 9a-5p    [place]  24/7 Mobile Crisis: [phone]  Hours: M-F 8am-5pm    [facility]  [address] [place], [place]   Phone: [phone]  24/7 Mobile Crisis: [phone]  Hours: M-F 8am-5pm    [facility]  [address] [place], [place]  Phone: [phone]  24/7 Mobile Crisis: [phone]    [facility]--- CALL [phone] (No Walk-in)  24/7 Mobile Crisis: [phone]    [facility]  [address] [place], [place]   Phone: [phone]  Hours: M-F 9am-5pm    [facility]  24/7 Mobile Crisis: [phone]    [facility]  [address] [place], [place]  Phone: [phone]   24/7 Mobile Crisis: [phone]    Suicide Prevention Hotline: (450) 440-3657  Hours: M-F 8am-8pm    [place]  [address] [place], [place]  Phone: [phone]  Hours: M-F 9a-5p    [place]  24/7 Mobile Crisis: [phone]    [facility]  [address] [place], [place]  Phone: [phone]  Hours: Margorie John 11:30am to 1:30, F 8am to 12pm  Mobile Crisis: [phone]    Suicide Prevention Hotline: (516)167-4019    [facility]  [address] [place], [place]  Phone: [phone]  24/7 Mobile Crisis: [phone]  Hours: M-F 9am-5pm    [facility]  [address], [place]  Phone: [phone]  Hours: M-F 9a to 5p    [facility]  24/7 Mobile Crisis: [phone]            [facility]  [address] [place] [place], [place]  Phone:  [phone]  Mobile Crisis: [phone]    Suicide Prevention Hotline: 909-160-8910    [facility]  [address] [place], [place]  Phone: [phone]    [facility]  [address], [place], [place]  Phone: [phone]  24/7 Mobile Crisis: [phone]    Suicide Prevention Hotline: 407-885-0740    [facility]  [address] [place], [place]  Phone: [phone]  24/7 Mobile Crisis: [phone]    Suicide Prevention Hotline: (662)210-3223     [facility]  [address] [place], [place]  Phone: [phone]  24/7 Mobile Crisis: [phone]  Hours: M-F 9a-5p

## 2022-08-31 NOTE — Unmapped (Signed)
[place] Assessment of Medications Program (CAMP)  RECRUITMENT SUMMARY NOTE   Workqueue Referral (Unscheduled)      Patient was referred for CAMP services. Patient has issues with getting to the pharmacy to fill prescriptions. I called the pharmacy and all medications have already been picked up        Jerolyn Center, CPhT   Certified Pharmacy Technician   [place] Assessment of Medications Program (CAMP)   P [phone], F [phone]

## 2022-08-31 NOTE — Unmapped (Signed)
Paul Hsu Sr.  31-May-1965    Assessment & Plan:  Financial insecurity  -     Ambulatory Referral to [place] Assessment of Medication Program (CAMP) Visit; Future  -     [facility] Request; Future  -     Ambulatory Referral to Case Management; Future    History of substance use  -     Ambulatory Referral to [place] Assessment of Medication Program (CAMP) Visit; Future  -     [facility] Request; Future  -     Ambulatory Referral to Case Management; Future    PTSD (post-traumatic stress disorder)    Intraparenchymal hemorrhage of brain (CMS-HCC)  Overview:  Right thalamic infacrt (06/01/2021), Right Basal Ganglia IPH (06/2021)      Impaired mobility and ADLs    Moderate episode of recurrent major depressive disorder (CMS-HCC)    Marijuana smoker    Intentional drug overdose, sequela (CMS-HCC)    At risk for falls    ------------------------  57 yo male presents today for multiple concerns.   Clinic coordinator and Engineer, manufacturing were present for majority of office visit.     -Since our last visit, he has been seen in the ER multiple times   ---05/13/2022: concern for new stroke. He was offered transfer to [facility] but declined   ---05/19/2022: Confirmed new infarct. He presented to the hospital to get a sooner appointment with neurology. Plavix was stopped  ---05/27/2022: Chest pain, pleurisy  ---06/08/2022 to 06/19/2022: Admitted for Drug overdose, AMS  ---06/23/2022: admitted for suicidal ideation  -P-4 Acute suicide risk level: higher. The patient HAS had passive SI previously and recent Drug overdose.   Suicidal ideation was assessed: Patient endorses passive suicidal ideation, denies current intent to self-harm. Suicide risk factors include divorced, separated, widowed, recent onset of serious medical condition, current substance abuse, current treatment non-compliance, and current mental health or personality disorder. Mitigating factors include lack of active SI/HI, motivation for treatment, expresses purpose for living, and presence of a safety plan with follow-up care. After risk assessment, measures taken include Explored patient's reasons for living, Discussed alternatives, Contracted for safety, Crisis numbers provided, and Advised evaluation in the Emergency Department..     -He reported today that he denies active or passive SI/HI at this time. He reports feeling safe at home. He stated multiple times that he just needs assistance and declined going to the emergency room. He verbalized this multiple times in front of multiple different staff members.   -Regarding services, He was provided with multiple resources:   -----Multiple crisis resources were provided to him today  -----Verbal safety agreement was made with patient today.   -----Referral Placed to CAMP to assist him with obtaining his medications. He will also need thorough med rec performed  -----EMERGENT referral placed to COMMUNITY HEALTH REQUEST/CASE MANAGEMENT to help assist with provide patient with resources  -----Patient verbalized that he does utilize services offered by the [place] as well. Advised to continue to utilize these resources.  -We discussed in depth that I advised AGAINST driving at this time and would not provide him with documentation clearing him to drive. He has worsening left sided motor deficits from his more recent stroke in Late March/Early April. At this time, it is NOT recommended that he drive.     He is to follow up in ONE week.     I personally spent >40 minutes face-to-face and non-face-to-face in the care of this patient, which includes all pre, intra,  and post visit time on the date of service.  All documented time was specific to the E/M visit and does not include any procedures that may have been performed.      Subjective:  Subjective    HPI: Paul Duyck Sr. is a 57 y.o. male presented to the clinic today without an appointment.   He walked in, requesting for help and to be seen.     He presents alone today and states that he broke up with his Girlfriend in may.   He reports the following   -That he spent a month in the [place] for overdosing on his amitriptyline   -He denies any SI at this time but notes he needs help   -He has started to drink again (beer. Last drink was 2 days ago. He reports that he is not drinking liquor)  -He continues to smoke marijuana daily as well as cigars  -He states that he has gotten stronger and wants to become more independent  -He wants to drive again    He has not taken his Methotrexate for some time as well as has not had the injection he needs for his RA as well as has not had his tramadol    He reports that he still checks his BG at home, mainly in the AM with it ranging from 100-110s    -He spent a month in the hospital in [place].   -He broke up with his Girlfriend in may     ROS:  Review of systems negative unless otherwise noted as per HPI.    Objective:  Objective    Vitals:    08/31/22 1156   BP: 127/89   Pulse: 106   SpO2: 95%     There is no height or weight on file to calculate BMI.    Physical Exam:  Physical Exam  Constitutional:       General: He is not in acute distress.     Appearance: He is not ill-appearing, toxic-appearing or diaphoretic.   Pulmonary:      Effort: Pulmonary effort is normal.   Musculoskeletal:      Comments: Steppage gait present on exam. He has his left arm in a sling today. He has poor grip strength and ROM regarding his left arm.   He is able to move himself in a manual wheelchair   Neurological:      Mental Status: He is oriented to person, place, and time.   Psychiatric:         Attention and Perception: He is attentive. He does not perceive auditory or visual hallucinations.         Mood and Affect: Mood is depressed. Affect is tearful. Affect is not angry.         Speech: Speech is slurred (dysarthria slightly worse from prior).         Behavior: Behavior is not aggressive or hyperactive. Behavior is cooperative.      Comments: He is very tearful today and somewhat difficult to redirect.

## 2022-08-31 NOTE — Unmapped (Signed)
Hey he his here in the clinic. he states that the person that was helping him is no longer helping him. He has [place]. he has no way of getting what he needs. He is having issues with getting his medications filled from his rheumatologist and is in need of help. Please advise

## 2022-08-31 NOTE — Unmapped (Signed)
[place] Assessment of Medications Program (CAMP)  Referral RECRUITMENT SUMMARY NOTE     Patient was outreached for CAMP Services. Unable to contact- left message.    CMM/AFFORDABILTY     Jerolyn Center, CPhT  Certified Pharmacy Technician   [place] Assessment of Medications Program (CAMP)   P [phone], F [phone]

## 2022-09-01 NOTE — Unmapped (Signed)
[employer] Assessment of Medications Program (CAMP)  RECRUITMENT SUMMARY NOTE   Workqueue Referral (Unscheduled)      Patient was referred for CAMP services.  Called patient to discuss affordability concerns. Patient is receiving medicaid benefits so he is automatically signed up for Extra help. Patient is working on getting medications switched to [facility] to see if they can ship his medications. Referral was placed by provider to community health to help with transportation barrier.     Medicare Extra Help:  Extra Help with Medicare Prescription Drug Plan Costs - SSA   https://price.info/  4-403-474-2595             Alberteen Sam, CPhT   Certified Pharmacy Technician   [place] Assessment of Medications Program (CAMP)   P [phone], F [phone]

## 2022-09-04 ENCOUNTER — Ambulatory Visit: Admit: 2022-09-04 | Discharge: 2022-09-05

## 2022-09-04 DIAGNOSIS — Z5986 Financial insecurity: Principal | ICD-10-CM

## 2022-09-04 DIAGNOSIS — Z87898 Personal history of other specified conditions: Principal | ICD-10-CM

## 2022-09-04 NOTE — Unmapped (Signed)
COMMUNITY HEALTH WORKER  Outreach Note    09/04/2022  Inform/Regulatory Low priority, no response needed unless change in care.     I am reaching out to Paul Hsu Sr. as part of the community health worker team regarding his Resource Coordination.    Patient advised to  reach out to [employer] regarding transportation  .      Next appt with PCP: N/a     See chart for med list if needed    Sharing communication as part of regulatory requirements.       Referral:    Referral received from: Benison Pap Mangel   Reason for referral: Resource Coordination    Assessment:    Patients Primary Concern: Transportation barriers  Patient Barriers:  Wheel Chair no Car   Patient Strengths: Self-advocacy    Additional Background Information provided by patient: Spoke to Patient who states he does need transportation resources . Advised patient to reach out to medical insurance. Mr Schorsch states his medical insurance isn't going to help. [employer] Worker questioned if this was stated by his health insurance , patient states no. Patient agreed to give them a call. Patient did mention the issue with his medications , his provider gave resources ,he hasn't had the time to call yet. Patient states he will call this week. [employer] Worker will follow up with patient regarding transportation. Patient did not mention any other resources that were needed during conversation when asked.      Food Insecurity: No Food Insecurity (06/19/2021)    Hunger Vital Sign     Worried About Running Out of Food in the Last Year: Never true     Ran Out of Food in the Last Year: Never true     Transportation Needs: No Transportation Needs (07/01/2021)    PRAPARE - Therapist, art (Medical): No     Lack of Transportation (Non-Medical): No     Financial Resource Strain: Low Risk  (06/19/2021)    Overall Financial Resource Strain (CARDIA)     Difficulty of Paying Living Expenses: Not very hard Housing/Utilities: Low Risk  (06/19/2021)    Housing/Utilities     Within the past 12 months, have you ever stayed: outside, in a car, in a tent, in an overnight shelter, or temporarily in someone else's home (i.e. couch-surfing)?: No     Are you worried about losing your housing?: No     Within the past 12 months, have you been unable to get utilities (heat, electricity) when it was really needed?: No     Health Literacy: Low Risk  (07/01/2021)    Health Literacy     : Never      Social Connections: Unknown (08/29/2017)    Received from [facility]    Social Connection and Isolation Panel [NHANES]     Frequency of Communication with Friends and Family: Patient declined     Frequency of Social Gatherings with Friends and Family: Patient declined     Attends Religious Services: Patient declined     Database administrator or Organizations: Patient declined     Attends Banker Meetings: Patient declined     Marital Status: Divorced          I provided an intervention for the Transportation Needs SDOH domain. The intervention was Provided community resources, Education, and Supportive listening     Patient expressed understanding.    Next Follow-up: Next scheduled  call is 09/26/22    Note Routed: Yes.  Routed [id number], Benison Pap, DO     Signed: Paulla Fore, CNA

## 2022-09-07 ENCOUNTER — Ambulatory Visit
Admit: 2022-09-07 | Discharge: 2022-09-08 | Payer: MEDICARE | Attending: Student in an Organized Health Care Education/Training Program | Primary: Student in an Organized Health Care Education/Training Program

## 2022-09-07 DIAGNOSIS — Z789 Other specified health status: Principal | ICD-10-CM

## 2022-09-07 DIAGNOSIS — Z9181 History of falling: Principal | ICD-10-CM

## 2022-09-07 DIAGNOSIS — L738 Other specified follicular disorders: Principal | ICD-10-CM

## 2022-09-07 DIAGNOSIS — D509 Iron deficiency anemia, unspecified: Principal | ICD-10-CM

## 2022-09-07 DIAGNOSIS — E119 Type 2 diabetes mellitus without complications: Principal | ICD-10-CM

## 2022-09-07 DIAGNOSIS — R7989 Other specified abnormal findings of blood chemistry: Principal | ICD-10-CM

## 2022-09-07 DIAGNOSIS — Z7409 Other reduced mobility: Principal | ICD-10-CM

## 2022-09-07 DIAGNOSIS — Z8673 Personal history of transient ischemic attack (TIA), and cerebral infarction without residual deficits: Principal | ICD-10-CM

## 2022-09-07 DIAGNOSIS — G2581 Restless legs syndrome: Principal | ICD-10-CM

## 2022-09-07 DIAGNOSIS — Z5986 Financial insecurity: Principal | ICD-10-CM

## 2022-09-07 DIAGNOSIS — E1165 Type 2 diabetes mellitus with hyperglycemia: Principal | ICD-10-CM

## 2022-09-07 DIAGNOSIS — Z5181 Encounter for therapeutic drug level monitoring: Secondary | ICD-10-CM | POA: Diagnosis not present

## 2022-09-07 DIAGNOSIS — M069 Rheumatoid arthritis, unspecified: Secondary | ICD-10-CM | POA: Diagnosis not present

## 2022-09-07 DIAGNOSIS — F4310 Post-traumatic stress disorder, unspecified: Secondary | ICD-10-CM | POA: Diagnosis not present

## 2022-09-07 DIAGNOSIS — F331 Major depressive disorder, recurrent, moderate: Secondary | ICD-10-CM | POA: Diagnosis not present

## 2022-09-07 LAB — COMPREHENSIVE METABOLIC PANEL
ALBUMIN: 3.5 g/dL (ref 3.4–5.0)
ALKALINE PHOSPHATASE: 163 U/L — ABNORMAL HIGH (ref 46–116)
ALT (SGPT): 14 U/L (ref 10–49)
ANION GAP: 7 mmol/L (ref 5–14)
AST (SGOT): 20 U/L (ref <=34)
BILIRUBIN TOTAL: 0.7 mg/dL (ref 0.3–1.2)
BLOOD UREA NITROGEN: 7 mg/dL — ABNORMAL LOW (ref 9–23)
BUN / CREAT RATIO: 9
CALCIUM: 9.5 mg/dL (ref 8.7–10.4)
CHLORIDE: 104 mmol/L (ref 98–107)
CO2: 25 mmol/L (ref 20.0–31.0)
CREATININE: 0.78 mg/dL
EGFR CKD-EPI (2021) MALE: >90 mL/min/{1.73_m2} (ref >=60)
GLUCOSE RANDOM: 138 mg/dL (ref 70–179)
POTASSIUM: 4.4 mmol/L (ref 3.5–5.1)
PROTEIN TOTAL: 7.6 g/dL (ref 5.7–8.2)
SODIUM: 136 mmol/L (ref 135–145)

## 2022-09-07 LAB — IRON & TIBC
IRON SATURATION: 17 % — ABNORMAL LOW (ref 20–55)
IRON: 49 ug/dL — ABNORMAL LOW
TOTAL IRON BINDING CAPACITY: 284 ug/dL (ref 250–425)

## 2022-09-07 LAB — HEMOGLOBIN A1C
ESTIMATED AVERAGE GLUCOSE: 117 mg/dL
HEMOGLOBIN A1C: 5.7 % — ABNORMAL HIGH (ref 4.8–5.6)

## 2022-09-07 LAB — CBC W/ AUTO DIFF
BASOPHILS ABSOLUTE COUNT: 0.1 10*9/L (ref 0.0–0.1)
BASOPHILS RELATIVE PERCENT: 0.7 %
EOSINOPHILS ABSOLUTE COUNT: 0.1 10*9/L (ref 0.0–0.5)
EOSINOPHILS RELATIVE PERCENT: 1.2 %
HEMATOCRIT: 42.5 % (ref 39.0–48.0)
HEMOGLOBIN: 14.9 g/dL (ref 12.9–16.5)
LYMPHOCYTES ABSOLUTE COUNT: 2.6 10*9/L (ref 1.1–3.6)
LYMPHOCYTES RELATIVE PERCENT: 33.9 %
MEAN CORPUSCULAR HEMOGLOBIN CONC: 35.1 g/dL (ref 32.0–36.0)
MEAN CORPUSCULAR HEMOGLOBIN: 30.5 pg (ref 25.9–32.4)
MEAN CORPUSCULAR VOLUME: 87 fL (ref 77.6–95.7)
MEAN PLATELET VOLUME: 7.6 fL (ref 6.8–10.7)
MONOCYTES ABSOLUTE COUNT: 0.6 10*9/L (ref 0.3–0.8)
MONOCYTES RELATIVE PERCENT: 7.3 %
NEUTROPHILS ABSOLUTE COUNT: 4.3 10*9/L (ref 1.8–7.8)
NEUTROPHILS RELATIVE PERCENT: 56.9 %
PLATELET COUNT: 356 10*9/L (ref 150–450)
RED BLOOD CELL COUNT: 4.88 10*12/L (ref 4.26–5.60)
RED CELL DISTRIBUTION WIDTH: 14 % (ref 12.2–15.2)
WBC ADJUSTED: 7.6 10*9/L (ref 3.6–11.2)

## 2022-09-07 LAB — FERRITIN: FERRITIN: 68.6 ng/mL

## 2022-09-07 MED ORDER — TRIAMCINOLONE ACETONIDE 0.1 % TOPICAL CREAM
Freq: Two times a day (BID) | TOPICAL | 0 refills | 0 days | Status: CP
Start: 2022-09-07 — End: 2022-12-06

## 2022-09-07 MED ORDER — ATORVASTATIN 80 MG TABLET
ORAL | 0 refills | 180 days | Status: CP
Start: 2022-09-07 — End: 2023-03-06

## 2022-09-07 MED ORDER — CYCLOBENZAPRINE 10 MG TABLET
ORAL_TABLET | Freq: Three times a day (TID) | ORAL | 2 refills | 10 days | Status: CP | PRN
Start: 2022-09-07 — End: 2022-09-17

## 2022-09-07 NOTE — Unmapped (Signed)
Assessment and Plan:     57 yo male presents for follow up    Moderate episode of recurrent major depressive disorder (CMS-HCC), Posttraumatic stress disorder  Chronic but worsening. Previous psychiatric hospitalization: in 2022.   -He had recent ER/Hospitalizations for drug overdose (06/08/22 to 06/19/2022) and Suicide Ideation (06/23/2022)  -He does continue to have passive SI but denies active SI, plan or intent at this time  -He did have intentional drug overdose with Amitriptyline and Flexeril during his hospitalization in April 2024. Given this, We will remove Amitriptyline from medication list  -He was previously recommended to Start Seroquel; however, he does not want to do. He does not want to start a medication at this time.   -Continue to closely monitor, he DOES have access to crisis resources/lines as well as has resources through the [place]      Type 2 diabetes mellitus with hyperglycemia, without long-term current use of insulin (CMS-HCC), Diabetes mellitus without complication (CMS-HCC)  -Last A1C was 5.6% on 04/11/22  -Currently taking: Metformin 500mg  BID  -Rechecking A1C  -     Hemoglobin A1c  -Will need to discuss when his last eye exam was.  -Foot exam due 11/2022, Repeat Urine Microalbumin 03/2022  -Refill provided on:  -     atorvastatin (LIPITOR) 80 MG tablet; Take 1 tablet (80 mg total) by mouth every evening.    Abnormal LFTs: Repeating labs: Comprehensive Metabolic Panel    Iron deficiency anemia, unspecified iron deficiency anemia type: Repeating labs:  CBC w/ Differential, Ferritin,  Iron & TIBC    Restless leg syndrome  -He notes he is having worsening symptoms of RLS. This could be secondary to IDA, obtaining labs listed above   -Will provide very short course of Flexeril. We will have to discuss increasing his Gabapentin to control his symptoms and stop Flexeril to decrease pill burden  -     cyclobenzaprine (FLEXERIL) 10 MG tablet; Take 1 tablet (10 mg total) by mouth Three (3) times a day as needed for up to 10 days.      History of stroke, Right Vertebral Artery Occulusion, Impaired mobility and ADLs, At risk for falls  -hx of Right basal ganglia IPH while on DAPT for Right Thalamic Ischemic stroke (right thalamic infarct 06/01/2021 and right basal ganglia hemorrhage 06/08/2021 ). He does have residual left sided deficits.   -Recent acute/small acute infarct in right midbrain on 05/19/2022  -CT Angiogram on 06/03/22 revealed Right Vertebral Artery Occlusion  -Per ER note from 05/19/2022:   I spoke to neurologist Dr. Mayford Knife.  They will call the scheduler to get the patient seen in neurology clinic expeditiously.  She stated he had been on dual antiplatelet therapy for the CHANCE protocol but he does not need to be on Plavix anymore so she advised him to stop this.  This was communicated to patient.  Careful return precautions provided.  Discharged home in good condition.   -Given these recommendations as noted by the ER physician, I advised that he HOLD taking his plavix    -However on further chart review, per Hospitalization in June 08, 2022 at [facility], Neurology had recommended to continue Plavix and stop aspirin? (This is per discharge summary by Dr. Enedina Finner on 06/19/2022)  -Refilled Atorvastatin, Continues Aspirin  -     atorvastatin (LIPITOR) 80 MG tablet; Take 1 tablet (80 mg total) by mouth every evening.  -He needs to follow up with Neurology,--there has been conflicting recommendations  regarding if he needs to be on DAPT therapy or not as well as what his anti-platelet therapy should be    Folliculitis barbae  -He appears to have pseudobarbus folliculitis. Will provide stronger topical corticosteroid  -     triamcinolone (KENALOG) 0.1 % cream; Apply topically two (2) times a day.    Rheumatoid arthritis   -Diagnosed in 1998  -Failed: Humira, Enbrel and Orencia, Xeljanz, Simponi. Additionally tried: Prednisone, Remicade infusion  -Currently taking: Methotrexate + Folic acid and B12, Simponi (every 28 days)  -He is actively receiving Tramadol 50mg  BID and Gabapentin 300mg  TID  -I have previously told him that I will not provide him pain management. He is to continue to follow with rheumatology. We have previously attempted to refer to pain management multiple times but have been unsuccessful    Encounter for medication monitoring  -He did bring in his medications today.   -We disposed a few of his old medications which were: Doxycycline, Glipizide, Sertraline 50mg  AND Sertraline 100mg , Seroquel 100mg  and fluoxetine 40mg      HEALTH MAINTENANCE  -Will discuss CRC screening next visit (colonoscopy vs cologuard). Colonoscopy was ordered on 11/14/2021  -He does meet criteria for LDCT. However deferred lung cancer screening in 12/2021  -Financial insecurity: referral to [facility] Request/Case Management and CAMP Placed last visit (mid July 2024)  -PSA 0.31 (11/14/2021)  -HIV negative, Hepatitis C negative (11/14/2021)        To follow up in 2 weeks to reassess his depression, PTSD. He was able to set up transportation services which will make it easier for him to follow up.        Subjective:     Paul Goos Sr. 57 y.o.male   is being seen for follow up     He walked to [employer] this morning   He brought in all of his medications   He does not want to take a medication   He does not want to talk to a therapist but states he does talk to hotlines    He has not been sleeping   He was able to contact transportation services and was able to be dropped off here   He wants to be able to walk/Run and ride his bike     ROS:     Review of systems negative unless otherwise noted as per HPI    Vital Signs:     Wt Readings from Last 3 Encounters:   09/07/22 78.2 kg (172 lb 4.8 oz)   05/16/22 81.6 kg (180 lb)   05/13/22 81.6 kg (180 lb)     Temp Readings from Last 3 Encounters:   09/07/22 36.1 ??C (97 ??F) (Temporal)   05/19/22 36.3 ??C (97.3 ??F)   05/16/22 36.8 ??C (98.2 ??F)     BP Readings from Last 3 Encounters:   09/07/22 128/86   08/31/22 127/89   05/19/22 120/99     Pulse Readings from Last 3 Encounters:   09/07/22 102   08/31/22 106   05/19/22 94       Objective:     Constitutional:       General: He is not in acute distress.     Appearance: He is not ill-appearing, toxic-appearing or diaphoretic.   Cardiac: Tachycardic but regular rhythm, no murmur on exam.   Pulmonary:      Effort: Pulmonary effort is normal. Lungs are relatively Clear to auscultation. NO Cough/wheeze/rhonchi  Musculoskeletal:      Comments: Steppage gait  present on exam. He has more AOM involving his left hand (still quite restricted and unable to perform full extension of his fingers.   Neurological:      Mental Status: He is oriented to person, place, and time.   Skin: Left lower anterior shin has healing ulcers. Hair on anterior shin is absent. He does appear to have pseudobarbus folliculitis. Onychomycosis on toes BUT feet do not have ulcers, cuts, or wounds present  Psychiatric:         Attention and Perception: He is attentive. He does not perceive auditory or visual hallucinations.         Mood and Affect: Mood is depressed. Affect is tearful. Affect is not angry.         Speech: Speech is slurred (dysarthria slightly worse from prior).         Behavior: Behavior is not aggressive or hyperactive. Behavior is cooperative.      Comments: He appears to be in better spirits compared to before.

## 2022-09-11 DIAGNOSIS — D509 Iron deficiency anemia, unspecified: Principal | ICD-10-CM

## 2022-09-11 MED ORDER — FERROUS SULFATE 325 MG (65 MG IRON) TABLET
ORAL_TABLET | Freq: Every day | ORAL | 3 refills | 30 days | Status: CP
Start: 2022-09-11 — End: 2023-09-11

## 2022-09-17 DIAGNOSIS — D509 Iron deficiency anemia, unspecified: Principal | ICD-10-CM

## 2022-09-17 MED ORDER — FERROUS SULFATE 325 MG (65 MG IRON) TABLET
ORAL_TABLET | Freq: Every day | ORAL | 1 refills | 0 days
Start: 2022-09-17 — End: ?

## 2022-09-18 MED ORDER — FERROUS SULFATE 325 MG (65 MG IRON) TABLET
ORAL_TABLET | Freq: Every day | ORAL | 0 refills | 90 days | Status: CP
Start: 2022-09-18 — End: 2023-09-18

## 2022-09-26 ENCOUNTER — Ambulatory Visit: Admit: 2022-09-26 | Discharge: 2022-09-27

## 2022-09-26 DIAGNOSIS — Z5986 Financial insecurity: Principal | ICD-10-CM

## 2022-09-26 DIAGNOSIS — Z87898 Personal history of other specified conditions: Principal | ICD-10-CM

## 2022-09-26 NOTE — Unmapped (Signed)
COMMUNITY HEALTH WORKER  Brief Contact Note    09/26/2022  Inform/Regulatory Low priority, no response needed unless change in care.     I am reaching out to Paul Hurst Sr. as part of the community health worker team regarding his Resource Coordination.    Patient advised to  reach out to [facility] if any resources are needed  .      Next appt with PCP: N/a     See chart for med list if needed    Sharing communication as part of regulatory requirements.     Assessment:    CHW contacted Patient for follow up regarding Transportation barriers  No additional needs identified  Spoke with Patient who states he did not call his medical insurance to inquire about transportation . [employer] Worker advised patient to call . [employer] Worker advised patient to reach out if nay resources are needed .  Food Insecurity: No Food Insecurity (06/19/2021)    Hunger Vital Sign     Worried About Running Out of Food in the Last Year: Never true     Ran Out of Food in the Last Year: Never true     Transportation Needs: No Transportation Needs (07/01/2021)    PRAPARE - Therapist, art (Medical): No     Lack of Transportation (Non-Medical): No     Financial Resource Strain: Low Risk  (06/19/2021)    Overall Financial Resource Strain (CARDIA)     Difficulty of Paying Living Expenses: Not very hard     Housing/Utilities: Low Risk  (06/19/2021)    Housing/Utilities     Within the past 12 months, have you ever stayed: outside, in a car, in a tent, in an overnight shelter, or temporarily in someone else's home (i.e. couch-surfing)?: No     Are you worried about losing your housing?: No     Within the past 12 months, have you been unable to get utilities (heat, electricity) when it was really needed?: No     Health Literacy: Low Risk  (07/01/2021)    Health Literacy     : Never      Social Connections: Unknown (08/29/2017)    Received from [facility]    Social Connection and Isolation Panel [NHANES]     Frequency of Communication with Friends and Family: Patient declined     Frequency of Social Gatherings with Friends and Family: Patient declined     Attends Religious Services: Patient declined     Database administrator or Organizations: Patient declined     Attends Banker Meetings: Patient declined     Marital Status: Divorced       The intervention was Education and Supportive listening     Patient expressed understanding.    Next Follow-up: No additional services needed    Note Routed: No.  Routed to:N/A    Signed: Paulla Fore, CNA

## 2022-09-29 DIAGNOSIS — Z8673 Personal history of transient ischemic attack (TIA), and cerebral infarction without residual deficits: Principal | ICD-10-CM

## 2022-09-29 DIAGNOSIS — I10 Essential (primary) hypertension: Principal | ICD-10-CM

## 2022-09-29 MED ORDER — GABAPENTIN 300 MG CAPSULE
ORAL_CAPSULE | Freq: Three times a day (TID) | ORAL | 1 refills | 0 days
Start: 2022-09-29 — End: ?

## 2022-10-02 MED ORDER — GABAPENTIN 300 MG CAPSULE
ORAL_CAPSULE | Freq: Three times a day (TID) | ORAL | 1 refills | 30 days | Status: CP
Start: 2022-10-02 — End: ?

## 2022-10-13 MED ORDER — METFORMIN 500 MG TABLET
ORAL_TABLET | Freq: Two times a day (BID) | ORAL | 2 refills | 30 days | Status: CP
Start: 2022-10-13 — End: 2023-01-11

## 2022-10-15 DIAGNOSIS — E119 Type 2 diabetes mellitus without complications: Principal | ICD-10-CM

## 2022-10-15 MED ORDER — ONETOUCH ULTRA TEST STRIPS
ORAL_STRIP | Freq: Every day | 1 refills | 0 days
Start: 2022-10-15 — End: ?

## 2022-10-17 MED ORDER — ONETOUCH ULTRA TEST STRIPS
ORAL_STRIP | Freq: Every day | 1 refills | 0 days | Status: CP
Start: 2022-10-17 — End: 2023-10-17

## 2022-10-20 NOTE — Unmapped (Signed)
Specialty Medication(s): SImponi    Paul Hurst has been dis-enrolled from the [facility] specialty pharmacy services due to multiple unsuccessful outreach attempts by the pharmacy.    Additional information provided to the patient: last filled 05/18/22 for 84 day supply. Notes from July state patient had lost insurance.     Julianne Rice, PharmD  [facility] Specialty Pharmacist

## 2022-10-23 DIAGNOSIS — F54 Psychological and behavioral factors associated with disorders or diseases classified elsewhere: Secondary | ICD-10-CM | POA: Diagnosis not present

## 2022-10-23 DIAGNOSIS — F4312 Post-traumatic stress disorder, chronic: Secondary | ICD-10-CM | POA: Diagnosis not present

## 2022-10-31 DIAGNOSIS — G2581 Restless legs syndrome: Principal | ICD-10-CM

## 2022-10-31 MED ORDER — CYCLOBENZAPRINE 10 MG TABLET
ORAL_TABLET | Freq: Three times a day (TID) | ORAL | 0 refills | 10 days | Status: CP | PRN
Start: 2022-10-31 — End: 2022-11-10

## 2022-11-01 DIAGNOSIS — M0579 Rheumatoid arthritis with rheumatoid factor of multiple sites without organ or systems involvement: Principal | ICD-10-CM

## 2022-11-01 MED ORDER — TRAMADOL 50 MG TABLET
ORAL_TABLET | Freq: Two times a day (BID) | ORAL | 3 refills | 30 days | Status: CP | PRN
Start: 2022-11-01 — End: ?

## 2022-11-07 ENCOUNTER — Institutional Professional Consult (permissible substitution)
Admit: 2022-11-07 | Discharge: 2022-11-08 | Payer: MEDICARE | Attending: Student in an Organized Health Care Education/Training Program | Primary: Student in an Organized Health Care Education/Training Program

## 2022-11-09 ENCOUNTER — Ambulatory Visit: Admit: 2022-11-09 | Discharge: 2022-11-10 | Payer: MEDICARE

## 2022-11-09 DIAGNOSIS — E639 Nutritional deficiency, unspecified: Principal | ICD-10-CM

## 2022-11-09 DIAGNOSIS — Z5982 Transportation insecurity: Principal | ICD-10-CM

## 2022-11-17 DIAGNOSIS — G2581 Restless legs syndrome: Principal | ICD-10-CM

## 2022-11-17 MED ORDER — CYCLOBENZAPRINE 10 MG TABLET
ORAL_TABLET | Freq: Three times a day (TID) | ORAL | 0 refills | 0 days | PRN
Start: 2022-11-17 — End: ?

## 2022-11-20 MED ORDER — CYCLOBENZAPRINE 10 MG TABLET
ORAL_TABLET | Freq: Three times a day (TID) | ORAL | 0 refills | 10 days | PRN
Start: 2022-11-20 — End: 2022-11-30

## 2022-11-21 ENCOUNTER — Ambulatory Visit: Admit: 2022-11-21 | Discharge: 2022-11-22 | Payer: MEDICARE

## 2022-11-22 DIAGNOSIS — Z7409 Other reduced mobility: Principal | ICD-10-CM

## 2022-11-22 DIAGNOSIS — Z8673 Personal history of transient ischemic attack (TIA), and cerebral infarction without residual deficits: Principal | ICD-10-CM

## 2022-11-22 DIAGNOSIS — Z9181 History of falling: Principal | ICD-10-CM

## 2022-11-22 DIAGNOSIS — I619 Nontraumatic intracerebral hemorrhage, unspecified: Principal | ICD-10-CM

## 2022-11-22 DIAGNOSIS — Z789 Other specified health status: Principal | ICD-10-CM

## 2022-11-22 DIAGNOSIS — Z5982 Transportation insecurity: Principal | ICD-10-CM

## 2022-11-23 DIAGNOSIS — I619 Nontraumatic intracerebral hemorrhage, unspecified: Principal | ICD-10-CM

## 2022-11-23 DIAGNOSIS — Z789 Other specified health status: Principal | ICD-10-CM

## 2022-11-23 DIAGNOSIS — Z7409 Other reduced mobility: Principal | ICD-10-CM

## 2022-11-23 DIAGNOSIS — Z9181 History of falling: Principal | ICD-10-CM

## 2022-11-23 DIAGNOSIS — Z5982 Transportation insecurity: Principal | ICD-10-CM

## 2022-11-24 DIAGNOSIS — G2581 Restless legs syndrome: Principal | ICD-10-CM

## 2022-11-24 MED ORDER — CYCLOBENZAPRINE 10 MG TABLET
ORAL_TABLET | Freq: Three times a day (TID) | ORAL | 0 refills | 10 days | Status: CP | PRN
Start: 2022-11-24 — End: 2022-12-04

## 2022-12-05 ENCOUNTER — Ambulatory Visit
Admit: 2022-12-05 | Discharge: 2022-12-05 | Payer: MEDICARE | Attending: Student in an Organized Health Care Education/Training Program | Primary: Student in an Organized Health Care Education/Training Program

## 2022-12-05 DIAGNOSIS — Z7409 Other reduced mobility: Principal | ICD-10-CM

## 2022-12-05 DIAGNOSIS — Z789 Other specified health status: Principal | ICD-10-CM

## 2022-12-05 DIAGNOSIS — I619 Nontraumatic intracerebral hemorrhage, unspecified: Principal | ICD-10-CM

## 2022-12-05 DIAGNOSIS — Z9181 History of falling: Principal | ICD-10-CM

## 2022-12-05 DIAGNOSIS — M069 Rheumatoid arthritis, unspecified: Principal | ICD-10-CM

## 2022-12-05 DIAGNOSIS — I10 Essential (primary) hypertension: Principal | ICD-10-CM

## 2022-12-05 DIAGNOSIS — F331 Major depressive disorder, recurrent, moderate: Principal | ICD-10-CM

## 2022-12-05 DIAGNOSIS — Z23 Encounter for immunization: Principal | ICD-10-CM

## 2022-12-05 DIAGNOSIS — Z8673 Personal history of transient ischemic attack (TIA), and cerebral infarction without residual deficits: Principal | ICD-10-CM

## 2022-12-05 MED ORDER — GABAPENTIN 300 MG CAPSULE
ORAL_CAPSULE | Freq: Three times a day (TID) | ORAL | 1 refills | 30 days | Status: CN
Start: 2022-12-05 — End: ?

## 2022-12-06 DIAGNOSIS — I10 Essential (primary) hypertension: Principal | ICD-10-CM

## 2022-12-06 DIAGNOSIS — Z8673 Personal history of transient ischemic attack (TIA), and cerebral infarction without residual deficits: Principal | ICD-10-CM

## 2022-12-06 MED ORDER — GABAPENTIN 300 MG CAPSULE
ORAL_CAPSULE | Freq: Three times a day (TID) | ORAL | 1 refills | 30 days
Start: 2022-12-06 — End: 2023-02-04

## 2022-12-11 DIAGNOSIS — G2581 Restless legs syndrome: Principal | ICD-10-CM

## 2022-12-11 MED ORDER — CYCLOBENZAPRINE 10 MG TABLET
ORAL_TABLET | Freq: Three times a day (TID) | ORAL | 0 refills | 10 days | PRN
Start: 2022-12-11 — End: 2022-12-21

## 2022-12-18 DIAGNOSIS — I639 Cerebral infarction, unspecified: Principal | ICD-10-CM

## 2022-12-18 DIAGNOSIS — E1165 Type 2 diabetes mellitus with hyperglycemia: Principal | ICD-10-CM

## 2022-12-20 ENCOUNTER — Emergency Department: Payer: Medicare HMO

## 2022-12-20 ENCOUNTER — Other Ambulatory Visit: Payer: Self-pay

## 2022-12-20 ENCOUNTER — Emergency Department
Admission: EM | Admit: 2022-12-20 | Discharge: 2022-12-21 | Disposition: A | Discharge status: Home / Self Care | Payer: Medicare HMO | Attending: Emergency Medicine | Admitting: Emergency Medicine

## 2022-12-20 DIAGNOSIS — S0181XA Laceration without foreign body of other part of head, initial encounter: Secondary | ICD-10-CM | POA: Diagnosis not present

## 2022-12-20 DIAGNOSIS — E119 Type 2 diabetes mellitus without complications: Secondary | ICD-10-CM | POA: Diagnosis not present

## 2022-12-20 DIAGNOSIS — Z8673 Personal history of transient ischemic attack (TIA), and cerebral infarction without residual deficits: Secondary | ICD-10-CM | POA: Diagnosis not present

## 2022-12-20 DIAGNOSIS — W050XXA Fall from non-moving wheelchair, initial encounter: Secondary | ICD-10-CM | POA: Diagnosis not present

## 2022-12-20 DIAGNOSIS — Z7902 Long term (current) use of antithrombotics/antiplatelets: Secondary | ICD-10-CM | POA: Diagnosis not present

## 2022-12-20 DIAGNOSIS — S0990XA Unspecified injury of head, initial encounter: Secondary | ICD-10-CM | POA: Diagnosis present

## 2022-12-20 NOTE — ED Triage Notes (Addendum)
Pt to ED via EMS from home, pt states he fell getting out of his wheelchair. Pt reports taking Plavix, pt has hematoma and small lac to right forehead. Pt has hx cva, left side weakness. Pt reports drinking half a pint of liquor tonight. Pt at baseline. Pt c/o head pain and right hand pain (related to RA).

## 2022-12-20 NOTE — ED Notes (Signed)
Patient is hitting call bell and repeatedly asking to go home.  Patient stops people in the hallway, asking to go home.  Patient has been reminded that he came by EMS, and that he has a hematoma on his forehead, and that we are still at this time waiting for the CT reads to come back from radiology.  He insists that he is fine, and his concern is that the longer he stays here, the more his insurance is being charged.  He has been reassured that his billing is not based on hours in the ED, but on visit and services.  He says his insurance will pay for his ride home, and he is calling them at this time to get a ride.  Provider has been made aware.

## 2022-12-20 NOTE — ED Triage Notes (Addendum)
EMS brings pt in from home; wheel-chair bound, hx CVA, +ETOH, fell out of w/c hitting his head, currently taking plavix; hematoma to rt side forehead/cheek; pt saturated in urine; assisted onto stretcher, clensed and changed into hosp gown; warm blankets provided

## 2022-12-21 DIAGNOSIS — G2581 Restless legs syndrome: Principal | ICD-10-CM

## 2022-12-21 MED ORDER — CYCLOBENZAPRINE 10 MG TABLET
ORAL_TABLET | Freq: Three times a day (TID) | ORAL | 0 refills | 10 days | PRN
Start: 2022-12-21 — End: 2022-12-31

## 2022-12-21 NOTE — ED Notes (Signed)
Patient head has been cleaned and the wound exposed for Dr. Rosalia Hammers to see more clearly.  The patient continues to insult this hospital and saying what a poor hospital this is.  He says that [facility] is much better, and would get him a cab to go home.    It was explained to him that cabs in [place] do not run at night, so that is not an option here.  He was unable to get a ride through his insurance company as he had said he could do earlier.  Patient says that he can get a cab home, and can pay for it himself.  Patient just wants to go home.  While cleaning his head, patient continued to gripe about poor service in the hospital.  Patient was told by this nurse, "I do not like you sir", when the patient asked what was being said about him after this nurse returned to the nurses station.  Patient has said he will call administration to which this nurse responded. "Good"

## 2022-12-21 NOTE — ED Provider Notes (Signed)
[facility] Provider Note    Event Date/Time   First MD Initiated Contact with Patient 12/20/22 2256     (approximate)   History   Fall   HPI  Patrick Perez is a 57 year old with history of CVA with left-sided hemiparesis, T2DM presenting to the emergency department for evaluation after a fall.  Patient reports that he was drinking alcohol earlier today and forgot to lock his wheelchair.  This caused him to fall forward and hit his head.  Denies LOC.  Noted to have small laceration, hemostatic, patient denies any other injuries.    Physical Exam   Triage Vital Signs: ED Triage Vitals  Encounter Vitals Group     BP 12/20/22 2018 (!) 130/91     Systolic BP Percentile --      Diastolic BP Percentile --      Pulse Rate 12/20/22 2018 92     Resp 12/20/22 2018 18     Temp 12/20/22 2018 98.2 F (36.8 C)     Temp src --      SpO2 12/20/22 2018 97 %     Weight 12/20/22 2017 140 lb (63.5 kg)     Height 12/20/22 2017 5\' 7"  (1.702 m)     Head Circumference --      Peak Flow --      Pain Score 12/20/22 2017 7     Pain Loc --      Pain Education --      Exclude from Growth Chart --     Most recent vital signs: Vitals:   12/20/22 2018  BP: (!) 130/91  Pulse: 92  Resp: 18  Temp: 98.2 F (36.8 C)  SpO2: 97%   Nursing notes and vital signs reviewed.  General: Adult male, laying in stretcher, awake and interactive Head: 2 cm superficial laceration over the right forehead, hemostatic, nongaping Chest: Symmetric chest rise, no tenderness to palpation.  Cardiac: Regular rhythm and rate.  Respiratory: Lungs clear to auscultation Abdomen: Soft, nondistended. No tenderness to palpation.  Pelvis: Stable in AP and lateral compression. No tenderness to palpation. MSK: No deformity to bilateral upper and lower extremity.   Neuro: Alert, oriented. GCS 15.  Deficits consistent with prior stroke.   Skin: No evidence of burns.  ED Results / Procedures /  Treatments   Labs (all labs ordered are listed, but only abnormal results are displayed) Labs Reviewed - No data to display   EKG EKG independently reviewed interpreted by myself (ER attending) demonstrates:    RADIOLOGY Imaging independently reviewed and interpreted by myself demonstrates:  CT head without acute bleed CT C-spine without acute fracture CT max face without acute fracture  PROCEDURES:  Critical Care performed: No  Procedures   MEDICATIONS ORDERED IN ED: Medications - No data to display   IMPRESSION / MDM / ASSESSMENT AND PLAN / ED COURSE  I reviewed the triage vital signs and the nursing notes.  Differential diagnosis includes, but is not limited to, intracranial bleed, skull fracture, no evidence of thoracoabdominal trauma  Patient's presentation is most consistent with acute presentation with potential threat to life or bodily function.  57 year old male presenting to the emergency department for evaluation after a fall.  Vital stable on presentation.  CT scans fortunately without acute injury.  Patient with baseline dysarthria and weakness with from prior stroke, but does appear clinically sober at this time.  Minimal gaping associated with forehead laceration, discussed potential closure with skin glue, but patient  declines.  Do think he is stable for discharge.  Strict return precautions provided.  Patient discharged stable condition.     FINAL CLINICAL IMPRESSION(S) / ED DIAGNOSES   Final diagnoses:  Closed head injury, initial encounter     Rx / DC Orders   ED Discharge Orders     None        Note:  This document was prepared using Dragon voice recognition software and may include unintentional dictation errors.   Trinna Post, MD 12/21/22 [phone]

## 2022-12-21 NOTE — ED Notes (Signed)
Patient is up an ambulatory in the hall.  He is unsteady on his feet due to medical conditions.  He has been reminded that he needs to remain seated.  However he continues to get up and walk around.

## 2022-12-21 NOTE — Discharge Instructions (Signed)
You were seen in the emergency department today for evaluation after a fall.  Your CT scans fortunately did not show serious injuries.  Drinking in excess.  Follow-up your primary care doctor for further evaluation.  Return to the ER for new or worsening symptoms.

## 2022-12-21 NOTE — ED Notes (Signed)
Patient managed to get a ride from a family member who is on the way to pick him up.  Patient has been dressed in paper scrubs and non-slip socks.

## 2022-12-25 DIAGNOSIS — G2581 Restless legs syndrome: Principal | ICD-10-CM

## 2022-12-25 MED ORDER — CYCLOBENZAPRINE 10 MG TABLET
ORAL_TABLET | Freq: Three times a day (TID) | ORAL | 0 refills | 10 days | Status: CP | PRN
Start: 2022-12-25 — End: 2023-01-04

## 2023-01-04 ENCOUNTER — Institutional Professional Consult (permissible substitution): Admit: 2023-01-04 | Discharge: 2023-01-05 | Payer: MEDICARE

## 2023-01-04 DIAGNOSIS — K5900 Constipation, unspecified: Principal | ICD-10-CM

## 2023-01-05 ENCOUNTER — Institutional Professional Consult (permissible substitution)
Admit: 2023-01-05 | Discharge: 2023-01-06 | Payer: MEDICARE | Attending: Student in an Organized Health Care Education/Training Program | Primary: Student in an Organized Health Care Education/Training Program

## 2023-01-05 DIAGNOSIS — F32A Anxiety and depression: Principal | ICD-10-CM

## 2023-01-05 DIAGNOSIS — K5900 Constipation, unspecified: Principal | ICD-10-CM

## 2023-01-05 DIAGNOSIS — F1010 Alcohol abuse, uncomplicated: Principal | ICD-10-CM

## 2023-01-05 DIAGNOSIS — F419 Anxiety disorder, unspecified: Principal | ICD-10-CM

## 2023-01-05 DIAGNOSIS — I619 Nontraumatic intracerebral hemorrhage, unspecified: Principal | ICD-10-CM

## 2023-01-05 DIAGNOSIS — Z7409 Other reduced mobility: Principal | ICD-10-CM

## 2023-01-05 DIAGNOSIS — Z789 Other specified health status: Principal | ICD-10-CM

## 2023-01-05 DIAGNOSIS — Z8673 Personal history of transient ischemic attack (TIA), and cerebral infarction without residual deficits: Principal | ICD-10-CM

## 2023-01-12 MED ORDER — METFORMIN 500 MG TABLET
ORAL_TABLET | 0 refills | 0 days
Start: 2023-01-12 — End: ?

## 2023-01-16 MED ORDER — METFORMIN 500 MG TABLET
ORAL_TABLET | 0 refills | 0 days
Start: 2023-01-16 — End: ?

## 2023-01-24 DIAGNOSIS — K029 Dental caries, unspecified: Principal | ICD-10-CM

## 2023-02-19 DIAGNOSIS — G2581 Restless legs syndrome: Principal | ICD-10-CM

## 2023-02-19 MED ORDER — CYCLOBENZAPRINE 10 MG TABLET
ORAL_TABLET | Freq: Three times a day (TID) | ORAL | 0 refills | 10.00 days | PRN
Start: 2023-02-19 — End: 2023-03-01

## 2024-02-28 DIAGNOSIS — K047 Periapical abscess without sinus: Principal | ICD-10-CM

## 2024-02-28 DIAGNOSIS — K029 Dental caries, unspecified: Principal | ICD-10-CM

## 2024-02-28 MED ORDER — AMOXICILLIN 875 MG TABLET
ORAL_TABLET | Freq: Two times a day (BID) | ORAL | 0 refills | 7.00000 days | Status: CP
Start: 2024-02-28 — End: ?
